# Patient Record
Sex: Male | Born: 1945 | Race: White | Hispanic: No | Marital: Married | Smoking: Former smoker
Health system: Southern US, Community
[De-identification: ages and names within clinical notes are randomized; demographics above are authoritative.]

## PROBLEM LIST (undated history)

## (undated) DIAGNOSIS — I10 Essential (primary) hypertension: Secondary | ICD-10-CM

## (undated) DIAGNOSIS — I4891 Unspecified atrial fibrillation: Secondary | ICD-10-CM

## (undated) DIAGNOSIS — R0600 Dyspnea, unspecified: Secondary | ICD-10-CM

## (undated) DIAGNOSIS — I499 Cardiac arrhythmia, unspecified: Secondary | ICD-10-CM

## (undated) DIAGNOSIS — Z9889 Other specified postprocedural states: Secondary | ICD-10-CM

## (undated) DIAGNOSIS — J45909 Unspecified asthma, uncomplicated: Secondary | ICD-10-CM

## (undated) DIAGNOSIS — F32A Depression, unspecified: Secondary | ICD-10-CM

## (undated) DIAGNOSIS — J449 Chronic obstructive pulmonary disease, unspecified: Secondary | ICD-10-CM

## (undated) DIAGNOSIS — R112 Nausea with vomiting, unspecified: Secondary | ICD-10-CM

## (undated) DIAGNOSIS — R609 Edema, unspecified: Secondary | ICD-10-CM

## (undated) DIAGNOSIS — E878 Other disorders of electrolyte and fluid balance, not elsewhere classified: Secondary | ICD-10-CM

## (undated) DIAGNOSIS — C61 Malignant neoplasm of prostate: Secondary | ICD-10-CM

## (undated) DIAGNOSIS — F419 Anxiety disorder, unspecified: Secondary | ICD-10-CM

## (undated) DIAGNOSIS — E119 Type 2 diabetes mellitus without complications: Secondary | ICD-10-CM

## (undated) DIAGNOSIS — I509 Heart failure, unspecified: Secondary | ICD-10-CM

## (undated) DIAGNOSIS — K219 Gastro-esophageal reflux disease without esophagitis: Secondary | ICD-10-CM

## (undated) DIAGNOSIS — M1990 Unspecified osteoarthritis, unspecified site: Secondary | ICD-10-CM

## (undated) HISTORY — DX: Type 2 diabetes mellitus without complications: E11.9

## (undated) HISTORY — DX: Anxiety disorder, unspecified: F41.9

## (undated) HISTORY — DX: Unspecified asthma, uncomplicated: J45.909

## (undated) HISTORY — DX: Cardiac arrhythmia, unspecified: I49.9

## (undated) HISTORY — PX: BACK SURGERY: SHX140

## (undated) HISTORY — DX: Unspecified atrial fibrillation: I48.91

## (undated) HISTORY — DX: Gastro-esophageal reflux disease without esophagitis: K21.9

## (undated) HISTORY — DX: Depression, unspecified: F32.A

---
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
## 2000-09-23 ENCOUNTER — Ambulatory Visit (HOSPITAL_COMMUNITY): Admission: RE | Admit: 2000-09-23 | Discharge: 2000-09-23 | Payer: Self-pay | Admitting: Family Medicine

## 2000-09-23 ENCOUNTER — Encounter: Payer: Self-pay | Admitting: Family Medicine

## 2002-12-01 ENCOUNTER — Ambulatory Visit (HOSPITAL_COMMUNITY): Admission: RE | Admit: 2002-12-01 | Discharge: 2002-12-01 | Payer: Self-pay | Admitting: Family Medicine

## 2006-02-02 ENCOUNTER — Ambulatory Visit (HOSPITAL_COMMUNITY): Admission: RE | Admit: 2006-02-02 | Discharge: 2006-02-02 | Payer: Self-pay | Admitting: Family Medicine

## 2006-12-30 ENCOUNTER — Ambulatory Visit (HOSPITAL_COMMUNITY): Admission: RE | Admit: 2006-12-30 | Discharge: 2006-12-30 | Payer: Self-pay | Admitting: Family Medicine

## 2010-10-28 LAB — CREATININE, SERUM
Creatinine, Ser: 1.32
GFR calc Af Amer: >60
GFR calc non Af Amer: 55 — ABNORMAL LOW

## 2011-06-02 ENCOUNTER — Other Ambulatory Visit (HOSPITAL_COMMUNITY): Payer: Self-pay | Admitting: Internal Medicine

## 2011-06-02 ENCOUNTER — Ambulatory Visit (HOSPITAL_COMMUNITY)
Admission: RE | Admit: 2011-06-02 | Discharge: 2011-06-02 | Disposition: A | Discharge status: Home / Self Care | Payer: Medicare Other | Source: Ambulatory Visit | Attending: Internal Medicine | Admitting: Internal Medicine

## 2011-06-02 DIAGNOSIS — X58XXXA Exposure to other specified factors, initial encounter: Secondary | ICD-10-CM | POA: Insufficient documentation

## 2011-06-02 DIAGNOSIS — R0781 Pleurodynia: Secondary | ICD-10-CM

## 2011-06-02 DIAGNOSIS — S2239XA Fracture of one rib, unspecified side, initial encounter for closed fracture: Secondary | ICD-10-CM | POA: Insufficient documentation

## 2011-06-02 DIAGNOSIS — J4489 Other specified chronic obstructive pulmonary disease: Secondary | ICD-10-CM | POA: Insufficient documentation

## 2011-06-02 DIAGNOSIS — J449 Chronic obstructive pulmonary disease, unspecified: Secondary | ICD-10-CM | POA: Insufficient documentation

## 2014-05-05 DIAGNOSIS — E782 Mixed hyperlipidemia: Secondary | ICD-10-CM | POA: Diagnosis not present

## 2014-05-05 DIAGNOSIS — I10 Essential (primary) hypertension: Secondary | ICD-10-CM | POA: Diagnosis not present

## 2014-05-05 DIAGNOSIS — E119 Type 2 diabetes mellitus without complications: Secondary | ICD-10-CM | POA: Diagnosis not present

## 2014-05-08 DIAGNOSIS — E119 Type 2 diabetes mellitus without complications: Secondary | ICD-10-CM | POA: Diagnosis not present

## 2014-05-08 DIAGNOSIS — J449 Chronic obstructive pulmonary disease, unspecified: Secondary | ICD-10-CM | POA: Diagnosis not present

## 2014-05-08 DIAGNOSIS — E782 Mixed hyperlipidemia: Secondary | ICD-10-CM | POA: Diagnosis not present

## 2014-05-08 DIAGNOSIS — I10 Essential (primary) hypertension: Secondary | ICD-10-CM | POA: Diagnosis not present

## 2014-08-17 DIAGNOSIS — I10 Essential (primary) hypertension: Secondary | ICD-10-CM | POA: Diagnosis not present

## 2014-08-17 DIAGNOSIS — E119 Type 2 diabetes mellitus without complications: Secondary | ICD-10-CM | POA: Diagnosis not present

## 2014-08-17 DIAGNOSIS — E782 Mixed hyperlipidemia: Secondary | ICD-10-CM | POA: Diagnosis not present

## 2014-08-22 DIAGNOSIS — E785 Hyperlipidemia, unspecified: Secondary | ICD-10-CM | POA: Diagnosis not present

## 2014-08-22 DIAGNOSIS — I10 Essential (primary) hypertension: Secondary | ICD-10-CM | POA: Diagnosis not present

## 2014-08-22 DIAGNOSIS — J449 Chronic obstructive pulmonary disease, unspecified: Secondary | ICD-10-CM | POA: Diagnosis not present

## 2014-08-22 DIAGNOSIS — E119 Type 2 diabetes mellitus without complications: Secondary | ICD-10-CM | POA: Diagnosis not present

## 2014-11-27 DIAGNOSIS — I10 Essential (primary) hypertension: Secondary | ICD-10-CM | POA: Diagnosis not present

## 2014-11-27 DIAGNOSIS — E119 Type 2 diabetes mellitus without complications: Secondary | ICD-10-CM | POA: Diagnosis not present

## 2014-11-27 DIAGNOSIS — E782 Mixed hyperlipidemia: Secondary | ICD-10-CM | POA: Diagnosis not present

## 2014-11-29 DIAGNOSIS — J449 Chronic obstructive pulmonary disease, unspecified: Secondary | ICD-10-CM | POA: Diagnosis not present

## 2014-11-29 DIAGNOSIS — E119 Type 2 diabetes mellitus without complications: Secondary | ICD-10-CM | POA: Diagnosis not present

## 2014-11-29 DIAGNOSIS — I10 Essential (primary) hypertension: Secondary | ICD-10-CM | POA: Diagnosis not present

## 2014-11-29 DIAGNOSIS — E782 Mixed hyperlipidemia: Secondary | ICD-10-CM | POA: Diagnosis not present

## 2014-11-29 DIAGNOSIS — Z23 Encounter for immunization: Secondary | ICD-10-CM | POA: Diagnosis not present

## 2015-03-27 DIAGNOSIS — E782 Mixed hyperlipidemia: Secondary | ICD-10-CM | POA: Diagnosis not present

## 2015-03-27 DIAGNOSIS — I10 Essential (primary) hypertension: Secondary | ICD-10-CM | POA: Diagnosis not present

## 2015-03-27 DIAGNOSIS — E119 Type 2 diabetes mellitus without complications: Secondary | ICD-10-CM | POA: Diagnosis not present

## 2015-03-29 DIAGNOSIS — E119 Type 2 diabetes mellitus without complications: Secondary | ICD-10-CM | POA: Diagnosis not present

## 2015-03-29 DIAGNOSIS — I10 Essential (primary) hypertension: Secondary | ICD-10-CM | POA: Diagnosis not present

## 2015-03-29 DIAGNOSIS — J449 Chronic obstructive pulmonary disease, unspecified: Secondary | ICD-10-CM | POA: Diagnosis not present

## 2015-03-29 DIAGNOSIS — R944 Abnormal results of kidney function studies: Secondary | ICD-10-CM | POA: Diagnosis not present

## 2015-04-26 DIAGNOSIS — I10 Essential (primary) hypertension: Secondary | ICD-10-CM | POA: Diagnosis not present

## 2015-05-29 DIAGNOSIS — J441 Chronic obstructive pulmonary disease with (acute) exacerbation: Secondary | ICD-10-CM | POA: Diagnosis not present

## 2015-05-29 DIAGNOSIS — J060 Acute laryngopharyngitis: Secondary | ICD-10-CM | POA: Diagnosis not present

## 2015-05-31 DIAGNOSIS — M549 Dorsalgia, unspecified: Secondary | ICD-10-CM | POA: Diagnosis not present

## 2015-05-31 DIAGNOSIS — M47816 Spondylosis without myelopathy or radiculopathy, lumbar region: Secondary | ICD-10-CM | POA: Diagnosis not present

## 2015-05-31 DIAGNOSIS — M4125 Other idiopathic scoliosis, thoracolumbar region: Secondary | ICD-10-CM | POA: Diagnosis not present

## 2015-06-04 DIAGNOSIS — M545 Low back pain: Secondary | ICD-10-CM | POA: Diagnosis not present

## 2015-06-04 DIAGNOSIS — M6281 Muscle weakness (generalized): Secondary | ICD-10-CM | POA: Diagnosis not present

## 2015-06-06 DIAGNOSIS — M545 Low back pain: Secondary | ICD-10-CM | POA: Diagnosis not present

## 2015-06-06 DIAGNOSIS — M6281 Muscle weakness (generalized): Secondary | ICD-10-CM | POA: Diagnosis not present

## 2015-06-13 DIAGNOSIS — M545 Low back pain: Secondary | ICD-10-CM | POA: Diagnosis not present

## 2015-06-13 DIAGNOSIS — M6281 Muscle weakness (generalized): Secondary | ICD-10-CM | POA: Diagnosis not present

## 2015-06-14 DIAGNOSIS — M545 Low back pain: Secondary | ICD-10-CM | POA: Diagnosis not present

## 2015-06-14 DIAGNOSIS — M6281 Muscle weakness (generalized): Secondary | ICD-10-CM | POA: Diagnosis not present

## 2015-06-20 DIAGNOSIS — M6281 Muscle weakness (generalized): Secondary | ICD-10-CM | POA: Diagnosis not present

## 2015-06-20 DIAGNOSIS — M545 Low back pain: Secondary | ICD-10-CM | POA: Diagnosis not present

## 2015-06-22 DIAGNOSIS — M545 Low back pain: Secondary | ICD-10-CM | POA: Diagnosis not present

## 2015-06-22 DIAGNOSIS — M6281 Muscle weakness (generalized): Secondary | ICD-10-CM | POA: Diagnosis not present

## 2015-06-25 DIAGNOSIS — M545 Low back pain: Secondary | ICD-10-CM | POA: Diagnosis not present

## 2015-06-25 DIAGNOSIS — M6281 Muscle weakness (generalized): Secondary | ICD-10-CM | POA: Diagnosis not present

## 2015-06-27 DIAGNOSIS — M6281 Muscle weakness (generalized): Secondary | ICD-10-CM | POA: Diagnosis not present

## 2015-06-27 DIAGNOSIS — M545 Low back pain: Secondary | ICD-10-CM | POA: Diagnosis not present

## 2015-07-02 DIAGNOSIS — M545 Low back pain: Secondary | ICD-10-CM | POA: Diagnosis not present

## 2015-07-02 DIAGNOSIS — M6281 Muscle weakness (generalized): Secondary | ICD-10-CM | POA: Diagnosis not present

## 2015-07-04 DIAGNOSIS — M6281 Muscle weakness (generalized): Secondary | ICD-10-CM | POA: Diagnosis not present

## 2015-07-04 DIAGNOSIS — M545 Low back pain: Secondary | ICD-10-CM | POA: Diagnosis not present

## 2015-07-05 DIAGNOSIS — M4125 Other idiopathic scoliosis, thoracolumbar region: Secondary | ICD-10-CM | POA: Diagnosis not present

## 2015-07-05 DIAGNOSIS — M47816 Spondylosis without myelopathy or radiculopathy, lumbar region: Secondary | ICD-10-CM | POA: Diagnosis not present

## 2015-07-17 DIAGNOSIS — M47816 Spondylosis without myelopathy or radiculopathy, lumbar region: Secondary | ICD-10-CM | POA: Diagnosis not present

## 2015-07-19 DIAGNOSIS — E119 Type 2 diabetes mellitus without complications: Secondary | ICD-10-CM | POA: Diagnosis not present

## 2015-07-19 DIAGNOSIS — E782 Mixed hyperlipidemia: Secondary | ICD-10-CM | POA: Diagnosis not present

## 2015-07-30 DIAGNOSIS — J449 Chronic obstructive pulmonary disease, unspecified: Secondary | ICD-10-CM | POA: Diagnosis not present

## 2015-07-30 DIAGNOSIS — E782 Mixed hyperlipidemia: Secondary | ICD-10-CM | POA: Diagnosis not present

## 2015-07-30 DIAGNOSIS — I10 Essential (primary) hypertension: Secondary | ICD-10-CM | POA: Diagnosis not present

## 2015-07-30 DIAGNOSIS — E119 Type 2 diabetes mellitus without complications: Secondary | ICD-10-CM | POA: Diagnosis not present

## 2015-08-06 DIAGNOSIS — M47816 Spondylosis without myelopathy or radiculopathy, lumbar region: Secondary | ICD-10-CM | POA: Diagnosis not present

## 2015-08-08 DIAGNOSIS — M1712 Unilateral primary osteoarthritis, left knee: Secondary | ICD-10-CM | POA: Diagnosis not present

## 2015-10-10 DIAGNOSIS — M47816 Spondylosis without myelopathy or radiculopathy, lumbar region: Secondary | ICD-10-CM | POA: Diagnosis not present

## 2015-10-10 DIAGNOSIS — M47817 Spondylosis without myelopathy or radiculopathy, lumbosacral region: Secondary | ICD-10-CM | POA: Diagnosis not present

## 2015-11-24 DIAGNOSIS — Z23 Encounter for immunization: Secondary | ICD-10-CM | POA: Diagnosis not present

## 2015-11-26 DIAGNOSIS — E782 Mixed hyperlipidemia: Secondary | ICD-10-CM | POA: Diagnosis not present

## 2015-11-26 DIAGNOSIS — I10 Essential (primary) hypertension: Secondary | ICD-10-CM | POA: Diagnosis not present

## 2015-11-26 DIAGNOSIS — E119 Type 2 diabetes mellitus without complications: Secondary | ICD-10-CM | POA: Diagnosis not present

## 2015-11-27 DIAGNOSIS — Z0001 Encounter for general adult medical examination with abnormal findings: Secondary | ICD-10-CM | POA: Diagnosis not present

## 2015-11-27 DIAGNOSIS — E782 Mixed hyperlipidemia: Secondary | ICD-10-CM | POA: Diagnosis not present

## 2015-11-27 DIAGNOSIS — R944 Abnormal results of kidney function studies: Secondary | ICD-10-CM | POA: Diagnosis not present

## 2015-11-27 DIAGNOSIS — J449 Chronic obstructive pulmonary disease, unspecified: Secondary | ICD-10-CM | POA: Diagnosis not present

## 2015-11-27 DIAGNOSIS — I10 Essential (primary) hypertension: Secondary | ICD-10-CM | POA: Diagnosis not present

## 2015-11-27 DIAGNOSIS — E119 Type 2 diabetes mellitus without complications: Secondary | ICD-10-CM | POA: Diagnosis not present

## 2016-03-31 DIAGNOSIS — I10 Essential (primary) hypertension: Secondary | ICD-10-CM | POA: Diagnosis not present

## 2016-03-31 DIAGNOSIS — Z1159 Encounter for screening for other viral diseases: Secondary | ICD-10-CM | POA: Diagnosis not present

## 2016-03-31 DIAGNOSIS — E119 Type 2 diabetes mellitus without complications: Secondary | ICD-10-CM | POA: Diagnosis not present

## 2016-04-02 DIAGNOSIS — R944 Abnormal results of kidney function studies: Secondary | ICD-10-CM | POA: Diagnosis not present

## 2016-04-02 DIAGNOSIS — I10 Essential (primary) hypertension: Secondary | ICD-10-CM | POA: Diagnosis not present

## 2016-04-02 DIAGNOSIS — J449 Chronic obstructive pulmonary disease, unspecified: Secondary | ICD-10-CM | POA: Diagnosis not present

## 2016-04-02 DIAGNOSIS — E782 Mixed hyperlipidemia: Secondary | ICD-10-CM | POA: Diagnosis not present

## 2016-04-02 DIAGNOSIS — E119 Type 2 diabetes mellitus without complications: Secondary | ICD-10-CM | POA: Diagnosis not present

## 2016-04-09 DIAGNOSIS — H25813 Combined forms of age-related cataract, bilateral: Secondary | ICD-10-CM | POA: Diagnosis not present

## 2016-04-09 DIAGNOSIS — H52223 Regular astigmatism, bilateral: Secondary | ICD-10-CM | POA: Diagnosis not present

## 2016-04-09 DIAGNOSIS — H5203 Hypermetropia, bilateral: Secondary | ICD-10-CM | POA: Diagnosis not present

## 2016-04-09 DIAGNOSIS — H524 Presbyopia: Secondary | ICD-10-CM | POA: Diagnosis not present

## 2016-07-02 DIAGNOSIS — E119 Type 2 diabetes mellitus without complications: Secondary | ICD-10-CM | POA: Diagnosis not present

## 2016-07-02 DIAGNOSIS — E782 Mixed hyperlipidemia: Secondary | ICD-10-CM | POA: Diagnosis not present

## 2016-07-04 DIAGNOSIS — J449 Chronic obstructive pulmonary disease, unspecified: Secondary | ICD-10-CM | POA: Diagnosis not present

## 2016-07-04 DIAGNOSIS — R944 Abnormal results of kidney function studies: Secondary | ICD-10-CM | POA: Diagnosis not present

## 2016-07-04 DIAGNOSIS — E782 Mixed hyperlipidemia: Secondary | ICD-10-CM | POA: Diagnosis not present

## 2016-07-04 DIAGNOSIS — E119 Type 2 diabetes mellitus without complications: Secondary | ICD-10-CM | POA: Diagnosis not present

## 2016-07-04 DIAGNOSIS — I10 Essential (primary) hypertension: Secondary | ICD-10-CM | POA: Diagnosis not present

## 2016-10-07 DIAGNOSIS — E119 Type 2 diabetes mellitus without complications: Secondary | ICD-10-CM | POA: Diagnosis not present

## 2016-10-07 DIAGNOSIS — I10 Essential (primary) hypertension: Secondary | ICD-10-CM | POA: Diagnosis not present

## 2016-10-15 DIAGNOSIS — I10 Essential (primary) hypertension: Secondary | ICD-10-CM | POA: Diagnosis not present

## 2016-10-15 DIAGNOSIS — R944 Abnormal results of kidney function studies: Secondary | ICD-10-CM | POA: Diagnosis not present

## 2016-10-15 DIAGNOSIS — E782 Mixed hyperlipidemia: Secondary | ICD-10-CM | POA: Diagnosis not present

## 2016-10-15 DIAGNOSIS — Z23 Encounter for immunization: Secondary | ICD-10-CM | POA: Diagnosis not present

## 2016-10-15 DIAGNOSIS — E119 Type 2 diabetes mellitus without complications: Secondary | ICD-10-CM | POA: Diagnosis not present

## 2016-11-21 DIAGNOSIS — J441 Chronic obstructive pulmonary disease with (acute) exacerbation: Secondary | ICD-10-CM | POA: Diagnosis not present

## 2016-11-26 DIAGNOSIS — I10 Essential (primary) hypertension: Secondary | ICD-10-CM | POA: Diagnosis not present

## 2017-02-12 DIAGNOSIS — E6609 Other obesity due to excess calories: Secondary | ICD-10-CM | POA: Diagnosis not present

## 2017-02-12 DIAGNOSIS — J449 Chronic obstructive pulmonary disease, unspecified: Secondary | ICD-10-CM | POA: Diagnosis not present

## 2017-02-12 DIAGNOSIS — R69 Illness, unspecified: Secondary | ICD-10-CM | POA: Diagnosis not present

## 2017-02-12 DIAGNOSIS — E119 Type 2 diabetes mellitus without complications: Secondary | ICD-10-CM | POA: Diagnosis not present

## 2017-02-12 DIAGNOSIS — R7301 Impaired fasting glucose: Secondary | ICD-10-CM | POA: Diagnosis not present

## 2017-02-12 DIAGNOSIS — I10 Essential (primary) hypertension: Secondary | ICD-10-CM | POA: Diagnosis not present

## 2017-02-12 DIAGNOSIS — Z7189 Other specified counseling: Secondary | ICD-10-CM | POA: Diagnosis not present

## 2017-02-12 DIAGNOSIS — G8929 Other chronic pain: Secondary | ICD-10-CM | POA: Diagnosis not present

## 2017-02-12 DIAGNOSIS — Z6830 Body mass index (BMI) 30.0-30.9, adult: Secondary | ICD-10-CM | POA: Diagnosis not present

## 2017-02-12 DIAGNOSIS — E782 Mixed hyperlipidemia: Secondary | ICD-10-CM | POA: Diagnosis not present

## 2017-02-12 DIAGNOSIS — R944 Abnormal results of kidney function studies: Secondary | ICD-10-CM | POA: Diagnosis not present

## 2017-02-16 DIAGNOSIS — E119 Type 2 diabetes mellitus without complications: Secondary | ICD-10-CM | POA: Diagnosis not present

## 2017-02-16 DIAGNOSIS — J449 Chronic obstructive pulmonary disease, unspecified: Secondary | ICD-10-CM | POA: Diagnosis not present

## 2017-02-16 DIAGNOSIS — E6609 Other obesity due to excess calories: Secondary | ICD-10-CM | POA: Diagnosis not present

## 2017-02-16 DIAGNOSIS — Z6830 Body mass index (BMI) 30.0-30.9, adult: Secondary | ICD-10-CM | POA: Diagnosis not present

## 2017-02-16 DIAGNOSIS — R7301 Impaired fasting glucose: Secondary | ICD-10-CM | POA: Diagnosis not present

## 2017-02-16 DIAGNOSIS — I10 Essential (primary) hypertension: Secondary | ICD-10-CM | POA: Diagnosis not present

## 2017-02-16 DIAGNOSIS — Z7189 Other specified counseling: Secondary | ICD-10-CM | POA: Diagnosis not present

## 2017-02-16 DIAGNOSIS — R944 Abnormal results of kidney function studies: Secondary | ICD-10-CM | POA: Diagnosis not present

## 2017-02-16 DIAGNOSIS — E782 Mixed hyperlipidemia: Secondary | ICD-10-CM | POA: Diagnosis not present

## 2017-02-16 DIAGNOSIS — G8929 Other chronic pain: Secondary | ICD-10-CM | POA: Diagnosis not present

## 2017-03-02 DIAGNOSIS — I10 Essential (primary) hypertension: Secondary | ICD-10-CM | POA: Diagnosis not present

## 2017-03-26 DIAGNOSIS — R05 Cough: Secondary | ICD-10-CM | POA: Diagnosis not present

## 2017-03-26 DIAGNOSIS — J449 Chronic obstructive pulmonary disease, unspecified: Secondary | ICD-10-CM | POA: Diagnosis not present

## 2017-04-14 DIAGNOSIS — R05 Cough: Secondary | ICD-10-CM | POA: Diagnosis not present

## 2017-04-14 DIAGNOSIS — J449 Chronic obstructive pulmonary disease, unspecified: Secondary | ICD-10-CM | POA: Diagnosis not present

## 2017-04-14 DIAGNOSIS — J441 Chronic obstructive pulmonary disease with (acute) exacerbation: Secondary | ICD-10-CM | POA: Diagnosis not present

## 2017-08-13 DIAGNOSIS — J441 Chronic obstructive pulmonary disease with (acute) exacerbation: Secondary | ICD-10-CM | POA: Diagnosis not present

## 2017-08-14 DIAGNOSIS — J449 Chronic obstructive pulmonary disease, unspecified: Secondary | ICD-10-CM | POA: Diagnosis not present

## 2017-08-28 DIAGNOSIS — E119 Type 2 diabetes mellitus without complications: Secondary | ICD-10-CM | POA: Diagnosis not present

## 2017-08-28 DIAGNOSIS — E782 Mixed hyperlipidemia: Secondary | ICD-10-CM | POA: Diagnosis not present

## 2017-08-28 DIAGNOSIS — I10 Essential (primary) hypertension: Secondary | ICD-10-CM | POA: Diagnosis not present

## 2017-08-28 DIAGNOSIS — J441 Chronic obstructive pulmonary disease with (acute) exacerbation: Secondary | ICD-10-CM | POA: Diagnosis not present

## 2017-08-28 DIAGNOSIS — R944 Abnormal results of kidney function studies: Secondary | ICD-10-CM | POA: Diagnosis not present

## 2017-09-02 DIAGNOSIS — R944 Abnormal results of kidney function studies: Secondary | ICD-10-CM | POA: Diagnosis not present

## 2017-09-02 DIAGNOSIS — E782 Mixed hyperlipidemia: Secondary | ICD-10-CM | POA: Diagnosis not present

## 2017-09-02 DIAGNOSIS — R945 Abnormal results of liver function studies: Secondary | ICD-10-CM | POA: Diagnosis not present

## 2017-09-02 DIAGNOSIS — I10 Essential (primary) hypertension: Secondary | ICD-10-CM | POA: Diagnosis not present

## 2017-09-02 DIAGNOSIS — E1169 Type 2 diabetes mellitus with other specified complication: Secondary | ICD-10-CM | POA: Diagnosis not present

## 2017-09-07 DIAGNOSIS — J449 Chronic obstructive pulmonary disease, unspecified: Secondary | ICD-10-CM | POA: Diagnosis not present

## 2017-09-14 DIAGNOSIS — J449 Chronic obstructive pulmonary disease, unspecified: Secondary | ICD-10-CM | POA: Diagnosis not present

## 2017-09-30 DIAGNOSIS — R945 Abnormal results of liver function studies: Secondary | ICD-10-CM | POA: Diagnosis not present

## 2017-09-30 DIAGNOSIS — E119 Type 2 diabetes mellitus without complications: Secondary | ICD-10-CM | POA: Diagnosis not present

## 2017-09-30 DIAGNOSIS — Z0001 Encounter for general adult medical examination with abnormal findings: Secondary | ICD-10-CM | POA: Diagnosis not present

## 2017-09-30 DIAGNOSIS — R944 Abnormal results of kidney function studies: Secondary | ICD-10-CM | POA: Diagnosis not present

## 2017-09-30 DIAGNOSIS — Z23 Encounter for immunization: Secondary | ICD-10-CM | POA: Diagnosis not present

## 2017-09-30 DIAGNOSIS — I10 Essential (primary) hypertension: Secondary | ICD-10-CM | POA: Diagnosis not present

## 2017-09-30 DIAGNOSIS — J449 Chronic obstructive pulmonary disease, unspecified: Secondary | ICD-10-CM | POA: Diagnosis not present

## 2017-09-30 DIAGNOSIS — R05 Cough: Secondary | ICD-10-CM | POA: Diagnosis not present

## 2017-10-05 ENCOUNTER — Other Ambulatory Visit (HOSPITAL_COMMUNITY): Payer: Self-pay | Admitting: Internal Medicine

## 2017-10-05 DIAGNOSIS — R945 Abnormal results of liver function studies: Secondary | ICD-10-CM

## 2017-10-06 DIAGNOSIS — J449 Chronic obstructive pulmonary disease, unspecified: Secondary | ICD-10-CM | POA: Diagnosis not present

## 2017-10-15 DIAGNOSIS — J449 Chronic obstructive pulmonary disease, unspecified: Secondary | ICD-10-CM | POA: Diagnosis not present

## 2017-10-20 ENCOUNTER — Ambulatory Visit (HOSPITAL_COMMUNITY)
Admission: RE | Admit: 2017-10-20 | Discharge: 2017-10-20 | Disposition: A | Discharge status: Home / Self Care | Payer: Medicare Other | Source: Ambulatory Visit | Attending: Internal Medicine | Admitting: Internal Medicine

## 2017-10-20 DIAGNOSIS — K8020 Calculus of gallbladder without cholecystitis without obstruction: Secondary | ICD-10-CM | POA: Diagnosis not present

## 2017-10-20 DIAGNOSIS — I700 Atherosclerosis of aorta: Secondary | ICD-10-CM | POA: Diagnosis not present

## 2017-10-20 DIAGNOSIS — E278 Other specified disorders of adrenal gland: Secondary | ICD-10-CM | POA: Insufficient documentation

## 2017-10-20 DIAGNOSIS — R945 Abnormal results of liver function studies: Secondary | ICD-10-CM | POA: Diagnosis not present

## 2017-10-20 LAB — POCT I-STAT CREATININE: Creatinine, Ser: 1 mg/dL (ref 0.61–1.24)

## 2017-10-20 MED ORDER — IOPAMIDOL (ISOVUE-300) INJECTION 61%
100.0000 mL | Freq: Once | INTRAVENOUS | Status: AC | PRN
  Administered 2017-10-20: 100 mL via INTRAVENOUS

## 2017-10-27 ENCOUNTER — Other Ambulatory Visit (HOSPITAL_COMMUNITY): Payer: Self-pay | Admitting: Internal Medicine

## 2017-10-27 DIAGNOSIS — E279 Disorder of adrenal gland, unspecified: Principal | ICD-10-CM

## 2017-10-27 DIAGNOSIS — E278 Other specified disorders of adrenal gland: Secondary | ICD-10-CM

## 2017-10-29 ENCOUNTER — Ambulatory Visit (HOSPITAL_COMMUNITY)
Admission: RE | Admit: 2017-10-29 | Discharge: 2017-10-29 | Disposition: A | Discharge status: Home / Self Care | Payer: Medicare Other | Source: Ambulatory Visit | Attending: Internal Medicine | Admitting: Internal Medicine

## 2017-10-29 ENCOUNTER — Encounter (HOSPITAL_COMMUNITY): Payer: Self-pay

## 2017-10-29 DIAGNOSIS — E279 Disorder of adrenal gland, unspecified: Principal | ICD-10-CM

## 2017-10-29 DIAGNOSIS — E278 Other specified disorders of adrenal gland: Secondary | ICD-10-CM

## 2017-11-02 ENCOUNTER — Other Ambulatory Visit: Payer: Self-pay | Admitting: Internal Medicine

## 2017-11-02 DIAGNOSIS — E278 Other specified disorders of adrenal gland: Secondary | ICD-10-CM

## 2017-11-02 DIAGNOSIS — E279 Disorder of adrenal gland, unspecified: Principal | ICD-10-CM

## 2017-11-05 DIAGNOSIS — J449 Chronic obstructive pulmonary disease, unspecified: Secondary | ICD-10-CM | POA: Diagnosis not present

## 2017-11-08 ENCOUNTER — Ambulatory Visit
Admission: RE | Admit: 2017-11-08 | Discharge: 2017-11-08 | Disposition: A | Discharge status: Home / Self Care | Payer: Medicare Other | Source: Ambulatory Visit | Attending: Internal Medicine | Admitting: Internal Medicine

## 2017-11-08 DIAGNOSIS — K8020 Calculus of gallbladder without cholecystitis without obstruction: Secondary | ICD-10-CM | POA: Diagnosis not present

## 2017-11-08 DIAGNOSIS — E278 Other specified disorders of adrenal gland: Secondary | ICD-10-CM

## 2017-11-08 DIAGNOSIS — E279 Disorder of adrenal gland, unspecified: Principal | ICD-10-CM

## 2017-11-08 MED ORDER — GADOBENATE DIMEGLUMINE 529 MG/ML IV SOLN
18.0000 mL | Freq: Once | INTRAVENOUS | Status: AC | PRN
  Administered 2017-11-08: 18 mL via INTRAVENOUS

## 2017-11-14 DIAGNOSIS — J449 Chronic obstructive pulmonary disease, unspecified: Secondary | ICD-10-CM | POA: Diagnosis not present

## 2017-12-04 DIAGNOSIS — J449 Chronic obstructive pulmonary disease, unspecified: Secondary | ICD-10-CM | POA: Diagnosis not present

## 2017-12-15 DIAGNOSIS — J449 Chronic obstructive pulmonary disease, unspecified: Secondary | ICD-10-CM | POA: Diagnosis not present

## 2017-12-28 DIAGNOSIS — E1169 Type 2 diabetes mellitus with other specified complication: Secondary | ICD-10-CM | POA: Diagnosis not present

## 2017-12-28 DIAGNOSIS — I10 Essential (primary) hypertension: Secondary | ICD-10-CM | POA: Diagnosis not present

## 2017-12-28 DIAGNOSIS — E782 Mixed hyperlipidemia: Secondary | ICD-10-CM | POA: Diagnosis not present

## 2017-12-28 DIAGNOSIS — R944 Abnormal results of kidney function studies: Secondary | ICD-10-CM | POA: Diagnosis not present

## 2018-01-05 DIAGNOSIS — E1169 Type 2 diabetes mellitus with other specified complication: Secondary | ICD-10-CM | POA: Diagnosis not present

## 2018-01-05 DIAGNOSIS — E782 Mixed hyperlipidemia: Secondary | ICD-10-CM | POA: Diagnosis not present

## 2018-01-05 DIAGNOSIS — I10 Essential (primary) hypertension: Secondary | ICD-10-CM | POA: Diagnosis not present

## 2018-01-05 DIAGNOSIS — R7301 Impaired fasting glucose: Secondary | ICD-10-CM | POA: Diagnosis not present

## 2018-01-07 DIAGNOSIS — J060 Acute laryngopharyngitis: Secondary | ICD-10-CM | POA: Diagnosis not present

## 2018-01-11 DIAGNOSIS — J449 Chronic obstructive pulmonary disease, unspecified: Secondary | ICD-10-CM | POA: Diagnosis not present

## 2018-01-14 DIAGNOSIS — J449 Chronic obstructive pulmonary disease, unspecified: Secondary | ICD-10-CM | POA: Diagnosis not present

## 2018-02-11 DIAGNOSIS — J449 Chronic obstructive pulmonary disease, unspecified: Secondary | ICD-10-CM | POA: Diagnosis not present

## 2018-02-14 DIAGNOSIS — J449 Chronic obstructive pulmonary disease, unspecified: Secondary | ICD-10-CM | POA: Diagnosis not present

## 2018-03-16 DIAGNOSIS — J449 Chronic obstructive pulmonary disease, unspecified: Secondary | ICD-10-CM | POA: Diagnosis not present

## 2018-03-17 DIAGNOSIS — J449 Chronic obstructive pulmonary disease, unspecified: Secondary | ICD-10-CM | POA: Diagnosis not present

## 2018-04-15 DIAGNOSIS — J449 Chronic obstructive pulmonary disease, unspecified: Secondary | ICD-10-CM | POA: Diagnosis not present

## 2018-05-14 DIAGNOSIS — J449 Chronic obstructive pulmonary disease, unspecified: Secondary | ICD-10-CM | POA: Diagnosis not present

## 2018-05-16 DIAGNOSIS — J449 Chronic obstructive pulmonary disease, unspecified: Secondary | ICD-10-CM | POA: Diagnosis not present

## 2018-05-18 DIAGNOSIS — E1169 Type 2 diabetes mellitus with other specified complication: Secondary | ICD-10-CM | POA: Diagnosis not present

## 2018-05-18 DIAGNOSIS — I10 Essential (primary) hypertension: Secondary | ICD-10-CM | POA: Diagnosis not present

## 2018-05-18 DIAGNOSIS — G894 Chronic pain syndrome: Secondary | ICD-10-CM | POA: Diagnosis not present

## 2018-05-18 DIAGNOSIS — E782 Mixed hyperlipidemia: Secondary | ICD-10-CM | POA: Diagnosis not present

## 2018-05-28 DIAGNOSIS — Z0000 Encounter for general adult medical examination without abnormal findings: Secondary | ICD-10-CM | POA: Diagnosis not present

## 2018-06-26 DIAGNOSIS — J449 Chronic obstructive pulmonary disease, unspecified: Secondary | ICD-10-CM | POA: Diagnosis not present

## 2018-12-08 ENCOUNTER — Other Ambulatory Visit: Payer: Self-pay

## 2018-12-08 NOTE — Patient Outreach (Signed)
[facility]) Care Management  12/08/2018  JABULANI FLEXER 1945-03-08 [id number]   Medication Adherence call to Mr. Zylan Marinaro [facility] Identifiers Verify spoke with patient he is past due on Atorvastatin 20 mg,patient explain he takes 1 tablet daily,patient was not home he has medication but will check and if needed he will order it. Mr. Geralyn Corwin is showing past due under [facility].   [place] Management Direct Dial [phone]  Fax [phone] [email or web address]

## 2018-12-14 DIAGNOSIS — E119 Type 2 diabetes mellitus without complications: Secondary | ICD-10-CM | POA: Diagnosis not present

## 2018-12-14 DIAGNOSIS — E1169 Type 2 diabetes mellitus with other specified complication: Secondary | ICD-10-CM | POA: Diagnosis not present

## 2018-12-14 DIAGNOSIS — I10 Essential (primary) hypertension: Secondary | ICD-10-CM | POA: Diagnosis not present

## 2018-12-14 DIAGNOSIS — E782 Mixed hyperlipidemia: Secondary | ICD-10-CM | POA: Diagnosis not present

## 2018-12-15 DIAGNOSIS — J449 Chronic obstructive pulmonary disease, unspecified: Secondary | ICD-10-CM | POA: Diagnosis not present

## 2018-12-15 DIAGNOSIS — E782 Mixed hyperlipidemia: Secondary | ICD-10-CM | POA: Diagnosis not present

## 2018-12-15 DIAGNOSIS — E1169 Type 2 diabetes mellitus with other specified complication: Secondary | ICD-10-CM | POA: Diagnosis not present

## 2018-12-15 DIAGNOSIS — R945 Abnormal results of liver function studies: Secondary | ICD-10-CM | POA: Diagnosis not present

## 2018-12-15 DIAGNOSIS — I10 Essential (primary) hypertension: Secondary | ICD-10-CM | POA: Diagnosis not present

## 2019-04-27 DIAGNOSIS — M25551 Pain in right hip: Secondary | ICD-10-CM | POA: Diagnosis not present

## 2019-05-12 ENCOUNTER — Inpatient Hospital Stay (HOSPITAL_COMMUNITY)
Admission: EM | Admit: 2019-05-12 | Discharge: 2019-05-14 | DRG: 310 | Disposition: A | Discharge status: Home / Self Care | Payer: Medicare Other | Attending: Internal Medicine | Admitting: Internal Medicine

## 2019-05-12 ENCOUNTER — Emergency Department (HOSPITAL_COMMUNITY): Payer: Medicare Other

## 2019-05-12 ENCOUNTER — Observation Stay (HOSPITAL_COMMUNITY): Payer: Medicare Other

## 2019-05-12 ENCOUNTER — Encounter (HOSPITAL_COMMUNITY): Payer: Self-pay

## 2019-05-12 ENCOUNTER — Other Ambulatory Visit: Payer: Self-pay

## 2019-05-12 DIAGNOSIS — M25551 Pain in right hip: Secondary | ICD-10-CM | POA: Diagnosis not present

## 2019-05-12 DIAGNOSIS — G8929 Other chronic pain: Secondary | ICD-10-CM | POA: Diagnosis present

## 2019-05-12 DIAGNOSIS — Z20822 Contact with and (suspected) exposure to covid-19: Secondary | ICD-10-CM | POA: Diagnosis present

## 2019-05-12 DIAGNOSIS — M48061 Spinal stenosis, lumbar region without neurogenic claudication: Secondary | ICD-10-CM | POA: Diagnosis present

## 2019-05-12 DIAGNOSIS — Z87891 Personal history of nicotine dependence: Secondary | ICD-10-CM | POA: Diagnosis not present

## 2019-05-12 DIAGNOSIS — Z8249 Family history of ischemic heart disease and other diseases of the circulatory system: Secondary | ICD-10-CM | POA: Diagnosis not present

## 2019-05-12 DIAGNOSIS — E785 Hyperlipidemia, unspecified: Secondary | ICD-10-CM | POA: Diagnosis present

## 2019-05-12 DIAGNOSIS — M549 Dorsalgia, unspecified: Secondary | ICD-10-CM

## 2019-05-12 DIAGNOSIS — Z79899 Other long term (current) drug therapy: Secondary | ICD-10-CM

## 2019-05-12 DIAGNOSIS — Z7189 Other specified counseling: Secondary | ICD-10-CM | POA: Diagnosis not present

## 2019-05-12 DIAGNOSIS — I4891 Unspecified atrial fibrillation: Principal | ICD-10-CM | POA: Diagnosis present

## 2019-05-12 DIAGNOSIS — M4807 Spinal stenosis, lumbosacral region: Secondary | ICD-10-CM | POA: Diagnosis present

## 2019-05-12 DIAGNOSIS — I071 Rheumatic tricuspid insufficiency: Secondary | ICD-10-CM | POA: Diagnosis not present

## 2019-05-12 DIAGNOSIS — J449 Chronic obstructive pulmonary disease, unspecified: Secondary | ICD-10-CM | POA: Diagnosis present

## 2019-05-12 DIAGNOSIS — I10 Essential (primary) hypertension: Secondary | ICD-10-CM | POA: Diagnosis present

## 2019-05-12 DIAGNOSIS — I272 Pulmonary hypertension, unspecified: Secondary | ICD-10-CM | POA: Diagnosis not present

## 2019-05-12 DIAGNOSIS — I340 Nonrheumatic mitral (valve) insufficiency: Secondary | ICD-10-CM | POA: Diagnosis not present

## 2019-05-12 HISTORY — DX: Other disorders of electrolyte and fluid balance, not elsewhere classified: E87.8

## 2019-05-12 HISTORY — DX: Chronic obstructive pulmonary disease, unspecified: J44.9

## 2019-05-12 HISTORY — DX: Unspecified osteoarthritis, unspecified site: M19.90

## 2019-05-12 HISTORY — DX: Essential (primary) hypertension: I10

## 2019-05-12 LAB — TROPONIN I (HIGH SENSITIVITY)
Troponin I (High Sensitivity): 7 ng/L (ref <18)
Troponin I (High Sensitivity): 8 ng/L (ref <18)

## 2019-05-12 LAB — CBC WITH DIFFERENTIAL/PLATELET
Abs Immature Granulocytes: 0.06 10*3/uL (ref 0.00–0.07)
Basophils Absolute: 0.1 10*3/uL (ref 0.0–0.1)
Basophils Relative: 1 %
Eosinophils Absolute: 0.2 10*3/uL (ref 0.0–0.5)
Eosinophils Relative: 2 %
HCT: 45.5 % (ref 39.0–52.0)
Hemoglobin: 14.6 g/dL (ref 13.0–17.0)
Immature Granulocytes: 1 %
Lymphocytes Relative: 10 %
Lymphs Abs: 1.1 10*3/uL (ref 0.7–4.0)
MCH: 31.1 pg (ref 26.0–34.0)
MCHC: 32.1 g/dL (ref 30.0–36.0)
MCV: 97 fL (ref 80.0–100.0)
Monocytes Absolute: 1.2 10*3/uL — ABNORMAL HIGH (ref 0.1–1.0)
Monocytes Relative: 12 %
Neutro Abs: 8.1 10*3/uL — ABNORMAL HIGH (ref 1.7–7.7)
Neutrophils Relative %: 74 %
Platelets: 195 10*3/uL (ref 150–400)
RBC: 4.69 MIL/uL (ref 4.22–5.81)
RDW: 15 % (ref 11.5–15.5)
WBC: 10.7 10*3/uL — ABNORMAL HIGH (ref 4.0–10.5)
nRBC: 0 % (ref 0.0–0.2)

## 2019-05-12 LAB — COMPREHENSIVE METABOLIC PANEL
ALT: 23 U/L (ref 0–44)
AST: 24 U/L (ref 15–41)
Albumin: 3.9 g/dL (ref 3.5–5.0)
Alkaline Phosphatase: 49 U/L (ref 38–126)
Anion gap: 12 (ref 5–15)
BUN: 23 mg/dL (ref 8–23)
CO2: 23 mmol/L (ref 22–32)
Calcium: 9.1 mg/dL (ref 8.9–10.3)
Chloride: 102 mmol/L (ref 98–111)
Creatinine, Ser: 1.1 mg/dL (ref 0.61–1.24)
GFR calc Af Amer: >60 mL/min (ref >60)
GFR calc non Af Amer: >60 mL/min (ref >60)
Glucose, Bld: 105 mg/dL — ABNORMAL HIGH (ref 70–99)
Potassium: 4.1 mmol/L (ref 3.5–5.1)
Sodium: 137 mmol/L (ref 135–145)
Total Bilirubin: 0.8 mg/dL (ref 0.3–1.2)
Total Protein: 7 g/dL (ref 6.5–8.1)

## 2019-05-12 LAB — MAGNESIUM: Magnesium: 2 mg/dL (ref 1.7–2.4)

## 2019-05-12 LAB — RESPIRATORY PANEL BY RT PCR (FLU A&B, COVID)
Influenza A by PCR: NEGATIVE
Influenza B by PCR: NEGATIVE
SARS Coronavirus 2 by RT PCR: NEGATIVE

## 2019-05-12 LAB — PROTIME-INR
INR: 1.2 (ref 0.8–1.2)
Prothrombin Time: 15.2 seconds (ref 11.4–15.2)

## 2019-05-12 LAB — TSH: TSH: 1.764 u[IU]/mL (ref 0.350–4.500)

## 2019-05-12 MED ORDER — ALBUTEROL SULFATE (2.5 MG/3ML) 0.083% IN NEBU: 3.0000 mL | INHALATION_SOLUTION | Freq: Four times a day (QID) | RESPIRATORY_TRACT | Status: DC | PRN

## 2019-05-12 MED ORDER — FLUTICASONE-UMECLIDIN-VILANT 100-62.5-25 MCG/INH IN AEPB
1.0000 | INHALATION_SPRAY | Freq: Every day | RESPIRATORY_TRACT | Status: DC
  Filled 2019-05-12 (×4): qty 1

## 2019-05-12 MED ORDER — ONDANSETRON HCL 4 MG/2ML IJ SOLN: 4.0000 mg | Freq: Four times a day (QID) | INTRAMUSCULAR | Status: DC | PRN

## 2019-05-12 MED ORDER — HYDROCODONE-ACETAMINOPHEN 5-325 MG PO TABS
1.0000 | ORAL_TABLET | Freq: Four times a day (QID) | ORAL | Status: DC | PRN
  Administered 2019-05-13 (×2): 1 via ORAL
  Filled 2019-05-12 (×3): qty 1

## 2019-05-12 MED ORDER — METOPROLOL TARTRATE 5 MG/5ML IV SOLN: 5.0000 mg | INTRAVENOUS | Status: DC | PRN

## 2019-05-12 MED ORDER — APIXABAN 5 MG PO TABS
5.0000 mg | ORAL_TABLET | Freq: Two times a day (BID) | ORAL | Status: DC
  Administered 2019-05-12 – 2019-05-14 (×3): 5 mg via ORAL
  Filled 2019-05-12 (×5): qty 1

## 2019-05-12 MED ORDER — DILTIAZEM HCL-DEXTROSE 125-5 MG/125ML-% IV SOLN (PREMIX)
5.0000 mg/h | INTRAVENOUS | Status: DC
  Administered 2019-05-12: 5 mg/h via INTRAVENOUS
  Filled 2019-05-12: qty 125

## 2019-05-12 MED ORDER — POLYETHYLENE GLYCOL 3350 17 G PO PACK: 17.0000 g | PACK | Freq: Every day | ORAL | Status: DC | PRN

## 2019-05-12 MED ORDER — DILTIAZEM HCL 30 MG PO TABS
30.0000 mg | ORAL_TABLET | Freq: Once | ORAL | Status: AC
  Administered 2019-05-12: 30 mg via ORAL
  Filled 2019-05-12: qty 1

## 2019-05-12 MED ORDER — HYDROCODONE-ACETAMINOPHEN 5-325 MG PO TABS
1.0000 | ORAL_TABLET | Freq: Once | ORAL | Status: AC
  Administered 2019-05-12: 1 via ORAL
  Filled 2019-05-12: qty 1

## 2019-05-12 MED ORDER — METHOCARBAMOL 500 MG PO TABS
500.0000 mg | ORAL_TABLET | Freq: Three times a day (TID) | ORAL | Status: AC
  Administered 2019-05-12 – 2019-05-13 (×4): 500 mg via ORAL
  Filled 2019-05-12 (×4): qty 1

## 2019-05-12 MED ORDER — DILTIAZEM HCL 25 MG/5ML IV SOLN
10.0000 mg | Freq: Once | INTRAVENOUS | Status: AC
  Administered 2019-05-12: 14:00:00 10 mg via INTRAVENOUS
  Filled 2019-05-12: qty 5

## 2019-05-12 MED ORDER — ATORVASTATIN CALCIUM 20 MG PO TABS
20.0000 mg | ORAL_TABLET | Freq: Every day | ORAL | Status: DC
  Administered 2019-05-13 – 2019-05-14 (×2): 20 mg via ORAL
  Filled 2019-05-12 (×2): qty 1

## 2019-05-12 MED ORDER — DILTIAZEM HCL-DEXTROSE 125-5 MG/125ML-% IV SOLN (PREMIX)
5.0000 mg/h | INTRAVENOUS | Status: DC
  Administered 2019-05-12: 5 mg/h via INTRAVENOUS
  Administered 2019-05-13: 10 mg/h via INTRAVENOUS
  Filled 2019-05-12: qty 125

## 2019-05-12 MED ORDER — ACETAMINOPHEN 650 MG RE SUPP: 650.0000 mg | Freq: Four times a day (QID) | RECTAL | Status: DC | PRN

## 2019-05-12 MED ORDER — ACETAMINOPHEN 325 MG PO TABS: 650.0000 mg | ORAL_TABLET | Freq: Four times a day (QID) | ORAL | Status: DC | PRN

## 2019-05-12 MED ORDER — DILTIAZEM HCL 30 MG PO TABS
60.0000 mg | ORAL_TABLET | Freq: Three times a day (TID) | ORAL | Status: DC
  Administered 2019-05-12: 60 mg via ORAL
  Filled 2019-05-12: qty 2

## 2019-05-12 MED ORDER — LORAZEPAM 2 MG/ML IJ SOLN
0.5000 mg | Freq: Once | INTRAMUSCULAR | Status: AC
  Administered 2019-05-12: 0.5 mg via INTRAVENOUS
  Filled 2019-05-12: qty 1

## 2019-05-12 MED ORDER — ONDANSETRON HCL 4 MG PO TABS: 4.0000 mg | ORAL_TABLET | Freq: Four times a day (QID) | ORAL | Status: DC | PRN

## 2019-05-12 NOTE — ED Provider Notes (Signed)
Emergency Department Provider Note   I have reviewed the triage vital signs and the nursing notes.   HISTORY  Chief Complaint Hip Pain   HPI Bryan Hamilton is a 74 y.o. male with PMH of COPD, HLD, and HTN presents to the ED with right hip pain.  Patient states that 2 weeks ago he was trying to get up into a lifted truck when he felt a pulling pain in the back of his right hip/buttocks area.  He notes pain with walking.  He has been taking Tylenol arthritis formulation along with topical pain medicines with only temporary relief.  He called his primary care doctor today who referred him to the emergency department to be evaluated and potentially have an x-ray.  He denies any falls.  He is not having pain in his knee or ankle.  He denies any other symptoms. Patient denies any back pain. Occasionally noted a "numbness" in the area that does not radiate and lasts only minutes. No sensation like that currently. No shooting pain or numbness down the leg. No groin numbness. No bowel/bladder symptoms.   In triage the patient's heart rate is elevated and he was placed on monitor.  He is found to be in atrial fibrillation with RVR.  He has normal blood pressure.  He denies any symptoms of heart palpitations, chest pain, shortness of breath.  He denies any known history of atrial fibrillation.  He cannot recall the names of his home medications.   Past Medical History:  Diagnosis Date  . Arthritis   . COPD (chronic obstructive pulmonary disease) ([place])   . Hyperchloremia   . Hypertension     Patient Active Problem List   Diagnosis Date Noted  . New onset atrial fibrillation ([place]) 05/12/2019    Past Surgical History:  Procedure Laterality Date  . BACK SURGERY      Allergies Patient has no known allergies.  History reviewed. No pertinent family history.  Social History Social History   Tobacco Use  . Smoking status: Former Research scientist (life sciences)  . Smokeless tobacco: Never Used  Substance Use  Topics  . Alcohol use: Yes    Comment: occ  . Drug use: Never    Review of Systems  Constitutional: No fever/chills Eyes: No visual changes. ENT: No sore throat. Cardiovascular: Denies chest pain. Respiratory: Denies shortness of breath. Gastrointestinal: No abdominal pain.  No nausea, no vomiting.  No diarrhea.  No constipation. Genitourinary: Negative for dysuria. Musculoskeletal: Negative for back pain. Positive right hip pain.  Skin: Negative for rash. Neurological: Negative for headaches, focal weakness or numbness.  10-point ROS otherwise negative.  ____________________________________________   PHYSICAL EXAM:  VITAL SIGNS: ED Triage Vitals  Enc Vitals Group     BP 05/12/19 1337 (!) 133/98     Pulse Rate 05/12/19 1337 (!) 109     Resp 05/12/19 1337 20     Temp 05/12/19 1337 97.9 F (36.6 C)     Temp Source 05/12/19 1337 Tympanic     SpO2 05/12/19 1337 96 %     Weight 05/12/19 1339 212 lb (96.2 kg)     Height 05/12/19 1339 5\' 8"  (1.727 m)   Constitutional: Alert and oriented. Well appearing and in no acute distress. Eyes: Conjunctivae are normal. Head: Atraumatic. Nose: No congestion/rhinnorhea. Mouth/Throat: Mucous membranes are moist.  Neck: No stridor.   Cardiovascular: A-fib with RVR. Good peripheral circulation. Grossly normal heart sounds.   Respiratory: Normal respiratory effort.  No retractions. Lungs CTAB. Gastrointestinal:  Soft and nontender. No distention.  Musculoskeletal: Normal ROM of the right hip. No tenderness over the right knee or ankle.  Neurologic:  Normal speech and language. No gross focal neurologic deficits are appreciated.  Skin:  Skin is warm, dry and intact. No rash noted.  ____________________________________________   LABS (all labs ordered are listed, but only abnormal results are displayed)  Labs Reviewed  COMPREHENSIVE METABOLIC PANEL - Abnormal; Notable for the following components:      Result Value   Glucose, Bld 105  (*)    All other components within normal limits  CBC WITH DIFFERENTIAL/PLATELET - Abnormal; Notable for the following components:   WBC 10.7 (*)    Neutro Abs 8.1 (*)    Monocytes Absolute 1.2 (*)    All other components within normal limits  RESPIRATORY PANEL BY RT PCR (FLU A&B, COVID)  MRSA PCR SCREENING  PROTIME-INR  TSH  MAGNESIUM  TROPONIN I (HIGH SENSITIVITY)  TROPONIN I (HIGH SENSITIVITY)   ____________________________________________  EKG   EKG Interpretation  Date/Time:  Thursday May 12 2019 13:54:50 EDT Ventricular Rate:  139 PR Interval:    QRS Duration: 103 QT Interval:  309 QTC Calculation: 470 R Axis:   -104 Text Interpretation: Atrial fibrillation with RVR Left anterior fascicular block Low voltage, precordial leads Abnormal R-wave progression, late transition No STEMI Confirmed by Nanda Quinton [phone]) on 05/12/2019 2:17:55 PM       ____________________________________________  RADIOLOGY  DG Lumbar Spine 2-3 Views  Result Date: 05/12/2019 CLINICAL DATA:  74 year old male with right hip pain. EXAM: LUMBAR SPINE - 2-3 VIEW COMPARISON:  CT abdomen pelvis dated 10/20/2017. FINDINGS: Five lumbar type vertebra. There is no acute fracture or subluxation of the lumbar spine. Advanced multilevel degenerative changes with disc space narrowing, endplate irregularity, and osteophyte/spurring. Chronic appearing compression fracture of L2. There is resulting lumbar scoliosis. The visualized posterior elements are intact. Multilevel facet arthropathy. There is atherosclerotic calcification of the aorta. The aorta appears ectatic. IMPRESSION: 1. No acute fracture or subluxation. 2. 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 Electronically Signed   By: Anner Crete M.D.   On: 05/12/2019 18:55   DG Chest Portable 1 View  Result Date: 05/12/2019 CLINICAL DATA:  Atrial fibrillation. EXAM: PORTABLE CHEST 1 VIEW COMPARISON:  Jun 02, 2011. FINDINGS: The  heart size and mediastinal contours are within normal limits. No pneumothorax or pleural effusion is noted. Mild left basilar atelectasis or infiltrate is noted. The visualized skeletal structures are unremarkable. IMPRESSION: Mild left basilar atelectasis or infiltrate is noted. Electronically Signed   By: Marijo Conception M.D.   On: 05/12/2019 14:37   DG Hip Unilat W or Wo Pelvis 2-3 Views Right  Result Date: 05/12/2019 CLINICAL DATA:  Right hip pain, began after getting into a lifted truck 2 weeks prior. EXAM: DG HIP (WITH OR WITHOUT PELVIS) 2-3V RIGHT COMPARISON:  None. FINDINGS: No acute bony abnormality. Specifically, no fracture, subluxation, or dislocation. No abnormal diastatic widening of the SI joints or symphysis pubis. Femoral heads are normally located. Moderate degenerative changes noted in the lower lumbar spine with milder changes at the SI joints and symphysis pubis. Monitoring leads overlie the included portions of the abdomen and pelvis. Normal bowel gas pattern. Vascular calcium noted in the medial thighs. Soft tissues otherwise unremarkable. IMPRESSION: No acute bony abnormality. Degenerative changes in the lower lumbar spine, hips and SI joints. Electronically Signed   By: Lovena Le M.D.   On: 05/12/2019 15:20  ____________________________________________   PROCEDURES  Procedure(s) performed:   .Critical Care Performed by: Margette Fast, MD Authorized by: Margette Fast, MD   Critical care provider statement:    Critical care time (minutes):  45   Critical care time was exclusive of:  Separately billable procedures and treating other patients and teaching time   Critical care was necessary to treat or prevent imminent or life-threatening deterioration of the following conditions:  Circulatory failure (A-fib with RVR)   Critical care was time spent personally by me on the following activities:  Discussions with consultants, evaluation of patient's response to  treatment, examination of patient, ordering and performing treatments and interventions, ordering and review of laboratory studies, ordering and review of radiographic studies, pulse oximetry, re-evaluation of patient's condition, obtaining history from patient or surrogate, review of old charts, blood draw for specimens and development of treatment plan with patient or surrogate   I assumed direction of critical care for this patient from another provider in my specialty: no      ____________________________________________   INITIAL IMPRESSION / ASSESSMENT AND PLAN / ED COURSE  Pertinent labs & imaging results that were available during my care of the patient were reviewed by me and considered in my medical decision making (see chart for details).   Patient presents emergency department for evaluation of right hip pain.  Pain is been present for the past 2 weeks.  Normal range of motion of the joint.  No concern for septic joint clinically.  Plan for plain films of the right hip.  He has no red flag signs or symptoms to suspect acute spine emergency.  No neuro deficits on exam.  Of note, during triage vitals the patient was found to be tachycardic.  EKG shows atrial fibrillation with RVR.  He is asymptomatic in terms of his A. fib symptoms as far as I can tell.  He is not a candidate for ED cardioversion in the setting.  He questions whether or not he is on a blood thinner.  It is possible that this has been diagnosed previously.  I will ask the pharmacy tech to call the patient's pharmacy to get a med list which may help direct further intervention for his A. fib.  Plan for IV diltiazem push and p.o. medication to try and avoid diltiazem infusion. Hold on anticoagulation until med list received.   Labs pending. Plain films reviewed. Care transferred to Dr. Rogene Houston pending reassess after attempt at rate control.  ____________________________________________  FINAL CLINICAL IMPRESSION(S) / ED  DIAGNOSES  Final diagnoses:  Atrial fibrillation with RVR (HCC)  Right hip pain     MEDICATIONS GIVEN DURING THIS VISIT:  Medications  HYDROcodone-acetaminophen (NORCO/VICODIN) 5-325 MG per tablet 1 tablet (has no administration in time range)  atorvastatin (LIPITOR) tablet 20 mg (has no administration in time range)  albuterol (PROVENTIL) (2.5 MG/3ML) 0.083% nebulizer solution 3 mL (has no administration in time range)  Fluticasone-Umeclidin-Vilant 100-62.5-25 MCG/INH AEPB 1 puff (has no administration in time range)  methocarbamol (ROBAXIN) tablet 500 mg (500 mg Oral Given 05/12/19 2204)  acetaminophen (TYLENOL) tablet 650 mg (has no administration in time range)    Or  acetaminophen (TYLENOL) suppository 650 mg (has no administration in time range)  polyethylene glycol (MIRALAX / GLYCOLAX) packet 17 g (has no administration in time range)  ondansetron (ZOFRAN) tablet 4 mg (has no administration in time range)    Or  ondansetron (ZOFRAN) injection 4 mg (has no administration in time range)  apixaban (ELIQUIS) tablet 5 mg (5 mg Oral Given 05/12/19 1815)  diltiazem (CARDIZEM) 125 mg in dextrose 5% 125 mL (1 mg/mL) infusion (10 mg/hr Intravenous New Bag/Given 05/13/19 0615)  Chlorhexidine Gluconate Cloth 2 % PADS 6 each (has no administration in time range)  diltiazem (CARDIZEM) injection 10 mg (10 mg Intravenous Given 05/12/19 1424)  diltiazem (CARDIZEM) tablet 30 mg (30 mg Oral Given 05/12/19 1425)  LORazepam (ATIVAN) injection 0.5 mg (0.5 mg Intravenous Given 05/12/19 1439)  HYDROcodone-acetaminophen (NORCO/VICODIN) 5-325 MG per tablet 1 tablet (1 tablet Oral Given 05/12/19 1812)     Note:  This document was prepared using Dragon voice recognition software and may include unintentional dictation errors.  Nanda Quinton, MD, [facility] Emergency Medicine    Harlyn Rathmann, [facility], MD 05/13/19 [id number]

## 2019-05-12 NOTE — ED Notes (Signed)
Leanord Hawking [phone] Daughter To be contacted wife unable

## 2019-05-12 NOTE — H&P (Addendum)
History and Physical    Bryan Hamilton [id number] DOB: Apr 13, 1945 DOA: 05/12/2019  PCP: Celene Squibb, MD   Patient coming from: Home  I have personally briefly reviewed patient's old medical records in [place]  Chief Complaint: Back pain  HPI: Bryan Hamilton is a 74 y.o. male with medical history significant for COPD, hypertension.  Patient presented to the ED with complaints of right hip and back pain that started 2 weeks ago.  He was trying to climb into a lifted truck when he felt a pull in his back that radiated to his right hip and down his right leg.  No abnormal sensation in his lower extremities, no lower extremity weakness.  At baseline he ambulates with a cane, but with onset of pain he has been ambulating with a walker too. Pain has been persistent, he has tried Tylenol without significant relief of pain.  He has chronic back pain and has had back surgeries in the past. He denies palpitations, he is unaware of his heart rate or rhythm, he denies chest pain, he has chronic unchanged difficulty breathing.  No cough.  No fever or chills.  He denies falls.  No history of GI bleeds.  No history of heart problems.  He very occasionally drinks alcohol maybe once a month.  ED Course: Found to be in atrial fibrillation rates up to 130s.  Blood pressure 130s to 160s, chest x-ray showing mild left base atelectasis/infiltrate.  X-ray shows degenerative changes in lower lumbar spine hips and SI joint.  Potassium 4.1.  Troponin 7 > 8.  Given IV Cardizem 10 mg and 10 p.o. Cardizem 30 mg with initial plans to send patient home, but with heart rate persisting in the 110s to 120s, hospitalist was called to admit.  Review of Systems: As per HPI all other systems reviewed and negative.  Past Medical History:  Diagnosis Date  . Arthritis   . COPD (chronic obstructive pulmonary disease) ([place])   . Hyperchloremia   . Hypertension     Past Surgical History:  Procedure Laterality  Date  . BACK SURGERY       reports that he has quit smoking. He has never used smokeless tobacco. He reports current alcohol use. He reports that he does not use drugs.  No Known Allergies  Family history of hypertension.  Prior to Admission medications   Medication Sig Start Date End Date Taking? Authorizing Provider  acetaminophen (TYLENOL) 650 MG CR tablet Take 1,300 mg by mouth 2 (two) times daily as needed for pain.   Yes [provider]  atorvastatin (LIPITOR) 20 MG tablet Take 1 tablet by mouth daily. 03/07/19  Yes [provider]  lisinopril (ZESTRIL) 10 MG tablet Take 20 mg by mouth daily.  03/18/19  Yes [provider]  Omega-3 Fatty Acids (OMEGA 3 PO) Take 6 capsules by mouth daily.   Yes [provider]  Polyethyl Glycol-Propyl Glycol (SYSTANE) 0.4-0.3 % SOLN Place 1-2 drops into both eyes daily as needed.   Yes [provider]  PROAIR HFA 108 (90 Base) MCG/ACT inhaler Inhale 2 puffs into the lungs every 6 (six) hours as needed. 03/31/19  Yes [provider]  TRELEGY ELLIPTA 100-62.5-25 MCG/INH AEPB Inhale 1 puff into the lungs daily. 03/31/19  Yes [provider]    Physical Exam: Vitals:   05/12/19 1521 05/12/19 1600 05/12/19 1630 05/12/19 1700  BP: (!) 142/82 (!) 131/94 (!) 150/107 (!) 143/109  Pulse: (!) 116 Marland Kitchen)  117 93 (!) 109  Resp: (!) 22 17 20 12   Temp:      TempSrc:      SpO2: 96% 96% 96% 95%  Weight:      Height:        Constitutional: NAD, calm, comfortable Vitals:   05/12/19 1521 05/12/19 1600 05/12/19 1630 05/12/19 1700  BP: (!) 142/82 (!) 131/94 (!) 150/107 (!) 143/109  Pulse: (!) 116 (!) 117 93 (!) 109  Resp: (!) 22 17 20 12   Temp:      TempSrc:      SpO2: 96% 96% 96% 95%  Weight:      Height:       Eyes: PERRL, lids and conjunctivae normal ENMT: Mucous membranes are moist.  Neck: normal, supple, no masses, no thyromegaly Respiratory:  Normal respiratory effort. No accessory muscle  use.   Cardiovascular: Tachycardic, irregular rate and rhythm, No extremity edema. 2+ pedal pulses. No carotid bruits.  Abdomen: no tenderness, no masses palpated. No hepatosplenomegaly. Bowel sounds positive.  Musculoskeletal: no clubbing / cyanosis. No joint deformity upper and lower extremities. Good ROM, no contractures. Normal muscle tone.  Skin: no rashes, lesions, ulcers. No induration Neurologic: No apparent cranial nerve abnormality, no facial asymmetry, speech is clear and fluent, 5/5 strength in bilateral upper and lower extremities. Psychiatric: Normal judgment and insight. Alert and oriented x 3. Normal mood.   Labs on Admission: I have personally reviewed following labs and imaging studies  CBC: Recent Labs  Lab 05/12/19 1423  WBC 10.7*  NEUTROABS 8.1*  HGB 14.6  HCT 45.5  MCV 97.0  PLT 0000000   Basic Metabolic Panel: Recent Labs  Lab 05/12/19 1423  NA 137  K 4.1  CL 102  CO2 23  GLUCOSE 105*  BUN 23  CREATININE 1.10  CALCIUM 9.1  MG 2.0   Liver Function Tests: Recent Labs  Lab 05/12/19 1423  AST 24  ALT 23  ALKPHOS 49  BILITOT 0.8  PROT 7.0  ALBUMIN 3.9   Coagulation Profile: Recent Labs  Lab 05/12/19 1423  INR 1.2   Radiological Exams on Admission: DG Chest Portable 1 View  Result Date: 05/12/2019 CLINICAL DATA:  Atrial fibrillation. EXAM: PORTABLE CHEST 1 VIEW COMPARISON:  Jun 02, 2011. FINDINGS: The heart size and mediastinal contours are within normal limits. No pneumothorax or pleural effusion is noted. Mild left basilar atelectasis or infiltrate is noted. The visualized skeletal structures are unremarkable. IMPRESSION: Mild left basilar atelectasis or infiltrate is noted. Electronically Signed   By: Marijo Conception M.D.   On: 05/12/2019 14:37   DG Hip Unilat W or Wo Pelvis 2-3 Views Right  Result Date: 05/12/2019 CLINICAL DATA:  Right hip pain, began after getting into a lifted truck 2 weeks prior. EXAM: DG HIP (WITH OR WITHOUT PELVIS) 2-3V  RIGHT COMPARISON:  None. FINDINGS: No acute bony abnormality. Specifically, no fracture, subluxation, or dislocation. No abnormal diastatic widening of the SI joints or symphysis pubis. Femoral heads are normally located. Moderate degenerative changes noted in the lower lumbar spine with milder changes at the SI joints and symphysis pubis. Monitoring leads overlie the included portions of the abdomen and pelvis. Normal bowel gas pattern. Vascular calcium noted in the medial thighs. Soft tissues otherwise unremarkable. IMPRESSION: No acute bony abnormality. Degenerative changes in the lower lumbar spine, hips and SI joints. Electronically Signed   By: Lovena Le M.D.   On: 05/12/2019 15:20    EKG: Independently reviewed.  Atrial fibrillation rate  118, QTc 454.  LAFB.  No prior EKG to compare.  Assessment/Plan Active Problems:   New onset atrial fibrillation ([place])  New onset atrial fibrillation- asymptomatic, hence unknown duration.  Rate is on my evaluation 90s to 120s.  Normal potassium 4.1.  No prior echo on file.  No history of heart disease.  Troponin unremarkable.  Denies history of frequent falls or GI bleed.  Risk and benefits of anticoagulation explained to patient and his grandson at bedside.  Agreed to start anticoagulation. -Obtain echocardiogram -Anticoagulation with Eliquis - continue oral Cardizem at 60 mg every 8 hourly -IV metoprolol 5 mg daily 5 minutes for heart rate greater than 110 -Check TSH, magnesium. -Cardiology consult - addendum- Heart rates now up to 140s, will start cardizem drip. D/c Po cardizem. Admit to step down.   Acute on chronic back pain-pelvis x-ray negative for acute abnormality, shows degenerative changes in the lower lumbar spine.  Pain radiating down right lower extremity, no focal neurologic deficits. -Obtain lumbar x-ray -Methocarbamol 500 TID x 5 doses. -Norco 5/325 every 6 hourly PRN - PT eval  COPD-stable unchanged dyspnea, no cough.  Chest x-ray  mild left base atelectasis versus infiltrate.  Afebrile, WBC 10.7. -Resume home bronchodilators/inhalers  Hypertension-blood pressure stable. -Hold lisinopril 20mg  daily to allow for adjustment of rate limiting medications.   DVT prophylaxis: Eliquis Code Status: Full code Family Communication: Grandson at bedside, Donna Christen. Disposition Plan: Pending improvement in heart rate. Consults called: cardiology Admission status: Inpt, Stepdown.  At the time of admission, it appears that the appropriate admission status for this patient is INPATIENT. This is judged to be reasonable and necessary in order to provide the required intensity of service to ensure the patient's safety given the presenting symptoms, physical exam findings, and initial radiographic and laboratory data in the context of their  Comorbid conditions.   Patient requires inpatient status due to high intensity of service, high risk for further deterioration and high frequency of surveillance required.   I certify that at the point of admission it is my clinical judgment that the patient will require inpatient hospital care spanning beyond 2 midnights.     Bethena Roys MD [facility]  05/12/2019, 5:54 PM

## 2019-05-12 NOTE — ED Triage Notes (Signed)
Pt c/o right hip pain. States pain began 2 weeks ago when pt was attempting to get into a lifted truck. Pt states he felt a pulling sensation in back and then pain began in hip.   PCP recommended x-ray.

## 2019-05-12 NOTE — ED Provider Notes (Signed)
Patient rapid ventricular rate atrial fibrillation did not hold with the oral diltiazem.  So patient be started on diltiazem drip.  Patient still stable.  Patient in no acute distress.  Discussed with the hospitalist they will admit.  Cardiology is available for consult tomorrow.  Patient's Covid testing ordered.  CRITICAL CARE Performed by: Fredia Sorrow Total critical care time: 35 minutes Critical care time was exclusive of separately billable procedures and treating other patients. Critical care was necessary to treat or prevent imminent or life-threatening deterioration. Critical care was time spent personally by me on the following activities: development of treatment plan with patient and/or surrogate as well as nursing, discussions with consultants, evaluation of patient's response to treatment, examination of patient, obtaining history from patient or surrogate, ordering and performing treatments and interventions, ordering and review of laboratory studies, ordering and review of radiographic studies, pulse oximetry and re-evaluation of patient's condition.   Fredia Sorrow, MD 05/12/19 [id number]

## 2019-05-12 NOTE — ED Notes (Signed)
Have notified Dr. Rogene Houston of increased HR upon standing to use urinal.

## 2019-05-12 NOTE — Progress Notes (Signed)
ANTICOAGULATION CONSULT NOTE - Initial Consult  Pharmacy Consult for Apixaban Indication: atrial fibrillation  No Known Allergies  Patient Measurements: Height: 5\' 8"  (172.7 cm) Weight: 96.2 kg (212 lb) IBW/kg (Calculated) : 68.4  Vital Signs: Temp: 97.9 F (36.6 C) (04/22 1337) Temp Source: Tympanic (04/22 1337) BP: 155/101 (04/22 1730) Pulse Rate: 101 (04/22 1730)   Assessment: Patient is new onset AFib and not on any anticoagulation prior to admit.  He has been receiving Diltiazem for rate control.  Pharmacy has been asked to initiate Apixaban for him. Lab Review:  Stable H/H (no noted bleeding), INR 1.2, Screat. 1.2 and an estimated crcl of 59ml/min. Criteria for dosing reviewed: 1)  Age >/= 80 years 2)  Weight </= 60 kg 3)  Serum Creatinine >/= 1.5 Drug/Drug Interaction reviewed: Apixaban and Diltiazem Pharmacologic effects and plasma concentrations of apixaban may be increased by moderate CYP3A4 inhibitors (e.g. Moderate CYP3A4 Inhibitors).   Plan:   - Will begin Apixaban 5mg  twice daily  - Monitor for s/s of bleeding   - Provide education prior to discharge for this new medication.  Rober Minion, PharmD., MS Clinical Pharmacist Thank you for allowing pharmacy to be part of this patients care team.   Labs: Recent Labs    05/12/19 1423 05/12/19 1537  HGB 14.6  --   HCT 45.5  --   PLT 195  --   LABPROT 15.2  --   INR 1.2  --   CREATININE 1.10  --   TROPONINIHS 7 8   Estimated Creatinine Clearance: 64.2 mL/min (by C-G formula based on SCr of 1.1 mg/dL).  Medical History: Past Medical History:  Diagnosis Date  . Arthritis   . COPD (chronic obstructive pulmonary disease) ([place])   . Hyperchloremia   . Hypertension     05/12/2019,6:02 PM

## 2019-05-13 ENCOUNTER — Encounter (HOSPITAL_COMMUNITY): Payer: Self-pay | Admitting: Internal Medicine

## 2019-05-13 ENCOUNTER — Inpatient Hospital Stay (HOSPITAL_COMMUNITY): Payer: Medicare Other

## 2019-05-13 DIAGNOSIS — I4891 Unspecified atrial fibrillation: Principal | ICD-10-CM

## 2019-05-13 DIAGNOSIS — M25551 Pain in right hip: Secondary | ICD-10-CM

## 2019-05-13 DIAGNOSIS — Z7189 Other specified counseling: Secondary | ICD-10-CM

## 2019-05-13 DIAGNOSIS — E785 Hyperlipidemia, unspecified: Secondary | ICD-10-CM

## 2019-05-13 DIAGNOSIS — I340 Nonrheumatic mitral (valve) insufficiency: Secondary | ICD-10-CM

## 2019-05-13 DIAGNOSIS — I10 Essential (primary) hypertension: Secondary | ICD-10-CM

## 2019-05-13 LAB — MRSA PCR SCREENING: MRSA by PCR: NEGATIVE

## 2019-05-13 LAB — ECHOCARDIOGRAM COMPLETE
Height: 68 in
Weight: 3294.55 oz

## 2019-05-13 MED ORDER — DILTIAZEM HCL 60 MG PO TABS
60.0000 mg | ORAL_TABLET | Freq: Four times a day (QID) | ORAL | Status: DC
  Administered 2019-05-13 – 2019-05-14 (×5): 60 mg via ORAL
  Filled 2019-05-13 (×5): qty 1

## 2019-05-13 MED ORDER — UMECLIDINIUM BROMIDE 62.5 MCG/INH IN AEPB
1.0000 | INHALATION_SPRAY | Freq: Every day | RESPIRATORY_TRACT | Status: DC
  Administered 2019-05-13 – 2019-05-14 (×2): 1 via RESPIRATORY_TRACT
  Filled 2019-05-13: qty 7

## 2019-05-13 MED ORDER — FLUTICASONE FUROATE-VILANTEROL 100-25 MCG/INH IN AEPB
1.0000 | INHALATION_SPRAY | Freq: Every day | RESPIRATORY_TRACT | Status: DC
  Administered 2019-05-13 – 2019-05-14 (×2): 1 via RESPIRATORY_TRACT
  Filled 2019-05-13: qty 28

## 2019-05-13 MED ORDER — CHLORHEXIDINE GLUCONATE CLOTH 2 % EX PADS
6.0000 | MEDICATED_PAD | Freq: Every day | CUTANEOUS | Status: DC
  Administered 2019-05-13 – 2019-05-14 (×2): 6 via TOPICAL

## 2019-05-13 NOTE — Plan of Care (Signed)
  Problem: Acute Rehab PT Goals(only PT should resolve) Goal: Pt Will Go Supine/Side To Sit Outcome: Progressing Flowsheets (Taken 05/13/2019 0942) Pt will go Supine/Side to Sit:  with min guard assist  with supervision Goal: Patient Will Transfer Sit To/From Stand Outcome: Progressing Flowsheets (Taken 05/13/2019 [phone]) Patient will transfer sit to/from stand: with modified independence Goal: Pt Will Transfer Bed To Chair/Chair To Bed Outcome: Progressing Flowsheets (Taken 05/13/2019 0942) Pt will Transfer Bed to Chair/Chair to Bed: with modified independence Goal: Pt Will Ambulate Outcome: Progressing Flowsheets (Taken 05/13/2019 0942) Pt will Ambulate:  100 feet  with modified independence  with rolling walker   9:43 AM, 05/13/19 Lonell Grandchild, MPT Physical Therapist with [facility] [phone] office [phone] mobile phone

## 2019-05-13 NOTE — Care Management (Signed)
Patient recommended for outpatient PT. Discussed with patient he is not interested in a referral at this time.

## 2019-05-13 NOTE — Consult Note (Addendum)
Cardiology Consult    Patient ID: Bryan Hamilton; [id number]; 1945/03/27   Admit date: 05/12/2019 Date of Consult: 05/13/2019  Primary Care Provider: Celene Squibb, MD Primary Cardiologist: New to [facility] - Dr. Bronson Ing  Patient Profile    Bryan Hamilton is a 74 y.o. male with past medical history of HTN, HLD and COPD who is being seen today for the evaluation of new-onset atrial fibrillation at the request of Dr. Denton Brick.   History of Present Illness    Bryan Hamilton presented to on 05/12/2019 for evaluation of pain along his right hip which started 2 weeks prior when climbing into a lifted truck. He was taking Tylenol and using topical pain medication without improvement. HR was elevated on arrival to the ED and upon being connected to the monitor this showed atrial fibrillation with RVR. In talking with the patient today, he denies any recent chest pain or palpitations. Has baseline dyspnea on exertion in the setting of COPD but denies any recent orthopnea, PND or lower extremity edema. No known personal history of CAD, CHF or arrhythmias. No known family history of cardiac issues. 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   Initial labs showed WBC 10.7, Hgb 14.6, platelets 195, Na+ 137, K+ 4.1 and creatinine 1.10. TSH 1.764. Mg 2.0. Initial HS Troponin 7 with repeat of 8. COVID negative. CXR showed mild left basilar atelectasis or infiltrate. Imaging of the hip showed no acute bony abnormalities but was noted to have degenerative changes. EKG showed atrial fibrillation with RVR, HR 139 with no prior tracings available for comparison.   He was initially started on IV Lopressor and PO Cardizem but HR remained elevated in the 140's, therefore he was started on Cardizem drip on admission. Risks and benefits of anticoagulation were reviewed and he was started on Eliquis 5 mg twice daily.  He is currently on IV Cardizem at 10 mg/hr and oral tablets have  already been ordered to begin at 60mg  Q6H. PTA Lisinopril is currently held to allow for titration of AV nodal blocking agents.    Past Medical History:  Diagnosis Date  . Arthritis   . COPD (chronic obstructive pulmonary disease) ([place])   . Hyperchloremia   . Hypertension     Past Surgical History:  Procedure Laterality Date  . BACK SURGERY       Home Medications:  Prior to Admission medications   Medication Sig Start Date End Date Taking? Authorizing Provider  acetaminophen (TYLENOL) 650 MG CR tablet Take 1,300 mg by mouth 2 (two) times daily as needed for pain.   Yes [provider]  atorvastatin (LIPITOR) 20 MG tablet Take 1 tablet by mouth daily. 03/07/19  Yes [provider]  lisinopril (ZESTRIL) 10 MG tablet Take 20 mg by mouth daily.  03/18/19  Yes [provider]  Omega-3 Fatty Acids (OMEGA 3 PO) Take 6 capsules by mouth daily.   Yes [provider]  Polyethyl Glycol-Propyl Glycol (SYSTANE) 0.4-0.3 % SOLN Place 1-2 drops into both eyes daily as needed.   Yes [provider]  PROAIR HFA 108 (90 Base) MCG/ACT inhaler Inhale 2 puffs into the lungs every 6 (six) hours as needed. 03/31/19  Yes [provider]  TRELEGY ELLIPTA 100-62.5-25 MCG/INH AEPB Inhale 1 puff into the lungs daily. 03/31/19  Yes [provider]    Inpatient Medications: Scheduled Meds: . apixaban  5 mg Oral BID  . atorvastatin  20 mg Oral Daily  . Chlorhexidine Gluconate Cloth  6 each Topical Daily  . diltiazem  60 mg Oral Q6H  . fluticasone furoate-vilanterol  1 puff Inhalation Daily  . methocarbamol  500 mg Oral TID  . umeclidinium bromide  1 puff Inhalation Daily   Continuous Infusions: . diltiazem (CARDIZEM) infusion 10 mg/hr (05/13/19 0615)   PRN Meds: acetaminophen **OR** acetaminophen, albuterol, HYDROcodone-acetaminophen, ondansetron **OR** ondansetron (ZOFRAN) IV, polyethylene glycol  Allergies:   No Known Allergies  Social  History:   Social History   Socioeconomic History  . Marital status: Married    Spouse name: Not on file  . Number of children: Not on file  . Years of education: Not on file  . Highest education level: Not on file  Occupational History  . Not on file  Tobacco Use  . Smoking status: Former Research scientist (life sciences)  . Smokeless tobacco: Never Used  Substance and Sexual Activity  . Alcohol use: Yes    Comment: occ  . Drug use: Never  . Sexual activity: Not on file  Other Topics Concern  . Not on file  Social History Narrative  . Not on file   Social Determinants of Health   Financial Resource Strain:   . Difficulty of Paying Living Expenses:   Food Insecurity:   . Worried About Charity fundraiser in the Last Year:   . Arboriculturist in the Last Year:   Transportation Needs:   . Film/video editor (Medical):   Marland Kitchen Lack of Transportation (Non-Medical):   Physical Activity:   . Days of Exercise per Week:   . Minutes of Exercise per Session:   Stress:   . Feeling of Stress :   Social Connections:   . Frequency of Communication with Friends and Family:   . Frequency of Social Gatherings with Friends and Family:   . Attends Religious Services:   . Active Member of Clubs or Organizations:   . Attends Archivist Meetings:   Marland Kitchen Marital Status:   Intimate Partner Violence:   . Fear of Current or Ex-Partner:   . Emotionally Abused:   Marland Kitchen Physically Abused:   . Sexually Abused:      Family History:    Family History  Problem Relation Age of Onset  . Cancer Father       Review of Systems    General:  No chills, fever, night sweats or weight changes. Positive for right hip pain.  Cardiovascular:  No chest pain, dyspnea on exertion, edema, orthopnea, palpitations, paroxysmal nocturnal dyspnea. Dermatological: No rash, lesions/masses Respiratory: No cough, dyspnea Urologic: No hematuria, dysuria Abdominal:   No nausea, vomiting, diarrhea, bright red blood per rectum,  melena, or hematemesis Neurologic:  No visual changes, wkns, changes in mental status. All other systems reviewed and are otherwise negative except as noted above.  Physical Exam/Data    Vitals:   05/13/19 0500 05/13/19 0700 05/13/19 0715 05/13/19 0730  BP:  136/79 (!) 104/58 (!) 127/94  Pulse:  (!) 103 (!) 108 86  Resp:  15 17 (!) 27  Temp:      TempSrc:      SpO2:  95% 96% 96%  Weight: 93.4 kg     Height:        Intake/Output Summary (Last 24 hours) at 05/13/2019 0855 Last data filed at 05/13/2019 0500 Gross per 24 hour  Intake 17.12 ml  Output 500 ml  Net -482.88 ml   [employer]  05/12/19 1339 05/12/19 2128 05/13/19 0500  Weight: 96.2 kg 93.4 kg 93.4 kg   Body mass index is 31.31 kg/m.   General: Pleasant, male appearing in NAD Psych: Normal affect. Neuro: Alert and oriented X 3. Moves all extremities spontaneously. HEENT: Normal  Neck: Supple without bruits or JVD. Lungs:  Resp regular and unlabored, CTA without wheezing or rales. Heart: Irregularly irregular. no s3, s4, or murmurs. Abdomen: Soft, non-tender, non-distended, BS + x 4.  Extremities: No clubbing or cyanosis. Trace lower extremity edema. DP/PT/Radials 2+ and equal bilaterally.   EKG:  The EKG was personally reviewed and demonstrates: Atrial fibrillation with RVR, HR 139 with no prior tracings available for comparison.   Telemetry:  Telemetry was personally reviewed and demonstrates: Atrial fibrillation, HR mostly in 80's to 90's, briefly peaking into the 120's. Occasional PVC's.    Labs/Studies     Relevant CV Studies:  Echocardiogram: Pending  Laboratory Data:  Chemistry Recent Labs  Lab 05/12/19 1423  NA 137  K 4.1  CL 102  CO2 23  GLUCOSE 105*  BUN 23  CREATININE 1.10  CALCIUM 9.1  GFRNONAA >60  GFRAA >60  ANIONGAP 12    Recent Labs  Lab 05/12/19 1423  PROT 7.0  ALBUMIN 3.9  AST 24  ALT 23  ALKPHOS 49  BILITOT 0.8   Hematology Recent Labs  Lab 05/12/19 1423    WBC 10.7*  RBC 4.69  HGB 14.6  HCT 45.5  MCV 97.0  MCH 31.1  MCHC 32.1  RDW 15.0  PLT 195   Cardiac EnzymesNo results for input(s): TROPONINI in the last 168 hours. No results for input(s): TROPIPOC in the last 168 hours.  BNPNo results for input(s): BNP, PROBNP in the last 168 hours.  DDimer No results for input(s): DDIMER in the last 168 hours.  Radiology/Studies:  DG Lumbar Spine 2-3 Views  Result Date: 05/12/2019 CLINICAL DATA:  74 year old male with right hip pain. EXAM: LUMBAR SPINE - 2-3 VIEW COMPARISON:  CT abdomen pelvis dated 10/20/2017. FINDINGS: Five lumbar type vertebra. There is no acute fracture or subluxation of the lumbar spine. Advanced multilevel degenerative changes with disc space narrowing, endplate irregularity, and osteophyte/spurring. Chronic appearing compression fracture of L2. There is resulting lumbar scoliosis. The visualized posterior elements are intact. Multilevel facet arthropathy. There is atherosclerotic calcification of the aorta. The aorta appears ectatic. IMPRESSION: 1. No acute fracture or subluxation. 2. 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 Electronically Signed   By: Anner Crete M.D.   On: 05/12/2019 18:55   DG Chest Portable 1 View  Result Date: 05/12/2019 CLINICAL DATA:  Atrial fibrillation. EXAM: PORTABLE CHEST 1 VIEW COMPARISON:  Jun 02, 2011. FINDINGS: The heart size and mediastinal contours are within normal limits. No pneumothorax or pleural effusion is noted. Mild left basilar atelectasis or infiltrate is noted. The visualized skeletal structures are unremarkable. IMPRESSION: Mild left basilar atelectasis or infiltrate is noted. Electronically Signed   By: Marijo Conception M.D.   On: 05/12/2019 14:37   DG Hip Unilat W or Wo Pelvis 2-3 Views Right  Result Date: 05/12/2019 CLINICAL DATA:  Right hip pain, began after getting into a lifted truck 2 weeks prior. EXAM: DG HIP (WITH OR WITHOUT PELVIS) 2-3V RIGHT  COMPARISON:  None. FINDINGS: No acute bony abnormality. Specifically, no fracture, subluxation, or dislocation. No abnormal diastatic widening of the SI joints or symphysis pubis. Femoral heads are normally located. Moderate degenerative changes noted in the lower lumbar spine with  milder changes at the SI joints and symphysis pubis. Monitoring leads overlie the included portions of the abdomen and pelvis. Normal bowel gas pattern. Vascular calcium noted in the medial thighs. Soft tissues otherwise unremarkable. IMPRESSION: No acute bony abnormality. Degenerative changes in the lower lumbar spine, hips and SI joints. Electronically Signed   By: Lovena Le M.D.   On: 05/12/2019 15:20     Assessment & Plan    1. New-Onset Atrial Fibrillation with RVR - presented for evaluation of hip pain and was found to be in atrial fibrillation with RVR upon arrival. Asymptomatic with the arrhythmia and of unknown duration but was evaluated by his PCP two weeks ago and was not informed of an elevated HR at that time.  - Electrolytes and TSH WNL. HS Troponin values negative. Echocardiogram is pending to assess LV function and wall motion.  - currently on IV Cardizem at 10 mg/hr and oral tablets have already been ordered to begin at 60mg  Q6H. If EF reduced by echo, will need to use Toprol-XL in place of Cardizem CD. He did ambulate with PT and HR remained well-controlled.  - This patients CHA2DS2-VASc Score and unadjusted Ischemic Stroke Rate (% per year) is equal to 4.8 % stroke rate/year from a score of 4 (HTN, Aortic Plaque, Age (2)). He has been started on Eliquis 5mg  BID. Will consult Case Management for 30-day card and benefits check.   2. HTN - BP has been variable at 96/58 - 161/115 since admission, at 127/94 this AM. PTA Lisinopril 20mg  daily currently held to allow for titration of AV nodal blocking agents.   3. HLD - followed by PCP as an outpatient. He has been continued on Atorvastatin 20mg  daily.    4. Right Hip Pain - plain film on admission showed no acute bony abnormalities but was noted to have degenerative changes. Will need to follow-up with Orthopedics as an outpatient. PT consult has been ordered by the admitting team.    For questions or updates, please contact [facility] Please consult [email or web address] for contact info under Cardiology/STEMI.  Signed, Erma Heritage, PA-C 05/13/2019, 8:55 AM Pager: [phone]  The patient was seen and examined, and I agree with the history, physical exam, assessment and plan as documented above, with modifications made above and as noted below. I have also personally reviewed all relevant documentation, old records, labs, and both radiographic and cardiovascular studies. I have also independently interpreted old and new ECG's.  Briefly, this is a 74 year old male with a history of hypertension, hyperlipidemia, and COPD with no prior cardiac abnormalities who presented to the ED with right hip pain which has been going on for the past several weeks.  It began when he was climbing into an elevated truck.  When he came to the ED he was found to be in rapid atrial fibrillation.  He denies chest pain and palpitations altogether.  His daughter, Verdis Frederickson, was present in the room at the time my evaluation.  She plans to move here from [facility] to help take care of her father.  Patient has another daughter who lives in [place] with her family.  He was initially started on IV metoprolol and oral diltiazem but heart rate remained elevated in the 140 bpm range and he was started on IV diltiazem.  He was also started on Eliquis 5 mg twice daily.  He will soon transition to oral diltiazem 60 mg every 6 hours.  Blood pressures have been soft and lisinopril has  been held in order to advance AV nodal blocking agents.  An echocardiogram has been ordered and is pending to assess cardiac structure and function.  I answered several questions  which his daughter had and they were both very appreciative.   Kate Sable, MD, [facility]  05/13/2019 10:05 AM

## 2019-05-13 NOTE — Evaluation (Signed)
Physical Therapy Evaluation Patient Details Name: Bryan Hamilton MRN: [id number] DOB: 1945/04/07 Today's Date: 05/13/2019   History of Present Illness  Bryan Hamilton is a 74 y.o. male with medical history significant for COPD, hypertension.  Patient presented to the ED with complaints of right hip and back pain that started 2 weeks ago.  He was trying to climb into a lifted truck when he felt a pull in his back that radiated to his right hip and down his right leg.  No abnormal sensation in his lower extremities, no lower extremity weakness.  At baseline he ambulates with a cane, but with onset of pain he has been ambulating with a walker too.Pain has been persistent, he has tried Tylenol without significant relief of pain.  He has chronic back pain and has had back surgeries in the past.He denies palpitations, he is unaware of his heart rate or rhythm, he denies chest pain, he has chronic unchanged difficulty breathing.  No cough.  No fever or chills.  He denies falls.  No history of GI bleeds.  No history of heart problems.  He very occasionally drinks alcohol maybe once a month.    Clinical Impression  Patient functioning near baseline for functional mobility and gait, had difficulty sitting up at bedside due to generalized, once feet demonstrates good return for use of SPC and RW during ambulation without loss of balance, required sitting rest breaks before ambulating back to room, on room air with SpO2 at 94%, HR 104 BPM.  Patient tolerated sitting up in chair after therapy - RN notified.  Patient will benefit from continued physical therapy in hospital and recommended venue below to increase strength, balance, endurance for safe ADLs and gait.      Follow Up Recommendations Outpatient PT    Equipment Recommendations  None recommended by PT    Recommendations for Other Services       Precautions / Restrictions Precautions Precautions: Fall Restrictions Weight Bearing  Restrictions: No      Mobility  Bed Mobility Overal bed mobility: Needs Assistance Bed Mobility: Supine to Sit     Supine to sit: Min assist     General bed mobility comments: increased time, labored movement for sitting up at bedside  Transfers Overall transfer level: Needs assistance Equipment used: Rolling walker (2 wheeled);Straight cane Transfers: Sit to/from [facility] Sit to Stand: Supervision Stand pivot transfers: Supervision       General transfer comment: slightly labored, increased time  Ambulation/Gait Ambulation/Gait assistance: Supervision Gait Distance (Feet): 75 Feet Assistive device: Straight cane;Rolling walker (2 wheeled) Gait Pattern/deviations: Decreased step length - right;Step-to pattern;Decreased step length - left;Decreased stride length Gait velocity: decreased   General Gait Details: slightly labored slow cadence with mostly 3 point gait pattern using SPC, once fatigued had to sit and rest, requested use of RW to ambulate back to room with good return demonstrated and no loss of balance  Stairs            Wheelchair Mobility    Modified Rankin (Stroke Patients Only)       Balance Overall balance assessment: Needs assistance Sitting-balance support: Feet supported;No upper extremity supported Sitting balance-Leahy Scale: Good Sitting balance - Comments: seated at EOB   Standing balance support: During functional activity;Single extremity supported Standing balance-Leahy Scale: Fair Standing balance comment: fair/good using SPC, good using RW  Pertinent Vitals/Pain Pain Assessment: No/denies pain    Home Living Family/patient expects to be discharged to:: Private residence Living Arrangements: Spouse/significant other Available Help at Discharge: Family;Available PRN/intermittently(spouse recovering from a stroke, his son can help) Type of Home: House Home Access:  Ramped entrance     Home Layout: One level Home Equipment: Wheelchair - power;Shower seat - built in;Bedside commode;Cane - single point;Walker - 2 wheels      Prior Function Level of Independence: Independent with assistive device(s)         Comments: Household and short distanced community ambulator with [facility] or RW     Hand Dominance        Extremity/Trunk Assessment   Upper Extremity Assessment Upper Extremity Assessment: Overall WFL for tasks assessed    Lower Extremity Assessment Lower Extremity Assessment: Generalized weakness    Cervical / Trunk Assessment Cervical / Trunk Assessment: Kyphotic  Communication   Communication: No difficulties  Cognition Arousal/Alertness: Awake/alert Behavior During Therapy: WFL for tasks assessed/performed Overall Cognitive Status: Within Functional Limits for tasks assessed                                        General Comments      Exercises     Assessment/Plan    PT Assessment Patient needs continued PT services  PT Problem List Decreased strength;Decreased activity tolerance;Decreased balance;Decreased mobility       PT Treatment Interventions Balance training;Gait training;Stair training;Functional mobility training;Therapeutic activities;Therapeutic exercise;Patient/family education    PT Goals (Current goals can be found in the Care Plan section)  Acute Rehab PT Goals Patient Stated Goal: return home with family to assist PT Goal Formulation: With patient Time For Goal Achievement: 05/16/19 Potential to Achieve Goals: Good    Frequency Min 3X/week   Barriers to discharge        Co-evaluation               AM-PAC PT "6 Clicks" Mobility  Outcome Measure Help needed turning from your back to your side while in a flat bed without using bedrails?: None Help needed moving from lying on your back to sitting on the side of a flat bed without using bedrails?: A Little Help needed  moving to and from a bed to a chair (including a wheelchair)?: None Help needed standing up from a chair using your arms (e.g., wheelchair or bedside chair)?: None Help needed to walk in hospital room?: A Little Help needed climbing 3-5 steps with a railing? : A Little 6 Click Score: 21    End of Session   Activity Tolerance: Patient tolerated treatment well;Patient limited by fatigue Patient left: in chair;with call bell/phone within reach Nurse Communication: Mobility status PT Visit Diagnosis: Unsteadiness on feet (R26.81);Other abnormalities of gait and mobility (R26.89);Muscle weakness (generalized) (M62.81)    Time: [id number] PT Time Calculation (min) (ACUTE ONLY): 23 min   Charges:   PT Evaluation $PT Eval Moderate Complexity: 1 Mod PT Treatments $Therapeutic Activity: 23-37 mins        9:40 AM, 05/13/19 Bryan Hamilton, MPT Physical Therapist with [facility] 336 [phone] office [phone] mobile phone

## 2019-05-13 NOTE — Progress Notes (Signed)
*  PRELIMINARY RESULTS* Echocardiogram 2D Echocardiogram has been performed.  Leavy Cella 05/13/2019, 12:00 PM

## 2019-05-13 NOTE — Progress Notes (Addendum)
PROGRESS NOTE  Bryan Hamilton [id number] DOB: 11/22/1945 DOA: 05/12/2019 PCP: Celene Squibb, MD  Brief History:  74 year old male with a history of COPD, hypertension, chronic back pain, hyperlipidemia presenting with 2-week history of right hip pain.  The patient states that it started after he felt like he pulled something in his back after getting into his truck bed.  He stated that after he got out of his truck he had right hip pain.  He is also been complaining of his right leg "giving way".  At baseline, the patient uses a cane.  He has had to intermittently use a walker since his injury.  He denies any falling or syncope.  He has occasional radicular type symptoms shooting down to his right knee lasting only a few seconds.  He denies any frank back pain, urinary or bowel incontinence.  He went to see his primary care provider.  He stated that he was placed on a steroid taper with some mild improvement, but his pain worsened after the taper was finished.  He denies any fevers, chills, chest pain, worsening shortness of breath, nausea, vomiting, diarrhea.  Because of his worsening pain and right leg dragging, he presented for further evaluation.  In the emergency department, the patient was noted to have atrial fibrillation with RVR with heart rate in the 130s.  He was started on diltiazem drip and admitted to the stepdown unit.  Cardiology was consulted to assist with management.  The patient has been afebrile and hemodynamically stable with oxygen saturation 93% on room air.  X-ray of the lumbar spine showed multilevel DJD.  X-ray of the right pelvis was negative for any acute osseous abnormality.  Assessment/Plan: New onset atrial fibrillation with RVR, type unspecified -Initially placed on IV diltiazem drip -Transition to oral diltiazem -CHADS-VASc--4 (HTN, Age, ASVD) -Start apixaban -TSH 1.764 -Echocardiogram -Personally reviewed EKG--atrial fibrillation, no ST-T wave  change  Right hip pain/back pain -Patient cannot tolerate MRI  -CT L-spine -PT eval -Continue muscle relaxant  Essential hypertension -Holding lisinopril to allow blood pressure margin for Cardizem titration  COPD -Stable on room air -Continue Trelegy  Hyperlipidemia -Continue statin       Disposition Plan: Patient From: Home D/C Place: Home- 4/24 if stable Barriers: Not Clinically Stable--still on diltiazem drip  Family Communication:   Family at bedside  Consultants:  cardiology  Code Status:  FULL  DVT Prophylaxis:  Apixaban   Procedures: As Listed in Progress Note Above  Antibiotics: None       Subjective: Patient states that his right hip pain is little better.  He denies any fevers, chills, chest pain, shortness breath, cough, hemoptysis, nausea, vomiting, direct abdominal pain.  There is no dysuria or hematuria.  Objective: Vitals:   05/13/19 0500 05/13/19 0700 05/13/19 0715 05/13/19 0730  BP:  136/79 (!) 104/58 (!) 127/94  Pulse:  (!) 103 (!) 108 86  Resp:  15 17 (!) 27  Temp:      TempSrc:      SpO2:  95% 96% 96%  Weight: 93.4 kg     Height:        Intake/Output Summary (Last 24 hours) at 05/13/2019 0743 Last data filed at 05/13/2019 0500 Gross per 24 hour  Intake 17.12 ml  Output 500 ml  Net -482.88 ml   Weight change:  Exam:   General:  Pt is alert, follows commands appropriately, not in acute distress  HEENT:  No icterus, No thrush, No neck mass, Foley/AT  Cardiovascular: RRR, S1/S2, no rubs, no gallops  Respiratory: Diminished breath sounds, but CTA bilaterally, no wheezing, no crackles, no rhonchi  Abdomen: Soft/+BS, non tender, non distended, no guarding  Extremities: No edema, No lymphangitis, No petechiae, No rashes, no synovitis  -Neuro:  CN II-XII intact, strength 4/5 in RUE, RLE, strength 4/5 LUE, LLE; sensation intact bilateral; no dysmetria; babinski equivocal;  Negative straight leg raise   Data Reviewed: I  have personally reviewed following labs and imaging studies Basic Metabolic Panel: Recent Labs  Lab 05/12/19 1423  NA 137  K 4.1  CL 102  CO2 23  GLUCOSE 105*  BUN 23  CREATININE 1.10  CALCIUM 9.1  MG 2.0   Liver Function Tests: Recent Labs  Lab 05/12/19 1423  AST 24  ALT 23  ALKPHOS 49  BILITOT 0.8  PROT 7.0  ALBUMIN 3.9   No results for input(s): LIPASE, AMYLASE in the last 168 hours. No results for input(s): AMMONIA in the last 168 hours. Coagulation Profile: Recent Labs  Lab 05/12/19 1423  INR 1.2   CBC: Recent Labs  Lab 05/12/19 1423  WBC 10.7*  NEUTROABS 8.1*  HGB 14.6  HCT 45.5  MCV 97.0  PLT 195   Cardiac Enzymes: No results for input(s): CKTOTAL, CKMB, CKMBINDEX, TROPONINI in the last 168 hours. BNP: Invalid input(s): POCBNP CBG: No results for input(s): GLUCAP in the last 168 hours. HbA1C: No results for input(s): HGBA1C in the last 72 hours. Urine analysis: No results found for: COLORURINE, APPEARANCEUR, LABSPEC, PHURINE, GLUCOSEU, HGBUR, BILIRUBINUR, KETONESUR, PROTEINUR, UROBILINOGEN, NITRITE, LEUKOCYTESUR Sepsis Labs: @LABRCNTIP (procalcitonin:4,lacticidven:4) ) Recent Results (from the past 240 hour(s))  Respiratory Panel by RT PCR (Flu A&B, Covid) - Nasopharyngeal Swab     Status: None   Collection Time: 05/12/19  5:20 PM   Specimen: Nasopharyngeal Swab  Result Value Ref Range Status   SARS Coronavirus 2 by RT PCR NEGATIVE NEGATIVE Final    Comment: (NOTE) SARS-CoV-2 target nucleic acids are NOT DETECTED. The SARS-CoV-2 RNA is generally detectable in upper respiratoy specimens during the acute phase of infection. The lowest concentration of SARS-CoV-2 viral copies this assay can detect is 131 copies/mL. A negative result does not preclude SARS-Cov-2 infection and should not be used as the sole basis for treatment or other patient management decisions. A negative result may occur with  improper specimen collection/handling,  submission of specimen other than nasopharyngeal swab, presence of viral mutation(s) within the areas targeted by this assay, and inadequate number of viral copies (<131 copies/mL). A negative result must be combined with clinical observations, patient history, and epidemiological information. The expected result is Negative. Fact Sheet for Patients:  PinkCheek.be Fact Sheet for Healthcare Providers:  GravelBags.it This test is not yet ap proved or cleared by the [place] FDA and  has been authorized for detection and/or diagnosis of SARS-CoV-2 by FDA under an Emergency Use Authorization (EUA). This EUA will remain  in effect (meaning this test can be used) for the duration of the COVID-19 declaration under Section 564(b)(1) of the Act, 21 U.S.C. section 360bbb-3(b)(1), unless the authorization is terminated or revoked sooner.    Influenza A by PCR NEGATIVE NEGATIVE Final   Influenza B by PCR NEGATIVE NEGATIVE Final    Comment: (NOTE) The Xpert Xpress SARS-CoV-2/FLU/RSV assay is intended as an aid in  the diagnosis of influenza from Nasopharyngeal swab specimens and  should not be used as a sole basis for treatment.  Nasal washings and  aspirates are unacceptable for Xpert Xpress SARS-CoV-2/FLU/RSV  testing. Fact Sheet for Patients: PinkCheek.be Fact Sheet for Healthcare Providers: GravelBags.it This test is not yet approved or cleared by the [place] FDA and  has been authorized for detection and/or diagnosis of SARS-CoV-2 by  FDA under an Emergency Use Authorization (EUA). This EUA will remain  in effect (meaning this test can be used) for the duration of the  Covid-19 declaration under Section 564(b)(1) of the Act, 21  U.S.C. section 360bbb-3(b)(1), unless the authorization is  terminated or revoked. Performed at [facility], [address],  [place], [place] [address]      Scheduled Meds: . apixaban  5 mg Oral BID  . atorvastatin  20 mg Oral Daily  . Chlorhexidine Gluconate Cloth  6 each Topical Daily  . Fluticasone-Umeclidin-Vilant  1 puff Inhalation Daily  . methocarbamol  500 mg Oral TID   Continuous Infusions: . diltiazem (CARDIZEM) infusion 10 mg/hr (05/13/19 0615)    Procedures/Studies: DG Lumbar Spine 2-3 Views  Result Date: 05/12/2019 CLINICAL DATA:  74 year old male with right hip pain. EXAM: LUMBAR SPINE - 2-3 VIEW COMPARISON:  CT abdomen pelvis dated 10/20/2017. FINDINGS: Five lumbar type vertebra. There is no acute fracture or subluxation of the lumbar spine. Advanced multilevel degenerative changes with disc space narrowing, endplate irregularity, and osteophyte/spurring. Chronic appearing compression fracture of L2. There is resulting lumbar scoliosis. The visualized posterior elements are intact. Multilevel facet arthropathy. There is atherosclerotic calcification of the aorta. The aorta appears ectatic. IMPRESSION: 1. No acute fracture or subluxation. 2. 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 Electronically Signed   By: Anner Crete M.D.   On: 05/12/2019 18:55   DG Chest Portable 1 View  Result Date: 05/12/2019 CLINICAL DATA:  Atrial fibrillation. EXAM: PORTABLE CHEST 1 VIEW COMPARISON:  Jun 02, 2011. FINDINGS: The heart size and mediastinal contours are within normal limits. No pneumothorax or pleural effusion is noted. Mild left basilar atelectasis or infiltrate is noted. The visualized skeletal structures are unremarkable. IMPRESSION: Mild left basilar atelectasis or infiltrate is noted. Electronically Signed   By: Marijo Conception M.D.   On: 05/12/2019 14:37   DG Hip Unilat W or Wo Pelvis 2-3 Views Right  Result Date: 05/12/2019 CLINICAL DATA:  Right hip pain, began after getting into a lifted truck 2 weeks prior. EXAM: DG HIP (WITH OR WITHOUT PELVIS) 2-3V RIGHT COMPARISON:  None.  FINDINGS: No acute bony abnormality. Specifically, no fracture, subluxation, or dislocation. No abnormal diastatic widening of the SI joints or symphysis pubis. Femoral heads are normally located. Moderate degenerative changes noted in the lower lumbar spine with milder changes at the SI joints and symphysis pubis. Monitoring leads overlie the included portions of the abdomen and pelvis. Normal bowel gas pattern. Vascular calcium noted in the medial thighs. Soft tissues otherwise unremarkable. IMPRESSION: No acute bony abnormality. Degenerative changes in the lower lumbar spine, hips and SI joints. Electronically Signed   By: Lovena Le M.D.   On: 05/12/2019 15:20    Orson Eva, DO  [facility]  If 7PM-7AM, please contact night-coverage [email or web address] Password [email or web address] 05/13/2019, 7:43 AM   LOS: 1 day

## 2019-05-14 MED ORDER — TRAMADOL HCL 50 MG PO TABS: 50.0000 mg | ORAL_TABLET | Freq: Four times a day (QID) | ORAL | Status: DC | PRN

## 2019-05-14 MED ORDER — METHOCARBAMOL 500 MG PO TABS: 500.0000 mg | ORAL_TABLET | Freq: Three times a day (TID) | ORAL | Status: DC | PRN

## 2019-05-14 MED ORDER — PREDNISONE 10 MG PO TABS: 60.0000 mg | ORAL_TABLET | Freq: Every day | ORAL | 0 refills | Status: DC

## 2019-05-14 MED ORDER — TRAMADOL HCL 50 MG PO TABS: 50.0000 mg | ORAL_TABLET | Freq: Four times a day (QID) | ORAL | 0 refills | Status: DC | PRN

## 2019-05-14 MED ORDER — DILTIAZEM HCL ER COATED BEADS 240 MG PO CP24
240.0000 mg | ORAL_CAPSULE | Freq: Every day | ORAL | Status: DC
  Administered 2019-05-14: 240 mg via ORAL
  Filled 2019-05-14: qty 1

## 2019-05-14 MED ORDER — METHOCARBAMOL 500 MG PO TABS: 500.0000 mg | ORAL_TABLET | Freq: Three times a day (TID) | ORAL | 0 refills | Status: DC | PRN

## 2019-05-14 MED ORDER — DILTIAZEM HCL ER COATED BEADS 240 MG PO CP24: 240.0000 mg | ORAL_CAPSULE | Freq: Every day | ORAL | 1 refills | Status: DC

## 2019-05-14 MED ORDER — APIXABAN 5 MG PO TABS: 5.0000 mg | ORAL_TABLET | Freq: Two times a day (BID) | ORAL | 1 refills | Status: DC

## 2019-05-14 MED ORDER — PREDNISONE 20 MG PO TABS: 60.0000 mg | ORAL_TABLET | Freq: Every day | ORAL | Status: DC

## 2019-05-14 MED ORDER — METHOCARBAMOL 500 MG PO TABS
500.0000 mg | ORAL_TABLET | Freq: Once | ORAL | Status: AC
  Administered 2019-05-14: 500 mg via ORAL
  Filled 2019-05-14: qty 1

## 2019-05-14 NOTE — Progress Notes (Signed)
Patient alert and oriented x4. No complaints of pain, shortness of breath, chest pain, dizziness, nausea or vomiting. Patient up out of bed, ambulatory with cane in room to commode and chair, gait steady. Heart rate remained between 70's to low 100's. Patient tolerated PO meds and diet well. Appetite good. IV's removed without complication, pressure applied. Patient educated on eliquis and to monitor for bleeding, increased bruising and/or falls. Patient and daughter expressed understanding with teach back. Discharge instructions, follow up appointment(s) information and medication education gone over with patient and patient's daughter. All questions answered and both patient and daughter expressed full understanding of instructions, appointment information and education with teach back. Patient discharged home with all belongings via car (daughter driving patient home).

## 2019-05-14 NOTE — Discharge Summary (Signed)
Physician Discharge Summary  Bryan Hamilton [id number] DOB: [birth year] DOA: 05/12/2019  PCP: Celene Squibb, MD  Admit date: 05/12/2019 Discharge date: 05/14/2019  Admitted From: Home Disposition:  Home   Recommendations for Outpatient Follow-up:  1. Follow up with PCP in 1-2 weeks 2. Please obtain BMP/CBC in one week     Discharge Condition: Stable CODE STATUS: FULL Diet recommendation: Heart Healthy    Brief/Interim Summary: 74 year old male with a history of COPD, hypertension, chronic back pain, hyperlipidemia presenting with 2-week history of right hip pain.  The patient states that it started after he felt like he pulled something in his back after getting into his truck bed.  He stated that after he got out of his truck he had right hip pain.  He is also been complaining of his right leg "giving way".  At baseline, the patient uses a cane.  He has had to intermittently use a walker since his injury.  He denies any falling or syncope.  He has occasional radicular type symptoms shooting down to his right knee lasting only a few seconds.  He denies any frank back pain, urinary or bowel incontinence.  He went to see his primary care provider.  He stated that he was placed on a steroid taper with some mild improvement, but his pain worsened after the taper was finished.  He denies any fevers, chills, chest pain, worsening shortness of breath, nausea, vomiting, diarrhea.  Because of his worsening pain and right leg dragging, he presented for further evaluation.  In the emergency department, the patient was noted to have atrial fibrillation with RVR with heart rate in the 130s.  He was started on diltiazem drip and admitted to the stepdown unit.  Cardiology was consulted to assist with management.  The patient has been afebrile and hemodynamically stable with oxygen saturation 93% on room air.  X-ray of the lumbar spine showed multilevel DJD.  X-ray of the right pelvis was negative for any  acute osseous abnormality.   Discharge Diagnoses:  New onset atrial fibrillation with RVR, type unspecified -Initially placed on IV diltiazem drip -Transition to oral diltiazem-->diltiazem CD 240 mg daily -rate controlled on day of d/c -CHADS-VASc--4 (HTN, Age, ASVD) -Continue apixaban -TSH 1.764 -Echocardiogram--EF 60-65%, not optimal to evaluate wall motion -Personally reviewed EKG--atrial fibrillation, no ST-T wave change  Right hip pain/back pain/Lumbar Spinal Stenosis -Patient cannot tolerate MRI  -CT L-spine--Severe right foraminal stenosis at L5-S1 which could affect the right L5 nerve.  Severe spinal stenosis at L3-4 and L4-5, worst at L4-5 -PT eval-->outpatient PT -Continue muscle relaxant -case discussed with neurosurgery, Dr. Delle Reining nonoperative management and follow up in office -d/c home with prednisone taper, robaxin, prn tramadol  Essential hypertension -Holding lisinopril to allow blood pressure margin for Cardizem titration  COPD -Stable on room air -Continue Trelegy  Hyperlipidemia -Continue statin    Discharge Instructions   Allergies as of 05/14/2019   No Known Allergies     Medication List    STOP taking these medications   lisinopril 10 MG tablet Commonly known as: ZESTRIL     TAKE these medications   acetaminophen 650 MG CR tablet Commonly known as: TYLENOL Take 1,300 mg by mouth 2 (two) times daily as needed for pain.   apixaban 5 MG Tabs tablet Commonly known as: ELIQUIS Take 1 tablet (5 mg total) by mouth 2 (two) times daily.   atorvastatin 20 MG tablet Commonly known as: LIPITOR Take 1 tablet by mouth daily.  diltiazem 240 MG 24 hr capsule Commonly known as: CARDIZEM CD Take 1 capsule (240 mg total) by mouth daily.   methocarbamol 500 MG tablet Commonly known as: ROBAXIN Take 1 tablet (500 mg total) by mouth every 8 (eight) hours as needed for muscle spasms.   OMEGA 3 PO Take 6 capsules by mouth daily.    predniSONE 10 MG tablet Commonly known as: DELTASONE Take 6 tablets (60 mg total) by mouth daily with breakfast. And decrease by one tablet daily Start taking on: May 15, 2019   ProAir HFA 108 (90 Base) MCG/ACT inhaler Generic drug: albuterol Inhale 2 puffs into the lungs every 6 (six) hours as needed.   Systane 0.4-0.3 % Soln Generic drug: Polyethyl Glycol-Propyl Glycol Place 1-2 drops into both eyes daily as needed.   traMADol 50 MG tablet Commonly known as: ULTRAM Take 1 tablet (50 mg total) by mouth every 6 (six) hours as needed for moderate pain.   Trelegy Ellipta 100-62.5-25 MCG/INH Aepb Generic drug: Fluticasone-Umeclidin-Vilant Inhale 1 puff into the lungs daily.      Follow-up Information    Erma Heritage, PA-C Follow up on 05/27/2019.   Specialties: Physician Assistant, Cardiology Why: [facility] Follow-up on 05/27/2019 at 2:30 PM. Contact information: [place] [address] [phone]        Earnie Larsson, MD Follow up in 1 week(s).   Specialty: Neurosurgery Contact information: [address] [place] [address] [phone]          No Known Allergies  Consultations:  Cardiology  neurosurgery   Procedures/Studies: DG Lumbar Spine 2-3 Views  Result Date: 05/12/2019 CLINICAL DATA:  74 year old male with right hip pain. EXAM: LUMBAR SPINE - 2-3 VIEW COMPARISON:  CT abdomen pelvis dated 10/20/2017. FINDINGS: Five lumbar type vertebra. There is no acute fracture or subluxation of the lumbar spine. Advanced multilevel degenerative changes with disc space narrowing, endplate irregularity, and osteophyte/spurring. Chronic appearing compression fracture of L2. There is resulting lumbar scoliosis. The visualized posterior elements are intact. Multilevel facet arthropathy. There is atherosclerotic calcification of the aorta. The aorta appears ectatic. IMPRESSION: 1. No acute fracture or subluxation. 2. Advanced  multilevel degenerative changes of the lumbar spine. Electronically Signed   By: Anner Crete M.D.   On: 05/12/2019 18:55   CT LUMBAR SPINE WO CONTRAST  Result Date: 05/13/2019 CLINICAL DATA:  Low back pain and right hip pain and right leg weakness. EXAM: CT LUMBAR SPINE WITHOUT CONTRAST TECHNIQUE: Multidetector CT imaging of the lumbar spine was performed without intravenous contrast administration. Multiplanar CT image reconstructions were also generated. COMPARISON:  Radiographs dated 05/12/2019 and CT scan of the abdomen dated 10/20/2017 FINDINGS: Segmentation: 5 lumbar type vertebra. Alignment: Moderately severe lumbar scoliosis with convexity to the right centered at L2 and straightening of the upper lumbar lordosis, unchanged since the prior CT scan dated 10/20/2017. Vertebrae: There is no fracture mass lesion. Paraspinal and other soft tissues: No acute abnormalities. Aortic atherosclerosis. Small adenomas on the right adrenal gland, unchanged. Disc levels: T11-12: No disc bulging or protrusion. Slight right facet arthritis. T12-L1: Disc space narrowing with disc degeneration. No significant disc bulging or disc protrusion. Moderate left and mild right facet arthritis. L1-2: Severe disc degeneration with disc space narrowing, vacuum phenomenon, and sclerosis of the vertebral endplates. Broad-based endplate osteophytes symmetrically compress the thecal sac creating moderate spinal stenosis. Severe bilateral facet arthritis. Slight left foraminal stenosis. L2-3: Severe disc space narrowing with degenerative changes of the vertebral  endplates. Broad-based endplate osteophytes compress the thecal sac asymmetric to the left severe narrowing of the left lateral recess which might affect the left L3 nerve. Slight left foraminal stenosis. Mild bilateral facet arthritis. Moderate spinal stenosis. L3-4: Severe disc degeneration with a vacuum phenomenon. Broad-based disc bulge with accompanying endplate  osteophytes asymmetric to the left severely compressing the left lateral recess. Fairly severe spinal stenosis. Slight bilateral facet arthritis. Moderate right foraminal stenosis. L4-5: Disc degeneration with a vacuum phenomenon. Prominent partially calcified broad-based disc protrusion severely compressing the thecal sac, combined with hypertrophy of the ligamentum flavum and facet joints. Moderate bilateral foraminal stenosis. Severe right and mild left facet arthritis. L5-S1: Severe disc degeneration with disc space narrowing and a vacuum phenomenon. Calcified broad-based disc protrusion asymmetric to the right compressing the right S1 nerve. Severe bilateral facet arthritis. Severe right foraminal stenosis which could affect the right L5 nerve. IMPRESSION: 1. Severe spinal stenosis at L3-4 and L4-5, worst at L4-5. 2. Severe narrowing of the left lateral recess at L2-3 which might affect the left L3 nerve. 3. Severe right foraminal stenosis at L5-S1 which could affect the right L5 nerve. 4. Prominent disc protrusions at L4-5 and L5-S1 as described above. 5. Aortic atherosclerosis. Aortic Atherosclerosis (ICD10-I70.0). Electronically Signed   By: Lorriane Shire M.D.   On: 05/13/2019 13:56   DG Chest Portable 1 View  Result Date: 05/12/2019 CLINICAL DATA:  Atrial fibrillation. EXAM: PORTABLE CHEST 1 VIEW COMPARISON:  Jun 02, 2011. FINDINGS: The heart size and mediastinal contours are within normal limits. No pneumothorax or pleural effusion is noted. Mild left basilar atelectasis or infiltrate is noted. The visualized skeletal structures are unremarkable. IMPRESSION: Mild left basilar atelectasis or infiltrate is noted. Electronically Signed   By: Marijo Conception M.D.   On: 05/12/2019 14:37   ECHOCARDIOGRAM COMPLETE  Result Date: 05/13/2019    ECHOCARDIOGRAM REPORT   Patient Name:   Bryan Hamilton Date of Exam: 05/13/2019 Medical Rec #:  [id number]         Height:       68.0 in Accession #:    [id number]         Weight:       205.9 lb Date of Birth:  February 26, 1945         BSA:          2.069 m Patient Age:    63 years          BP:           111/68 mmHg Patient Gender: M                 HR:           104 bpm. Exam Location:  [facility] Procedure: 2D Echo Indications:    Atrial Fibrillation 427.31 / I48.91  History:        Patient has no prior history of Echocardiogram examinations.                 COPD, Arrythmias:Atrial Fibrillation; Risk Factors:Former                 Smoker, Hypertension and Dyslipidemia. Right hip pain.  Sonographer:    Leavy Cella RDCS (AE) Referring Phys: [place]  1. Left ventricular ejection fraction, by estimation, is 60 to 65%. The left ventricle has normal function. Left ventricular endocardial border not optimally defined to evaluate regional wall motion. There is mild left ventricular hypertrophy. Left ventricular diastolic  parameters are indeterminate.  2. Right ventricular systolic function is normal. The right ventricular size is mildly enlarged. There is moderately elevated pulmonary artery systolic pressure.  3. Left atrial size was mildly dilated.  4. The mitral valve is normal in structure. No evidence of mitral valve regurgitation. No evidence of mitral stenosis.  5. The aortic valve is tricuspid. Aortic valve regurgitation is not visualized. No aortic stenosis is present.  6. The inferior vena cava is normal in size with greater than 50% respiratory variability, suggesting right atrial pressure of 3 mmHg. FINDINGS  Left Ventricle: Left ventricular ejection fraction, by estimation, is 60 to 65%. The left ventricle has normal function. Left ventricular endocardial border not optimally defined to evaluate regional wall motion. The left ventricular internal cavity size was normal in size. There is mild left ventricular hypertrophy. Left ventricular diastolic parameters are indeterminate. Right Ventricle: The right ventricular size is mildly enlarged. No  increase in right ventricular wall thickness. Right ventricular systolic function is normal. There is moderately elevated pulmonary artery systolic pressure. The tricuspid regurgitant velocity is 2.89 m/s, and with an assumed right atrial pressure of 10 mmHg, the estimated right ventricular systolic pressure is [id number] mmHg. Left Atrium: Left atrial size was mildly dilated. Right Atrium: Right atrial size was normal in size. Pericardium: There is no evidence of pericardial effusion. Mitral Valve: The mitral valve is normal in structure. No evidence of mitral valve regurgitation. No evidence of mitral valve stenosis. Tricuspid Valve: The tricuspid valve is normal in structure. Tricuspid valve regurgitation is mild . No evidence of tricuspid stenosis. Aortic Valve: Technically diffiicult Doppler evaluation of the AV. Grossly no significant stenosis or regurgitation. The aortic valve is tricuspid. Aortic valve regurgitation is not visualized. No aortic stenosis is present. Pulmonic Valve: The pulmonic valve was not well visualized. Pulmonic valve regurgitation is not visualized. No evidence of pulmonic stenosis. Aorta: The aortic root is normal in size and structure. Pulmonary Artery: Mild pulmonary HTN, PASP is 36 mmHg. Venous: The inferior vena cava is normal in size with greater than 50% respiratory variability, suggesting right atrial pressure of 3 mmHg. IAS/Shunts: No atrial level shunt detected by color flow Doppler.  LEFT VENTRICLE PLAX 2D LVIDd:         4.66 cm  Diastology LVIDs:         2.44 cm  LV e' lateral:   10.90 cm/s LV PW:         1.26 cm  LV E/e' lateral: 10.4 LV IVS:        1.12 cm  LV e' medial:    7.72 cm/s LVOT diam:     2.00 cm  LV E/e' medial:  14.6 LVOT Area:     3.14 cm  RIGHT VENTRICLE RV S prime:     12.40 cm/s TAPSE (M-mode): 1.4 cm LEFT ATRIUM             Index LA diam:        4.20 cm 2.03 cm/m LA Vol (A2C):   73.0 ml 35.28 ml/m LA Vol (A4C):   47.8 ml 23.10 ml/m LA Biplane Vol: 64.5 ml  31.17 ml/m   AORTA Ao Root diam: 2.50 cm MITRAL VALVE                TRICUSPID VALVE MV Area (PHT): 4.15 cm     TR Peak grad:   33.4 mmHg MV Decel Time: 183 msec     TR Vmax:  289.00 cm/s MV E velocity: 113.00 cm/s                             SHUNTS                             Systemic Diam: 2.00 cm Carlyle Dolly MD Electronically signed by Carlyle Dolly MD Signature Date/Time: 05/13/2019/1:39:44 PM    Final    DG Hip Unilat W or Wo Pelvis 2-3 Views Right  Result Date: 05/12/2019 CLINICAL DATA:  Right hip pain, began after getting into a lifted truck 2 weeks prior. EXAM: DG HIP (WITH OR WITHOUT PELVIS) 2-3V RIGHT COMPARISON:  None. FINDINGS: No acute bony abnormality. Specifically, no fracture, subluxation, or dislocation. No abnormal diastatic widening of the SI joints or symphysis pubis. Femoral heads are normally located. Moderate degenerative changes noted in the lower lumbar spine with milder changes at the SI joints and symphysis pubis. Monitoring leads overlie the included portions of the abdomen and pelvis. Normal bowel gas pattern. Vascular calcium noted in the medial thighs. Soft tissues otherwise unremarkable. IMPRESSION: No acute bony abnormality. Degenerative changes in the lower lumbar spine, hips and SI joints. Electronically Signed   By: Lovena Le M.D.   On: 05/12/2019 15:20         Discharge Exam: Vitals:   05/14/19 1000 05/14/19 1102  BP: 101/72 132/73  Pulse: 92 77  Resp:  16  Temp:  (!) 97.5 F (36.4 C)  SpO2: 93% 96%   Vitals:   05/14/19 0813 05/14/19 0920 05/14/19 1000 05/14/19 1102  BP:  (!) 108/55 101/72 132/73  Pulse:  91 92 77  Resp:  17  16  Temp:    (!) 97.5 F (36.4 C)  TempSrc:    Oral  SpO2: 93% 92% 93% 96%  Weight:      Height:        General: Pt is alert, awake, not in acute distress Cardiovascular: IRRR, S1/S2 +, no rubs, no gallops Respiratory: diminished BS.  No wheeze Abdominal: Soft, NT, ND, bowel sounds + Extremities: no  edema, no cyanosis   The results of significant diagnostics from this hospitalization (including imaging, microbiology, ancillary and laboratory) are listed below for reference.    Significant Diagnostic Studies: DG Lumbar Spine 2-3 Views  Result Date: 05/12/2019 CLINICAL DATA:  74 year old male with right hip pain. EXAM: LUMBAR SPINE - 2-3 VIEW COMPARISON:  CT abdomen pelvis dated 10/20/2017. FINDINGS: Five lumbar type vertebra. There is no acute fracture or subluxation of the lumbar spine. Advanced multilevel degenerative changes with disc space narrowing, endplate irregularity, and osteophyte/spurring. Chronic appearing compression fracture of L2. There is resulting lumbar scoliosis. The visualized posterior elements are intact. Multilevel facet arthropathy. There is atherosclerotic calcification of the aorta. The aorta appears ectatic. IMPRESSION: 1. No acute fracture or subluxation. 2. 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 Electronically Signed   By: Anner Crete M.D.   On: 05/12/2019 18:55   CT LUMBAR SPINE WO CONTRAST  Result Date: 05/13/2019 CLINICAL DATA:  Low back pain and right hip pain and right leg weakness. EXAM: CT LUMBAR SPINE WITHOUT CONTRAST TECHNIQUE: Multidetector CT imaging of the lumbar spine was performed without intravenous contrast administration. Multiplanar CT image reconstructions were also generated. COMPARISON:  Radiographs dated 05/12/2019 and CT scan of the abdomen dated 10/20/2017 FINDINGS: Segmentation: 5 lumbar type vertebra. Alignment: Moderately severe lumbar  scoliosis with convexity to the right centered at L2 and straightening of the upper lumbar lordosis, unchanged since the prior CT scan dated 10/20/2017. Vertebrae: There is no fracture mass lesion. Paraspinal and other soft tissues: No acute abnormalities. Aortic atherosclerosis. Small adenomas on the right adrenal gland, unchanged. Disc levels: T11-12: No disc bulging or protrusion.  Slight right facet arthritis. T12-L1: Disc space narrowing with disc degeneration. No significant disc bulging or disc protrusion. Moderate left and mild right facet arthritis. L1-2: Severe disc degeneration with disc space narrowing, vacuum phenomenon, and sclerosis of the vertebral endplates. Broad-based endplate osteophytes symmetrically compress the thecal sac creating moderate spinal stenosis. Severe bilateral facet arthritis. Slight left foraminal stenosis. L2-3: Severe disc space narrowing with degenerative changes of the vertebral endplates. Broad-based endplate osteophytes compress the thecal sac asymmetric to the left severe narrowing of the left lateral recess which might affect the left L3 nerve. Slight left foraminal stenosis. Mild bilateral facet arthritis. Moderate spinal stenosis. L3-4: Severe disc degeneration with a vacuum phenomenon. Broad-based disc bulge with accompanying endplate osteophytes asymmetric to the left severely compressing the left lateral recess. Fairly severe spinal stenosis. Slight bilateral facet arthritis. Moderate right foraminal stenosis. L4-5: Disc degeneration with a vacuum phenomenon. Prominent partially calcified broad-based disc protrusion severely compressing the thecal sac, combined with hypertrophy of the ligamentum flavum and facet joints. Moderate bilateral foraminal stenosis. Severe right and mild left facet arthritis. L5-S1: Severe disc degeneration with disc space narrowing and a vacuum phenomenon. Calcified broad-based disc protrusion asymmetric to the right compressing the right S1 nerve. Severe bilateral facet arthritis. Severe right foraminal stenosis which could affect the right L5 nerve. IMPRESSION: 1. Severe spinal stenosis at L3-4 and L4-5, worst at L4-5. 2. Severe narrowing of the left lateral recess at L2-3 which might affect the left L3 nerve. 3. Severe right foraminal stenosis at L5-S1 which could affect the right L5 nerve. 4. Prominent disc  protrusions at L4-5 and L5-S1 as described above. 5. Aortic atherosclerosis. Aortic Atherosclerosis (ICD10-I70.0). Electronically Signed   By: Lorriane Shire M.D.   On: 05/13/2019 13:56   DG Chest Portable 1 View  Result Date: 05/12/2019 CLINICAL DATA:  Atrial fibrillation. EXAM: PORTABLE CHEST 1 VIEW COMPARISON:  Jun 02, 2011. FINDINGS: The heart size and mediastinal contours are within normal limits. No pneumothorax or pleural effusion is noted. Mild left basilar atelectasis or infiltrate is noted. The visualized skeletal structures are unremarkable. IMPRESSION: Mild left basilar atelectasis or infiltrate is noted. Electronically Signed   By: Marijo Conception M.D.   On: 05/12/2019 14:37   ECHOCARDIOGRAM COMPLETE  Result Date: 05/13/2019    ECHOCARDIOGRAM REPORT   Patient Name:   Bryan Hamilton Date of Exam: 05/13/2019 Medical Rec #:  [id number]         Height:       68.0 in Accession #:    [id number]        Weight:       205.9 lb Date of Birth:  1945-02-07         BSA:          2.069 m Patient Age:    73 years          BP:           111/68 mmHg Patient Gender: M                 HR:           104 bpm. [facility] Procedure: 2D Echo Indications:    Atrial Fibrillation 427.31 / I48.91  History:        Patient has no prior history of Echocardiogram examinations.                 COPD, Arrythmias:Atrial Fibrillation; Risk Factors:Former                 Smoker, Hypertension and Dyslipidemia. Right hip pain.  Sonographer:    Leavy Cella RDCS (AE) Referring Phys: [place]  1. Left ventricular ejection fraction, by estimation, is 60 to 65%. The left ventricle has normal function. Left ventricular endocardial border not optimally defined to evaluate regional wall motion. There is mild left ventricular hypertrophy. Left ventricular diastolic parameters are indeterminate.  2. Right ventricular systolic function is normal. The right ventricular size is mildly enlarged.  There is moderately elevated pulmonary artery systolic pressure.  3. Left atrial size was mildly dilated.  4. The mitral valve is normal in structure. No evidence of mitral valve regurgitation. No evidence of mitral stenosis.  5. The aortic valve is tricuspid. Aortic valve regurgitation is not visualized. No aortic stenosis is present.  6. The inferior vena cava is normal in size with greater than 50% respiratory variability, suggesting right atrial pressure of 3 mmHg. FINDINGS  Left Ventricle: Left ventricular ejection fraction, by estimation, is 60 to 65%. The left ventricle has normal function. Left ventricular endocardial border not optimally defined to evaluate regional wall motion. The left ventricular internal cavity size was normal in size. There is mild left ventricular hypertrophy. Left ventricular diastolic parameters are indeterminate. Right Ventricle: The right ventricular size is mildly enlarged. No increase in right ventricular wall thickness. Right ventricular systolic function is normal. There is moderately elevated pulmonary artery systolic pressure. The tricuspid regurgitant velocity is 2.89 m/s, and with an assumed right atrial pressure of 10 mmHg, the estimated right ventricular systolic pressure is [id number] mmHg. Left Atrium: Left atrial size was mildly dilated. Right Atrium: Right atrial size was normal in size. Pericardium: There is no evidence of pericardial effusion. Mitral Valve: The mitral valve is normal in structure. No evidence of mitral valve regurgitation. No evidence of mitral valve stenosis. Tricuspid Valve: The tricuspid valve is normal in structure. Tricuspid valve regurgitation is mild . No evidence of tricuspid stenosis. Aortic Valve: Technically diffiicult Doppler evaluation of the AV. Grossly no significant stenosis or regurgitation. The aortic valve is tricuspid. Aortic valve regurgitation is not visualized. No aortic stenosis is present. Pulmonic Valve: The pulmonic valve was  not well visualized. Pulmonic valve regurgitation is not visualized. No evidence of pulmonic stenosis. Aorta: The aortic root is normal in size and structure. Pulmonary Artery: Mild pulmonary HTN, PASP is 36 mmHg. Venous: The inferior vena cava is normal in size with greater than 50% respiratory variability, suggesting right atrial pressure of 3 mmHg. IAS/Shunts: No atrial level shunt detected by color flow Doppler.  LEFT VENTRICLE PLAX 2D LVIDd:         4.66 cm  Diastology LVIDs:         2.44 cm  LV e' lateral:   10.90 cm/s LV PW:         1.26 cm  LV E/e' lateral: 10.4 LV IVS:        1.12 cm  LV e' medial:    7.72 cm/s LVOT diam:     2.00 cm  LV E/e' medial:  14.6 LVOT Area:     3.14 cm  RIGHT VENTRICLE RV S prime:     12.40 cm/s TAPSE (M-mode): 1.4 cm LEFT ATRIUM             Index LA diam:        4.20 cm 2.03 cm/m LA Vol (A2C):   73.0 ml 35.28 ml/m LA Vol (A4C):   47.8 ml 23.10 ml/m LA Biplane Vol: 64.5 ml 31.17 ml/m   AORTA Ao Root diam: 2.50 cm MITRAL VALVE                TRICUSPID VALVE MV Area (PHT): 4.15 cm     TR Peak grad:   33.4 mmHg MV Decel Time: 183 msec     TR Vmax:        289.00 cm/s MV E velocity: 113.00 cm/s                             SHUNTS                             Systemic Diam: 2.00 cm Carlyle Dolly MD Electronically signed by Carlyle Dolly MD Signature Date/Time: 05/13/2019/1:39:44 PM    Final    DG Hip Unilat W or Wo Pelvis 2-3 Views Right  Result Date: 05/12/2019 CLINICAL DATA:  Right hip pain, began after getting into a lifted truck 2 weeks prior. EXAM: DG HIP (WITH OR WITHOUT PELVIS) 2-3V RIGHT COMPARISON:  None. FINDINGS: No acute bony abnormality. Specifically, no fracture, subluxation, or dislocation. No abnormal diastatic widening of the SI joints or symphysis pubis. Femoral heads are normally located. Moderate degenerative changes noted in the lower lumbar spine with milder changes at the SI joints and symphysis pubis. Monitoring leads overlie the included portions of  the abdomen and pelvis. Normal bowel gas pattern. Vascular calcium noted in the medial thighs. Soft tissues otherwise unremarkable. IMPRESSION: No acute bony abnormality. Degenerative changes in the lower lumbar spine, hips and SI joints. Electronically Signed   By: Lovena Le M.D.   On: 05/12/2019 15:20     Microbiology: Recent Results (from the past 240 hour(s))  Respiratory Panel by RT PCR (Flu A&B, Covid) - Nasopharyngeal Swab     Status: None   Collection Time: 05/12/19  5:20 PM   Specimen: Nasopharyngeal Swab  Result Value Ref Range Status   SARS Coronavirus 2 by RT PCR NEGATIVE NEGATIVE Final    Comment: (NOTE) SARS-CoV-2 target nucleic acids are NOT DETECTED. The SARS-CoV-2 RNA is generally detectable in upper respiratoy specimens during the acute phase of infection. The lowest concentration of SARS-CoV-2 viral copies this assay can detect is 131 copies/mL. A negative result does not preclude SARS-Cov-2 infection and should not be used as the sole basis for treatment or other patient management decisions. A negative result may occur with  improper specimen collection/handling, submission of specimen other than nasopharyngeal swab, presence of viral mutation(s) within the areas targeted by this assay, and inadequate number of viral copies (<131 copies/mL). A negative result must be combined with clinical observations, patient history, and epidemiological information. The expected result is Negative. Fact Sheet for Patients:  PinkCheek.be Fact Sheet for Healthcare Providers:  GravelBags.it This test is not yet ap proved or cleared by the [place] FDA and  has been authorized for detection and/or diagnosis of SARS-CoV-2 by FDA under an Emergency Use Authorization (EUA). This EUA will remain  in effect (meaning this test can  be used) for the duration of the COVID-19 declaration under Section 564(b)(1) of the Act, 21  U.S.C. section 360bbb-3(b)(1), unless the authorization is terminated or revoked sooner.    Influenza A by PCR NEGATIVE NEGATIVE Final   Influenza B by PCR NEGATIVE NEGATIVE Final    Comment: (NOTE) The Xpert Xpress SARS-CoV-2/FLU/RSV assay is intended as an aid in  the diagnosis of influenza from Nasopharyngeal swab specimens and  should not be used as a sole basis for treatment. Nasal washings and  aspirates are unacceptable for Xpert Xpress SARS-CoV-2/FLU/RSV  testing. Fact Sheet for Patients: PinkCheek.be Fact Sheet for Healthcare Providers: GravelBags.it This test is not yet approved or cleared by the [place] FDA and  has been authorized for detection and/or diagnosis of SARS-CoV-2 by  FDA under an Emergency Use Authorization (EUA). This EUA will remain  in effect (meaning this test can be used) for the duration of the  Covid-19 declaration under Section 564(b)(1) of the Act, 21  U.S.C. section 360bbb-3(b)(1), unless the authorization is  terminated or revoked. Performed at [facility], [address] [place], [place] [address]   MRSA PCR Screening     Status: None   Collection Time: 05/12/19  9:30 PM   Specimen: Nasal Mucosa; Nasopharyngeal  Result Value Ref Range Status   MRSA by PCR NEGATIVE NEGATIVE Final    Comment:        The GeneXpert MRSA Assay (FDA approved for NASAL specimens only), is one component of a comprehensive MRSA colonization surveillance program. It is not intended to diagnose MRSA infection nor to guide or monitor treatment for MRSA infections. Performed at [facility], [address] [place], [place] [address]      Labs: Basic Metabolic Panel: Recent Labs  Lab 05/12/19 1423  NA 137  K 4.1  CL 102  CO2 23  GLUCOSE 105*  BUN 23  CREATININE 1.10  CALCIUM 9.1  MG 2.0   Liver Function Tests: Recent Labs  Lab 05/12/19 1423  AST 24  ALT 23  ALKPHOS 49  BILITOT 0.8    PROT 7.0  ALBUMIN 3.9   No results for input(s): LIPASE, AMYLASE in the last 168 hours. No results for input(s): AMMONIA in the last 168 hours. CBC: Recent Labs  Lab 05/12/19 1423  WBC 10.7*  NEUTROABS 8.1*  HGB 14.6  HCT 45.5  MCV 97.0  PLT 195   Cardiac Enzymes: No results for input(s): CKTOTAL, CKMB, CKMBINDEX, TROPONINI in the last 168 hours. BNP: Invalid input(s): POCBNP CBG: No results for input(s): GLUCAP in the last 168 hours.  Time coordinating discharge:  36 minutes  Signed:  Orson Eva, DO [facility] Pager: [phone] 05/14/2019, 11:06 AM

## 2019-05-14 NOTE — Progress Notes (Signed)
Pharmacy Brief Note: Apixaban education  Patient was educated about the use of apixaban on 05/14/19. Coupons and handouts  were given to patient. Patient exhibited excellent understanding. No barriers to learning found.  Thank You, Despina Pole, Pharm. D. Clinical Pharmacist 05/14/2019 11:05 AM

## 2019-05-16 DIAGNOSIS — I482 Chronic atrial fibrillation, unspecified: Secondary | ICD-10-CM | POA: Diagnosis not present

## 2019-05-16 DIAGNOSIS — E782 Mixed hyperlipidemia: Secondary | ICD-10-CM | POA: Diagnosis not present

## 2019-05-16 DIAGNOSIS — M25551 Pain in right hip: Secondary | ICD-10-CM | POA: Diagnosis not present

## 2019-05-16 DIAGNOSIS — I10 Essential (primary) hypertension: Secondary | ICD-10-CM | POA: Diagnosis not present

## 2019-05-16 DIAGNOSIS — Z136 Encounter for screening for cardiovascular disorders: Secondary | ICD-10-CM | POA: Diagnosis not present

## 2019-05-16 DIAGNOSIS — E1169 Type 2 diabetes mellitus with other specified complication: Secondary | ICD-10-CM | POA: Diagnosis not present

## 2019-05-16 DIAGNOSIS — E119 Type 2 diabetes mellitus without complications: Secondary | ICD-10-CM | POA: Diagnosis not present

## 2019-05-18 DIAGNOSIS — I4891 Unspecified atrial fibrillation: Secondary | ICD-10-CM | POA: Diagnosis not present

## 2019-05-18 DIAGNOSIS — M5416 Radiculopathy, lumbar region: Secondary | ICD-10-CM | POA: Diagnosis not present

## 2019-05-18 DIAGNOSIS — M4180 Other forms of scoliosis, site unspecified: Secondary | ICD-10-CM | POA: Diagnosis not present

## 2019-05-20 ENCOUNTER — Other Ambulatory Visit: Payer: Self-pay | Admitting: Neurological Surgery

## 2019-05-20 DIAGNOSIS — M5416 Radiculopathy, lumbar region: Secondary | ICD-10-CM

## 2019-05-23 ENCOUNTER — Other Ambulatory Visit: Payer: Self-pay

## 2019-05-23 ENCOUNTER — Ambulatory Visit (INDEPENDENT_AMBULATORY_CARE_PROVIDER_SITE_OTHER): Payer: Medicare Other | Admitting: Cardiovascular Disease

## 2019-05-23 ENCOUNTER — Encounter: Payer: Self-pay | Admitting: Cardiovascular Disease

## 2019-05-23 VITALS — BP 124/64 | HR 84 | Temp 98.4°F | Ht 68.0 in | Wt 205.0 lb

## 2019-05-23 DIAGNOSIS — E785 Hyperlipidemia, unspecified: Secondary | ICD-10-CM

## 2019-05-23 DIAGNOSIS — J449 Chronic obstructive pulmonary disease, unspecified: Secondary | ICD-10-CM

## 2019-05-23 DIAGNOSIS — I4891 Unspecified atrial fibrillation: Secondary | ICD-10-CM

## 2019-05-23 DIAGNOSIS — I10 Essential (primary) hypertension: Secondary | ICD-10-CM | POA: Diagnosis not present

## 2019-05-23 MED ORDER — DILTIAZEM HCL ER COATED BEADS 300 MG PO CP24
300.0000 mg | ORAL_CAPSULE | Freq: Every day | ORAL | 3 refills | Status: DC
Start: 2019-05-23 — End: 2020-05-09

## 2019-05-23 NOTE — Progress Notes (Signed)
SUBJECTIVE: The patient presents for posthospitalization follow-up for rapid atrial fibrillation.  I consulted on him on 05/13/2019.  He is here with his daughter, Verdis Frederickson, who would also like to be contacted with any information regarding her father's health [phone]). She plans to move here from [facility] to help take care of her father. He has another daughter who lives in [place] with her family.  That daughter has atrial fibrillation and recently underwent cardioversion.  He is feeling very well and denies chest pain, leg swelling, and palpitations.  Chronic exertional dyspnea from COPD is stable.  He denies bleeding problems with Eliquis.  He saw his PCP and his heart was elevated so Dr. Nevada Crane added metoprolol tartrate to his regimen.  He was already taking Cardizem CD 240 mg daily.  I personally reviewed the ECG performed today which demonstrates rapid atrial fibrillation, 108 bpm.  Stanton Kidney also had questions about a heart healthy diet.   Review of Systems: As per "subjective", otherwise negative.  No Known Allergies  Current Outpatient Medications  Medication Sig Dispense Refill  . acetaminophen (TYLENOL) 650 MG CR tablet Take 1,300 mg by mouth 2 (two) times daily as needed for pain.    Marland Kitchen apixaban (ELIQUIS) 5 MG TABS tablet Take 1 tablet (5 mg total) by mouth 2 (two) times daily. 60 tablet 1  . atorvastatin (LIPITOR) 20 MG tablet Take 1 tablet by mouth daily.    Marland Kitchen diltiazem (CARDIZEM CD) 240 MG 24 hr capsule Take 1 capsule (240 mg total) by mouth daily. 30 capsule 1  . methocarbamol (ROBAXIN) 500 MG tablet Take 1 tablet (500 mg total) by mouth every 8 (eight) hours as needed for muscle spasms. 15 tablet 0  . metoprolol tartrate (LOPRESSOR) 50 MG tablet Take 50 mg by mouth 2 (two) times daily.    . Omega-3 Fatty Acids (OMEGA 3 PO) Take 6 capsules by mouth daily.    Vladimir Faster Glycol-Propyl Glycol (SYSTANE) 0.4-0.3 % SOLN Place 1-2 drops into both eyes daily as  needed.    . predniSONE (DELTASONE) 10 MG tablet Take 6 tablets (60 mg total) by mouth daily with breakfast. And decrease by one tablet daily 21 tablet 0  . PROAIR HFA 108 (90 Base) MCG/ACT inhaler Inhale 2 puffs into the lungs every 6 (six) hours as needed.    . traMADol (ULTRAM) 50 MG tablet Take 1 tablet (50 mg total) by mouth every 6 (six) hours as needed for moderate pain. 12 tablet 0  . TRELEGY ELLIPTA 100-62.5-25 MCG/INH AEPB Inhale 1 puff into the lungs daily.     No current facility-administered medications for this visit.    Past Medical History:  Diagnosis Date  . Arthritis   . COPD (chronic obstructive pulmonary disease) ([place])   . Hyperchloremia   . Hypertension     Past Surgical History:  Procedure Laterality Date  . BACK SURGERY      Social History   Socioeconomic History  . Marital status: Married    Spouse name: Not on file  . Number of children: Not on file  . Years of education: Not on file  . Highest education level: Not on file  Occupational History  . Not on file  Tobacco Use  . Smoking status: Former Research scientist (life sciences)  . Smokeless tobacco: Never Used  Substance and Sexual Activity  . Alcohol use: Yes    Comment: occ  . Drug use: Never  . Sexual activity: Not on file  Other  Topics Concern  . Not on file  Social History Narrative  . Not on file   Social Determinants of Health   Financial Resource Strain:   . Difficulty of Paying Living Expenses:   Food Insecurity:   . Worried About Charity fundraiser in the Last Year:   . Arboriculturist in the Last Year:   Transportation Needs:   . Film/video editor (Medical):   Marland Kitchen Lack of Transportation (Non-Medical):   Physical Activity:   . Days of Exercise per Week:   . Minutes of Exercise per Session:   Stress:   . Feeling of Stress :   Social Connections:   . Frequency of Communication with Friends and Family:   . Frequency of Social Gatherings with Friends and Family:   . Attends Religious Services:    . Active Member of Clubs or Organizations:   . Attends Archivist Meetings:   Marland Kitchen Marital Status:   Intimate Partner Violence:   . Fear of Current or Ex-Partner:   . Emotionally Abused:   Marland Kitchen Physically Abused:   . Sexually Abused:      Vitals:   05/23/19 1540  BP: 124/64  Pulse: 84  Temp: 98.4 F (36.9 C)  SpO2: 94%  Weight: 205 lb (93 kg)  Height: 5\' 8"  (1.727 m)    Wt Readings from Last 3 Encounters:  05/23/19 205 lb (93 kg)  05/14/19 200 lb 6.4 oz (90.9 kg)     PHYSICAL EXAM General: NAD HEENT: Normal. Neck: No JVD, no thyromegaly. Lungs: Diffusely diminished breath sounds, no crackles or wheezes. CV: Distant heart tones.  Mildly tachycardic, irregular rhythm, normal S1/S2, no S3, no murmur. No pretibial or periankle edema.    Abdomen: Soft, nontender, no distention.  Neurologic: Alert and oriented.  Psych: Normal affect. Skin: Normal. Musculoskeletal: No gross deformities.      Labs: Lab Results  Component Value Date/Time   K 4.1 05/12/2019 02:23 PM   BUN 23 05/12/2019 02:23 PM   CREATININE 1.10 05/12/2019 02:23 PM   ALT 23 05/12/2019 02:23 PM   TSH 1.764 05/12/2019 02:23 PM   HGB 14.6 05/12/2019 02:23 PM     Lipids: No results found for: LDLCALC, LDLDIRECT, CHOL, TRIG, HDL     ASSESSMENT AND PLAN:  1.  Rapid atrial fibrillation: He saw his PCP and his heart was elevated so Dr. Nevada Crane added metoprolol tartrate to his regimen.  He was already taking Cardizem CD 240 mg daily.  He denies palpitations.  Heart rate is elevated at 108 bpm by ECG today.  Anticoagulated with Eliquis 5 mg twice daily.  He denies bleeding problems.  Mild left atrial dilatation seen by echocardiogram on 05/13/2019.  LV systolic function was normal, EF 60 to 65%.  I will increase Cardizem CD to 300 mg daily.  Continue metoprolol tartrate 50 mg twice daily.  2.  Hypertension: Blood pressure is normal.  No changes to therapy.  Dietary counseling was provided and a  low-sodium diet was strongly encouraged.  3.  Hyperlipidemia: Currently on atorvastatin 20 mg daily.  4.  COPD: Symptoms appear to be stable.  Managed by PCP.   Disposition: Follow up 3 months office visit   Kate Sable, M.D., F.A.C.C.

## 2019-05-23 NOTE — Patient Instructions (Addendum)
Medication Instructions: INCREASE Diltiazem to 300 mg daily   Labwork: None today  Procedures/Testing: None today  Follow-Up: 3 months office visit with Dr.Koneswaran  Any Additional Special Instructions Will Be Listed Below (If Applicable).     If you need a refill on your cardiac medications before your next appointment, please call your pharmacy.      Thank you for choosing [facility] !        DASH Eating Plan DASH stands for "Dietary Approaches to Stop Hypertension." The DASH eating plan is a healthy eating plan that has been shown to reduce high blood pressure (hypertension). It may also reduce your risk for type 2 diabetes, heart disease, and stroke. The DASH eating plan may also help with weight loss. What are tips for following this plan?  General guidelines  Avoid eating more than 2,300 mg (milligrams) of salt (sodium) a day. If you have hypertension, you may need to reduce your sodium intake to 1,500 mg a day.  Limit alcohol intake to no more than 1 drink a day for nonpregnant women and 2 drinks a day for men. One drink equals 12 oz of beer, 5 oz of wine, or 1 oz of hard liquor.  Work with your health care provider to maintain a healthy body weight or to lose weight. Ask what an ideal weight is for you.  Get at least 30 minutes of exercise that causes your heart to beat faster (aerobic exercise) most days of the week. Activities may include walking, swimming, or biking.  Work with your health care provider or diet and nutrition specialist (dietitian) to adjust your eating plan to your individual calorie needs. Reading food labels   Check food labels for the amount of sodium per serving. Choose foods with less than 5 percent of the Daily Value of sodium. Generally, foods with less than 300 mg of sodium per serving fit into this eating plan.  To find whole grains, look for the word "whole" as the first word in the ingredient  list. Shopping  Buy products labeled as "low-sodium" or "no salt added."  Buy fresh foods. Avoid canned foods and premade or frozen meals. Cooking  Avoid adding salt when cooking. Use salt-free seasonings or herbs instead of table salt or sea salt. Check with your health care provider or pharmacist before using salt substitutes.  Do not fry foods. Cook foods using healthy methods such as baking, boiling, grilling, and broiling instead.  Cook with heart-healthy oils, such as olive, canola, soybean, or sunflower oil. Meal planning  Eat a balanced diet that includes: ? 5 or more servings of fruits and vegetables each day. At each meal, try to fill half of your plate with fruits and vegetables. ? Up to 6-8 servings of whole grains each day. ? Less than 6 oz of lean meat, poultry, or fish each day. A 3-oz serving of meat is about the same size as a deck of cards. One egg equals 1 oz. ? 2 servings of low-fat dairy each day. ? A serving of nuts, seeds, or beans 5 times each week. ? Heart-healthy fats. Healthy fats called Omega-3 fatty acids are found in foods such as flaxseeds and coldwater fish, like sardines, salmon, and mackerel.  Limit how much you eat of the following: ? Canned or prepackaged foods. ? Food that is high in trans fat, such as fried foods. ? Food that is high in saturated fat, such as fatty meat. ? Sweets, desserts, sugary  drinks, and other foods with added sugar. ? Full-fat dairy products.  Do not salt foods before eating.  Try to eat at least 2 vegetarian meals each week.  Eat more home-cooked food and less restaurant, buffet, and fast food.  When eating at a restaurant, ask that your food be prepared with less salt or no salt, if possible. What foods are recommended? The items listed may not be a complete list. Talk with your dietitian about what dietary choices are best for you. Grains Whole-grain or whole-wheat bread. Whole-grain or whole-wheat pasta. Brown  rice. [place]ena Morrow. Bulgur. Whole-grain and low-sodium cereals. Pita bread. Low-fat, low-sodium crackers. Whole-wheat flour tortillas. Vegetables Fresh or frozen vegetables (raw, steamed, roasted, or grilled). Low-sodium or reduced-sodium tomato and vegetable juice. Low-sodium or reduced-sodium tomato sauce and tomato paste. Low-sodium or reduced-sodium canned vegetables. Fruits All fresh, dried, or frozen fruit. Canned fruit in natural juice (without added sugar). Meat and other protein foods Skinless chicken or [place]. Ground chicken or [place]. Pork with fat trimmed off. Fish and seafood. Egg whites. Dried beans, peas, or lentils. Unsalted nuts, nut butters, and seeds. Unsalted canned beans. Lean cuts of beef with fat trimmed off. Low-sodium, lean deli meat. Dairy Low-fat (1%) or fat-free (skim) milk. Fat-free, low-fat, or reduced-fat cheeses. Nonfat, low-sodium ricotta or cottage cheese. Low-fat or nonfat yogurt. Low-fat, low-sodium cheese. Fats and oils Soft margarine without trans fats. Vegetable oil. Low-fat, reduced-fat, or light mayonnaise and salad dressings (reduced-sodium). Canola, safflower, olive, soybean, and sunflower oils. Avocado. Seasoning and other foods Herbs. Spices. Seasoning mixes without salt. Unsalted popcorn and pretzels. Fat-free sweets. What foods are not recommended? The items listed may not be a complete list. Talk with your dietitian about what dietary choices are best for you. Grains Baked goods made with fat, such as croissants, muffins, or some breads. Dry pasta or rice meal packs. Vegetables Creamed or fried vegetables. Vegetables in a cheese sauce. Regular canned vegetables (not low-sodium or reduced-sodium). Regular canned tomato sauce and paste (not low-sodium or reduced-sodium). Regular tomato and vegetable juice (not low-sodium or reduced-sodium). Angie Fava. Olives. Fruits Canned fruit in a light or heavy syrup. Fried fruit. Fruit in cream or butter  sauce. Meat and other protein foods Fatty cuts of meat. Ribs. Fried meat. Berniece Salines. Sausage. Bologna and other processed lunch meats. Salami. Fatback. Hotdogs. Bratwurst. Salted nuts and seeds. Canned beans with added salt. Canned or smoked fish. Whole eggs or egg yolks. Chicken or [place] with skin. Dairy Whole or 2% milk, cream, and half-and-half. Whole or full-fat cream cheese. Whole-fat or sweetened yogurt. Full-fat cheese. Nondairy creamers. Whipped toppings. Processed cheese and cheese spreads. Fats and oils Butter. Stick margarine. Lard. Shortening. Ghee. Bacon fat. Tropical oils, such as coconut, palm kernel, or palm oil. Seasoning and other foods Salted popcorn and pretzels. Onion salt, garlic salt, seasoned salt, table salt, and sea salt. Worcestershire sauce. Tartar sauce. Barbecue sauce. Teriyaki sauce. Soy sauce, including reduced-sodium. Steak sauce. Canned and packaged gravies. Fish sauce. Oyster sauce. Cocktail sauce. Horseradish that you find on the shelf. Ketchup. Mustard. Meat flavorings and tenderizers. Bouillon cubes. Hot sauce and Tabasco sauce. Premade or packaged marinades. Premade or packaged taco seasonings. Relishes. Regular salad dressings. Where to find more information:  National Heart, Lung, and [place]: https://wilson-eaton.com/  American Heart Association: www.heart.org Summary  The DASH eating plan is a healthy eating plan that has been shown to reduce high blood pressure (hypertension). It may also reduce your risk for type 2 diabetes, heart disease, and  stroke.  With the DASH eating plan, you should limit salt (sodium) intake to 2,300 mg a day. If you have hypertension, you may need to reduce your sodium intake to 1,500 mg a day.  When on the DASH eating plan, aim to eat more fresh fruits and vegetables, whole grains, lean proteins, low-fat dairy, and heart-healthy fats.  Work with your health care provider or diet and nutrition specialist (dietitian) to adjust  your eating plan to your individual calorie needs. This information is not intended to replace advice given to you by your health care provider. Make sure you discuss any questions you have with your health care provider. Document Revised: 12/19/2016 Document Reviewed: 12/31/2015 Elsevier Patient Education  2020 [employer].

## 2019-05-27 ENCOUNTER — Ambulatory Visit: Payer: Medicare Other | Admitting: Student

## 2019-06-01 IMAGING — MR MR ABDOMEN WO/W CM
11 of 19 series · 27 of 48 positions shown · IV contrast (18ml Multihance)
Comparison: 10/20/2017 CT abdomen.

CLINICAL DATA: Indeterminate right adrenal nodule on recent CT.

EXAM:
MRI ABDOMEN WITHOUT AND WITH CONTRAST
TECHNIQUE: Multiplanar multisequence MR imaging of the abdomen was performed
both before and after the administration of intravenous contrast.
CONTRAST:  18mL MULTIHANCE GADOBENATE DIMEGLUMINE 529 MG/ML IV SOLN

[Series 3: T2 · coronal · 5.0mm · 1.56mm/px · 2 of 30 slices shown (1 of 3)]
[im 1/30]
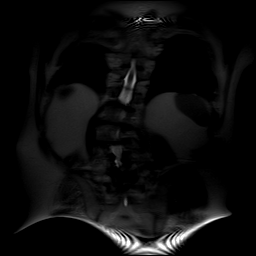
[im 30/30]
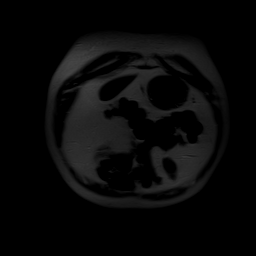

[Series 4: T2 · axial · 5.0mm · 1.37mm/px · z∈[-85,+136]mm · 2 of 35 slices shown (2 of 3)]
[im 1/35]
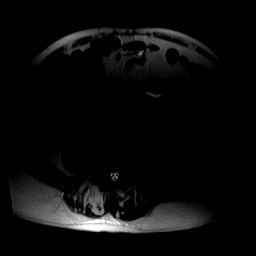
[im 35/35]
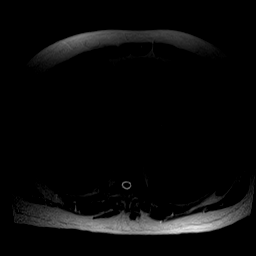

[Series 5: [id number] · axial · 6.0mm · 1.98mm/px · z∈[-71,+155]mm · 5 of 90 slices shown]
[im 1/90]
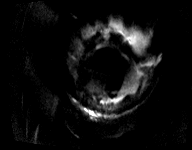
[im 23/90]
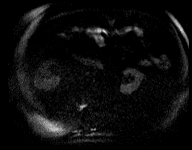
[im 45/90]
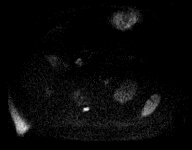
[im 67/90]
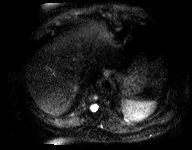
[im 90/90]
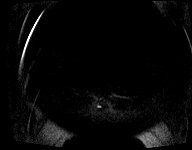

[Series 6: [id number]_adc · axial · 6.0mm · 1.98mm/px · 1 of 30 slices shown]
[im 1/30]
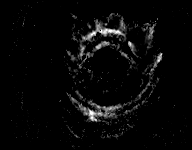

[Series 7: T2 · axial · 6.5mm · 0.74mm/px · 1 of 30 slices shown (3 of 3)]
[im 1/30]
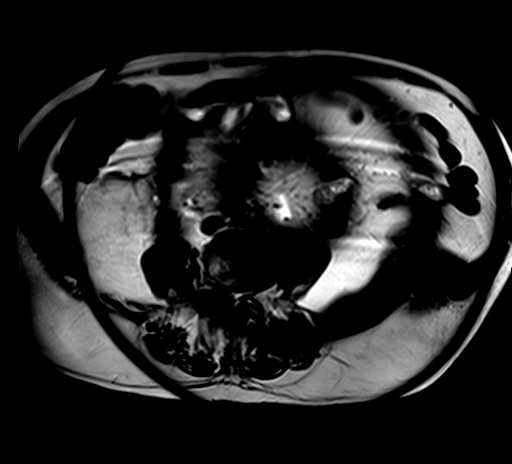

[Series 8: cor in out · coronal · 5.5mm · 0.74mm/px · 2 of 54 slices shown]
[im 1/54]
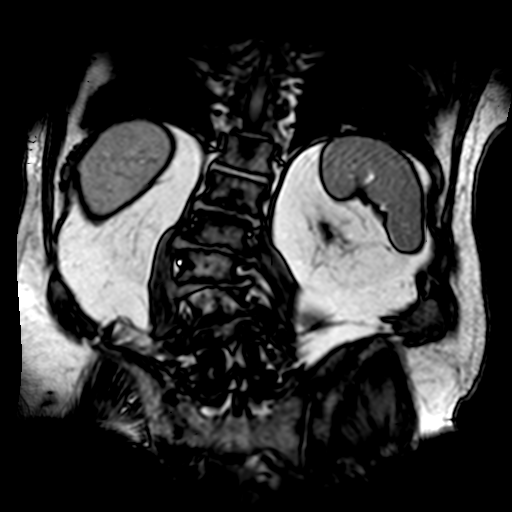
[im 54/54]
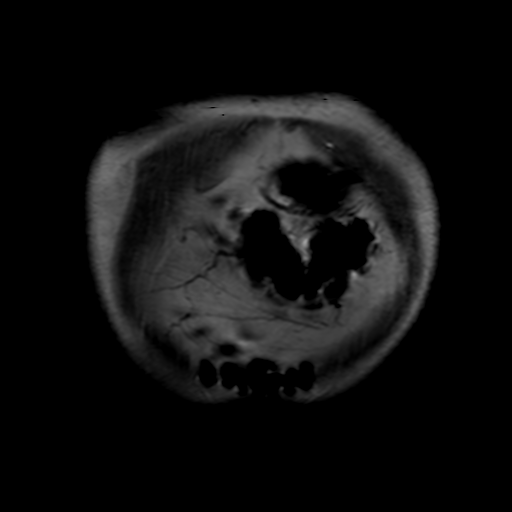

[Series 9: axial in out · axial · 6.0mm · 0.74mm/px · z∈[-81,+119]mm · 2 of 60 slices shown]
[im 1/60]
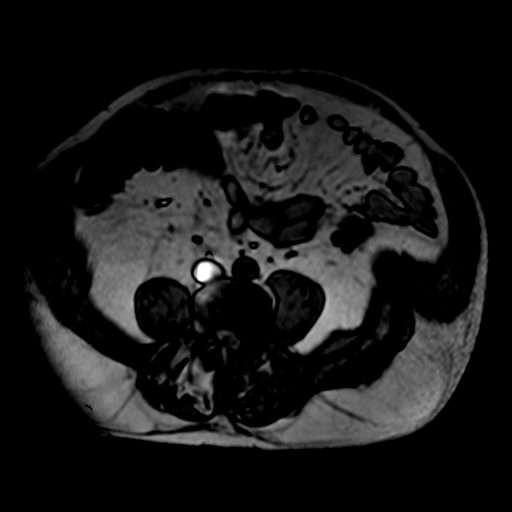
[im 60/60]
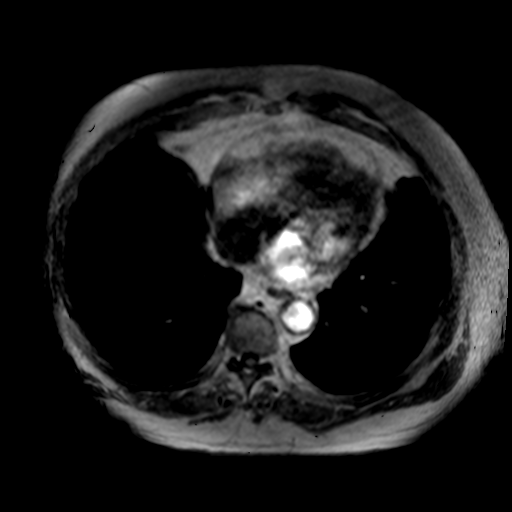

[Series 12: T1 dynamic · axial · non-contrast · 2.2mm · 0.78mm/px · z∈[-57,+117]mm · 3 of 80 slices shown]
[im 1/80]
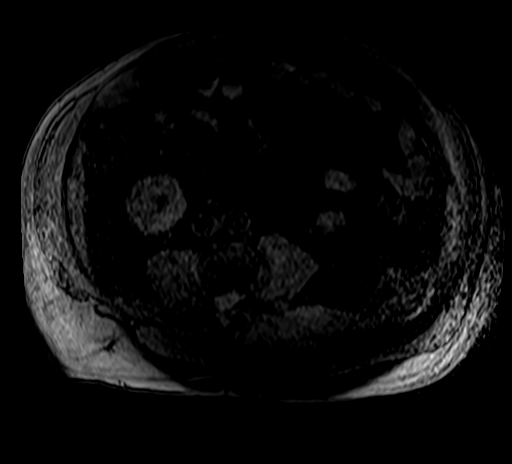
[im 40/80]
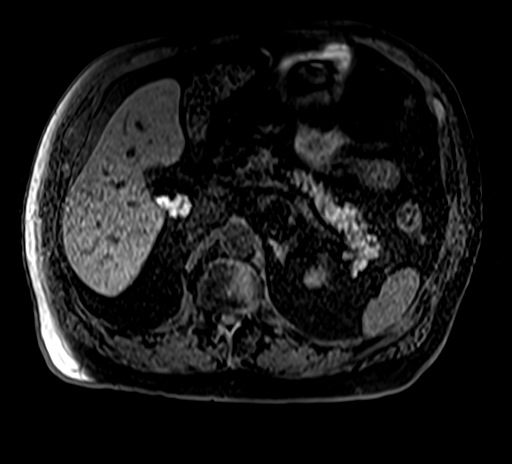
[im 80/80]
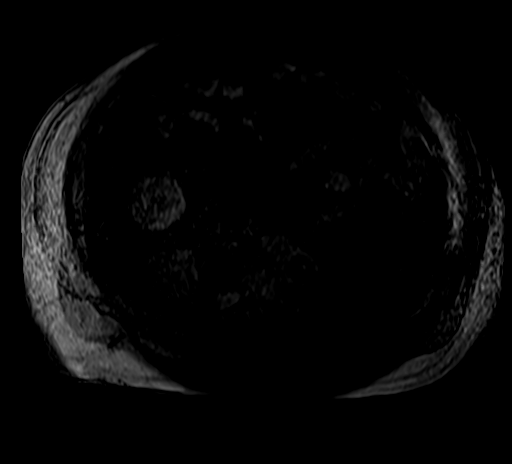

[Series 13: post 25 sec · axial · 2.2mm · 0.78mm/px · z∈[-57,+117]mm · 3 of 80 slices shown]
[im 1/80]
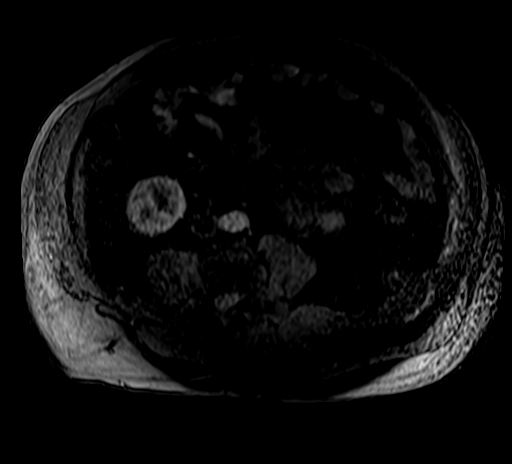
[im 40/80]
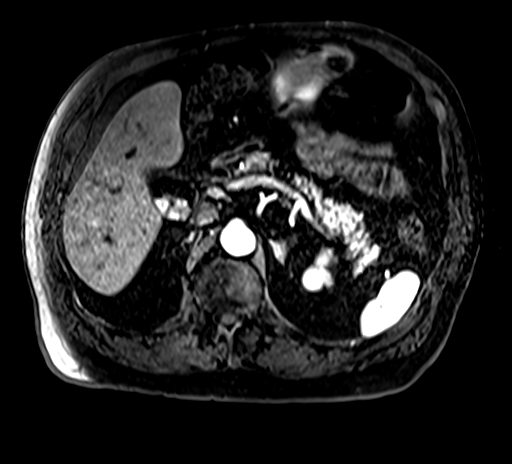
[im 80/80]
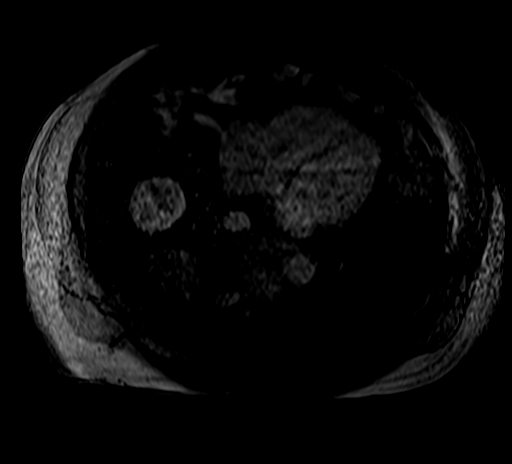

[Series 14: post 25 sec_sub · axial · 2.2mm · 0.78mm/px · z∈[-57,+117]mm · 3 of 80 slices shown]
[im 1/80]
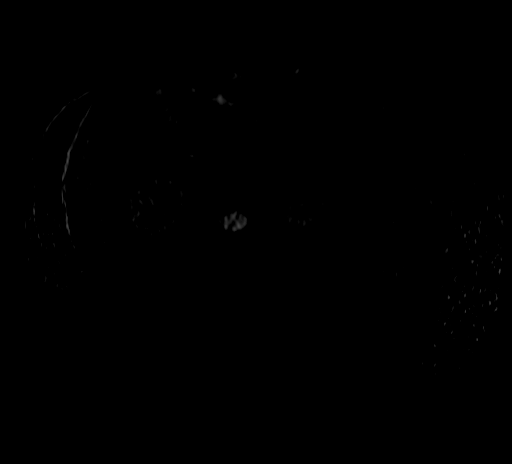
[im 40/80]
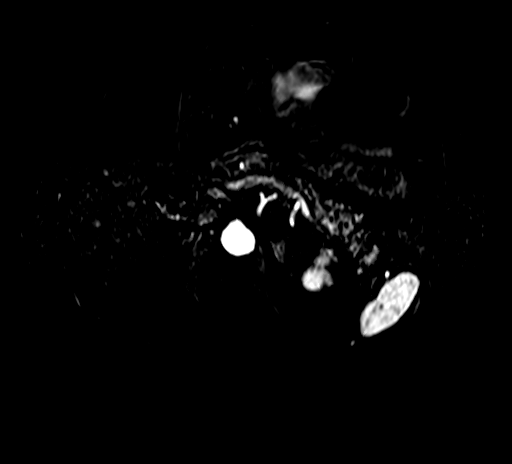
[im 80/80]
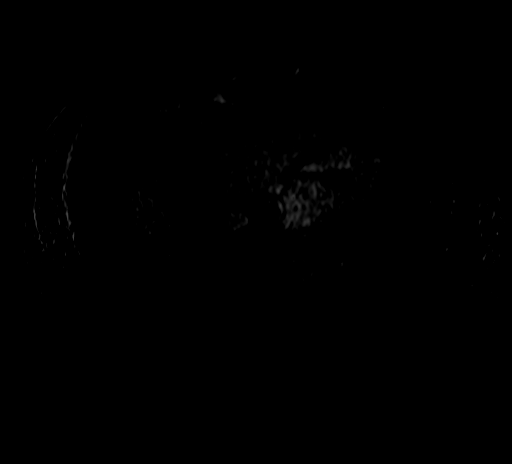

[Series 15: post 45 sec · axial · 2.2mm · 0.78mm/px · z∈[-57,+117]mm · 3 of 80 slices shown]
[im 1/80]
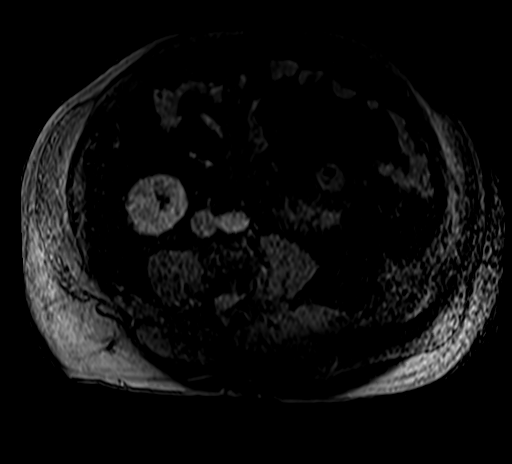
[im 40/80]
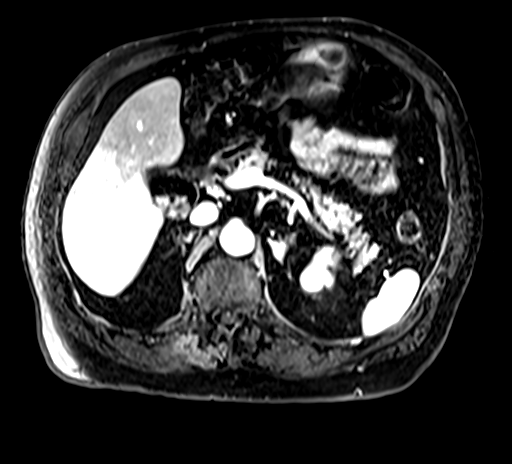
[im 80/80]
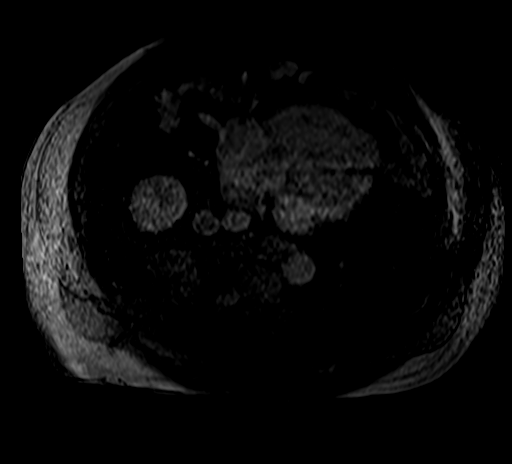

[27 of 48 positions shown; findings below may reference images not displayed]

FINDINGS: Lower chest: Mild hypoventilatory changes at the dependent lung
bases.

Hepatobiliary: Normal liver size and configuration. No hepatic
steatosis. No liver mass. Gallbladder contains multiple layering
gallstones up to 1.7 cm in size. No gallbladder wall thickening. No
significant gallbladder distention. No pericholecystic fluid. No
significant intrahepatic biliary ductal dilatation. Mildly dilated
common bile duct (9 mm diameter proximally) with smooth distal
tapering. No convincing filling defects within bile ducts. No
biliary strictures or masses.

Pancreas: No pancreatic mass or duct dilation.  No pancreas divisum.

Spleen: Normal size. No mass.

Adrenals/Urinary Tract: Normal left adrenal gland. Right adrenal
cm nodule (series 13/image 27) demonstrates mild loss of signal
intensity on chemical shift imaging (14%). Inferior right adrenal
1.1 cm nodule (series 13/image 30) demonstrates prominent loss of
signal intensity on chemical shift imaging, compatible with adrenal
adenoma. No hydronephrosis. Normal kidneys with no renal mass.

Stomach/Bowel: Normal non-distended stomach. Visualized small and
large bowel is normal caliber, with no bowel wall thickening.

Vascular/Lymphatic: Atherosclerotic nonaneurysmal abdominal aorta.
Patent portal, splenic, hepatic and renal veins. No pathologically
enlarged lymph nodes in the abdomen.

Other: No abdominal ascites or focal fluid collection.

Musculoskeletal: No aggressive appearing focal osseous lesions. Long
segment moderate dextrocurvature of the thoracolumbar spine.
IMPRESSION: 1. Inferior right adrenal 1.1 cm nodule is characterized as a benign
adenoma by MRI.
2. Superior right adrenal 1.5 cm nodule is probably a lipid poor
adenoma. Follow-up adrenal protocol CT abdomen without and with IV
contrast recommended in 12 months. This recommendation follows ACR
consensus guidelines: Management of Incidental Adrenal Masses: A
White Paper of the ACR Incidental Findings Committee. [HOSPITAL] 7890;[id number].
3. Cholelithiasis.  No MRI findings of acute cholecystitis.
4. Mildly dilated common bile duct (9 mm diameter proximally) with
smooth distal tapering. No significant intrahepatic biliary ductal
dilatation. No evidence of choledocholithiasis.

## 2019-06-18 ENCOUNTER — Other Ambulatory Visit: Payer: Medicare Other

## 2019-07-04 DIAGNOSIS — E1169 Type 2 diabetes mellitus with other specified complication: Secondary | ICD-10-CM | POA: Diagnosis not present

## 2019-07-04 DIAGNOSIS — E119 Type 2 diabetes mellitus without complications: Secondary | ICD-10-CM | POA: Diagnosis not present

## 2019-07-04 DIAGNOSIS — E782 Mixed hyperlipidemia: Secondary | ICD-10-CM | POA: Diagnosis not present

## 2019-07-11 DIAGNOSIS — E1169 Type 2 diabetes mellitus with other specified complication: Secondary | ICD-10-CM | POA: Diagnosis not present

## 2019-07-11 DIAGNOSIS — J449 Chronic obstructive pulmonary disease, unspecified: Secondary | ICD-10-CM | POA: Diagnosis not present

## 2019-07-11 DIAGNOSIS — E782 Mixed hyperlipidemia: Secondary | ICD-10-CM | POA: Diagnosis not present

## 2019-07-11 DIAGNOSIS — R945 Abnormal results of liver function studies: Secondary | ICD-10-CM | POA: Diagnosis not present

## 2019-07-11 DIAGNOSIS — I10 Essential (primary) hypertension: Secondary | ICD-10-CM | POA: Diagnosis not present

## 2019-07-13 ENCOUNTER — Other Ambulatory Visit: Payer: Self-pay

## 2019-07-13 ENCOUNTER — Encounter: Payer: Self-pay | Admitting: Urology

## 2019-07-13 ENCOUNTER — Ambulatory Visit (INDEPENDENT_AMBULATORY_CARE_PROVIDER_SITE_OTHER): Payer: Medicare Other | Admitting: Urology

## 2019-07-13 VITALS — BP 109/52 | HR 56 | Temp 96.8°F | Ht 68.0 in | Wt 195.0 lb

## 2019-07-13 DIAGNOSIS — R972 Elevated prostate specific antigen [PSA]: Secondary | ICD-10-CM

## 2019-07-13 MED ORDER — LEVOFLOXACIN 750 MG PO TABS
750.0000 mg | ORAL_TABLET | Freq: Once | ORAL | 0 refills | Status: AC
Start: 2019-07-13 — End: 2019-07-14

## 2019-07-13 NOTE — Progress Notes (Signed)
Urological Symptom Review  Patient is experiencing the following symptoms: Hard to postpone urination Get up at night to urinate Weak stream   Review of Systems  Gastrointestinal (upper)  : Indigestion/heartburn  Gastrointestinal (lower) : Diarrhea Constipation  Constitutional : Night Sweats  Skin: None  Eyes: Blurred vision Double vision Negative for eye symptoms  Ear/Nose/Throat : Negative for Ear/Nose/Throat symptoms  Hematologic/Lymphatic: Easy bruising  Cardiovascular : Leg swelling  Respiratory : Shortness of breath  Endocrine: Excessive thirst  Musculoskeletal: Back pain Joint pain  Neurological: Negative for neurological symptoms  Psychologic: Depression Anxiety

## 2019-07-13 NOTE — H&P (View-Only) (Signed)
07/13/2019 9:06 AM   Bryan Hamilton May 01, 1945 [id number]  Referring provider: Celene Squibb, MD [place],  [place] [address]  Elevated PSA  HPI: Mr Hair is a 74yo here for evaluation of elevated PSA. PSA was 16.9 last week. 3 months ago PSA was 10. He has worsening pain in his right shoulder and right leg. Patient denies any LUTS. No previous prostate biopsy. He has afib and is on eliquis. He has anxiety and is on xanax.     PMH: Past Medical History:  Diagnosis Date  . Anxiety   . Arrhythmia   . Arthritis   . Asthma   . Atrial fibrillation ([place])   . COPD (chronic obstructive pulmonary disease) ([place])   . Depression   . Diabetes mellitus without complication ([place])   . GERD (gastroesophageal reflux disease)   . Hyperchloremia   . Hypertension     Surgical History: Past Surgical History:  Procedure Laterality Date  . BACK SURGERY      Home Medications:  Allergies as of 07/13/2019   No Known Allergies     Medication List       Accurate as of July 13, 2019  9:06 AM. If you have any questions, ask your nurse or doctor.        acetaminophen 650 MG CR tablet Commonly known as: TYLENOL Take 1,300 mg by mouth 2 (two) times daily as needed for pain.   ALPRAZolam 1 MG tablet Commonly known as: XANAX SMARTSIG:1 Tablet(s) By Mouth   ALPRAZolam 0.25 MG tablet Commonly known as: XANAX Take 0.25 mg by mouth daily as needed.   apixaban 5 MG Tabs tablet Commonly known as: ELIQUIS Take 1 tablet (5 mg total) by mouth 2 (two) times daily.   atorvastatin 20 MG tablet Commonly known as: LIPITOR Take 1 tablet by mouth daily.   diltiazem 300 MG 24 hr capsule Commonly known as: Cardizem CD Take 1 capsule (300 mg total) by mouth daily.   methocarbamol 500 MG tablet Commonly known as: ROBAXIN Take 1 tablet (500 mg total) by mouth every 8 (eight) hours as needed for muscle spasms.   metoprolol tartrate 50 MG tablet Commonly known as:  LOPRESSOR Take 50 mg by mouth 2 (two) times daily.   OMEGA 3 PO Take 6 capsules by mouth daily.   predniSONE 10 MG tablet Commonly known as: DELTASONE Take 6 tablets (60 mg total) by mouth daily with breakfast. And decrease by one tablet daily   ProAir HFA 108 (90 Base) MCG/ACT inhaler Generic drug: albuterol Inhale 2 puffs into the lungs every 6 (six) hours as needed.   sertraline 25 MG tablet Commonly known as: ZOLOFT Take 25 mg by mouth daily.   Systane 0.4-0.3 % Soln Generic drug: Polyethyl Glycol-Propyl Glycol Place 1-2 drops into both eyes daily as needed.   traMADol 50 MG tablet Commonly known as: ULTRAM Take 1 tablet (50 mg total) by mouth every 6 (six) hours as needed for moderate pain.   Trelegy Ellipta 100-62.5-25 MCG/INH Aepb Generic drug: Fluticasone-Umeclidin-Vilant Inhale 1 puff into the lungs daily.       Allergies: No Known Allergies  Family History: Family History  Problem Relation Age of Onset  . Cancer Father     Social History:  reports that he has quit smoking. He has never used smokeless tobacco. He reports current alcohol use. He reports that he does not use drugs.  ROS: All other review of systems were reviewed and are  negative except what is noted above in HPI  Physical Exam: BP (!) 109/52   Pulse (!) 56   Temp (!) 96.8 F (36 C)   Ht 5\' 8"  (1.727 m)   Wt 195 lb (88.5 kg)   BMI 29.65 kg/m   Constitutional:  Alert and oriented, No acute distress. HEENT: [place] AT, moist mucus membranes.  Trachea midline, no masses. Cardiovascular: No clubbing, cyanosis, or edema. Respiratory: Normal respiratory effort, no increased work of breathing. GI: Abdomen is soft, nontender, nondistended, no abdominal masses GU: No CVA tenderness.   Lymph: No cervical or inguinal lymphadenopathy. Skin: No rashes, bruises or suspicious lesions. Neurologic: Grossly intact, no focal deficits, moving all 4 extremities. Psychiatric: Normal mood and  affect.  Laboratory Data: Lab Results  Component Value Date   WBC 10.7 (H) 05/12/2019   HGB 14.6 05/12/2019   HCT 45.5 05/12/2019   MCV 97.0 05/12/2019   PLT 195 05/12/2019    Lab Results  Component Value Date   CREATININE 1.10 05/12/2019    No results found for: PSA  No results found for: TESTOSTERONE  No results found for: HGBA1C  Urinalysis No results found for: COLORURINE, APPEARANCEUR, LABSPEC, PHURINE, GLUCOSEU, HGBUR, BILIRUBINUR, KETONESUR, PROTEINUR, UROBILINOGEN, NITRITE, LEUKOCYTESUR  No results found for: LABMICR, [place], RBCUA, LABEPIT, MUCUS, BACTERIA  Pertinent Imaging:  No results found for this or any previous visit.  No results found for this or any previous visit.  No results found for this or any previous visit.  No results found for this or any previous visit.  No results found for this or any previous visit.  No results found for this or any previous visit.  No results found for this or any previous visit.  No results found for this or any previous visit.   Assessment & Plan:    1. Elevated PSA -We discussed prostate biopsy and the patient wishes to proceed.    No follow-ups on file.  Nicolette Bang, MD  [facility] [place]

## 2019-07-13 NOTE — Progress Notes (Signed)
Surgical clearance faxed to Dr. Nevada Crane office for pt to hold Eliquis per Dr. Alyson Ingles for prostate biopsy. Approval pending.

## 2019-07-13 NOTE — Progress Notes (Signed)
07/13/2019 9:06 AM   Bryan Hamilton Apr 20, 1945 [id number]  Referring provider: Celene Squibb, MD [place],  [place] [address]  Elevated PSA  HPI: Bryan Hamilton is a 74yo here for evaluation of elevated PSA. PSA was 16.9 last week. 3 months ago PSA was 10. He has worsening pain in his right shoulder and right leg. Patient denies any LUTS. No previous prostate biopsy. He has afib and is on eliquis. He has anxiety and is on xanax.     PMH: Past Medical History:  Diagnosis Date  . Anxiety   . Arrhythmia   . Arthritis   . Asthma   . Atrial fibrillation (Bryan Hamilton)   . COPD (chronic obstructive pulmonary disease) ([place])   . Depression   . Diabetes mellitus without complication ([place])   . GERD (gastroesophageal reflux disease)   . Hyperchloremia   . Hypertension     Surgical History: Past Surgical History:  Procedure Laterality Date  . BACK SURGERY      Home Medications:  Allergies as of 07/13/2019   No Known Allergies     Medication List       Accurate as of July 13, 2019  9:06 AM. If you have any questions, ask your nurse or doctor.        acetaminophen 650 MG CR tablet Commonly known as: TYLENOL Take 1,300 mg by mouth 2 (two) times daily as needed for pain.   ALPRAZolam 1 MG tablet Commonly known as: XANAX SMARTSIG:1 Tablet(s) By Mouth   ALPRAZolam 0.25 MG tablet Commonly known as: XANAX Take 0.25 mg by mouth daily as needed.   apixaban 5 MG Tabs tablet Commonly known as: ELIQUIS Take 1 tablet (5 mg total) by mouth 2 (two) times daily.   atorvastatin 20 MG tablet Commonly known as: LIPITOR Take 1 tablet by mouth daily.   diltiazem 300 MG 24 hr capsule Commonly known as: Cardizem CD Take 1 capsule (300 mg total) by mouth daily.   methocarbamol 500 MG tablet Commonly known as: ROBAXIN Take 1 tablet (500 mg total) by mouth every 8 (eight) hours as needed for muscle spasms.   metoprolol tartrate 50 MG tablet Commonly known as:  LOPRESSOR Take 50 mg by mouth 2 (two) times daily.   OMEGA 3 PO Take 6 capsules by mouth daily.   predniSONE 10 MG tablet Commonly known as: DELTASONE Take 6 tablets (60 mg total) by mouth daily with breakfast. And decrease by one tablet daily   ProAir HFA 108 (90 Base) MCG/ACT inhaler Generic drug: albuterol Inhale 2 puffs into the lungs every 6 (six) hours as needed.   sertraline 25 MG tablet Commonly known as: ZOLOFT Take 25 mg by mouth daily.   Systane 0.4-0.3 % Soln Generic drug: Polyethyl Glycol-Propyl Glycol Place 1-2 drops into both eyes daily as needed.   traMADol 50 MG tablet Commonly known as: ULTRAM Take 1 tablet (50 mg total) by mouth every 6 (six) hours as needed for moderate pain.   Trelegy Ellipta 100-62.5-25 MCG/INH Aepb Generic drug: Fluticasone-Umeclidin-Vilant Inhale 1 puff into the lungs daily.       Allergies: No Known Allergies  Family History: Family History  Problem Relation Age of Onset  . Cancer Father     Social History:  reports that he has quit smoking. He has never used smokeless tobacco. He reports current alcohol use. He reports that he does not use drugs.  ROS: All other review of systems were reviewed and are  negative except what is noted above in HPI  Physical Exam: BP (!) 109/52   Pulse (!) 56   Temp (!) 96.8 F (36 C)   Ht 5\' 8"  (1.727 m)   Wt 195 lb (88.5 kg)   BMI 29.65 kg/m   Constitutional:  Alert and oriented, No acute distress. HEENT: [place] AT, moist mucus membranes.  Trachea midline, no masses. Cardiovascular: No clubbing, cyanosis, or edema. Respiratory: Normal respiratory effort, no increased work of breathing. GI: Abdomen is soft, nontender, nondistended, no abdominal masses GU: No CVA tenderness.   Lymph: No cervical or inguinal lymphadenopathy. Skin: No rashes, bruises or suspicious lesions. Neurologic: Grossly intact, no focal deficits, moving all 4 extremities. Psychiatric: Normal mood and  affect.  Laboratory Data: Lab Results  Component Value Date   WBC 10.7 (H) 05/12/2019   HGB 14.6 05/12/2019   HCT 45.5 05/12/2019   MCV 97.0 05/12/2019   PLT 195 05/12/2019    Lab Results  Component Value Date   CREATININE 1.10 05/12/2019    No results found for: PSA  No results found for: TESTOSTERONE  No results found for: HGBA1C  Urinalysis No results found for: COLORURINE, APPEARANCEUR, LABSPEC, PHURINE, GLUCOSEU, HGBUR, BILIRUBINUR, KETONESUR, PROTEINUR, UROBILINOGEN, NITRITE, LEUKOCYTESUR  No results found for: LABMICR, [place], RBCUA, LABEPIT, MUCUS, BACTERIA  Pertinent Imaging:  No results found for this or any previous visit.  No results found for this or any previous visit.  No results found for this or any previous visit.  No results found for this or any previous visit.  No results found for this or any previous visit.  No results found for this or any previous visit.  No results found for this or any previous visit.  No results found for this or any previous visit.   Assessment & Plan:    1. Elevated PSA -We discussed prostate biopsy and the patient wishes to proceed.    No follow-ups on file.  Nicolette Bang, MD  [facility] [place]

## 2019-07-13 NOTE — Patient Instructions (Addendum)
Patient Name: Bryan Hamilton Appointment Time TBD Appointment Date:TBD    Prostate Biopsy Instructions  Stop all aspirin or blood thinners (aspirin, plavix, coumadin, warfarin, motrin, ibuprofen, advil, aleve, naproxen, naprosyn) for 7 days prior to the procedure.  If you have any questions about stopping these medications, please contact your primary care physician or cardiologist.  Having a light meal prior to the procedure is recommended.  If you are diabetic or have low blood sugar please bring a small snack or glucose tablet.  A Fleets enema is needed to be purchased over the counter at a local pharmacy and used 2 hours before you scheduled appointment.  This can be purchased over the counter at any pharmacy.  Antibiotics will be administered in the clinic at the time of the procedure and 1 tablet has been sent to your pharmacy. Please take the antibiotic as prescribed.    Please bring someone with you to the procedure to drive you home.   If you have any questions or concerns, please feel free to call the office at [phone] or send a Mychart message.    Thank you, [facility]    Transrectal Ultrasound-Guided Prostate Biopsy This is a procedure to take samples of tissue from your prostate. Ultrasound images are used to guide the procedure. It is usually done to check for prostate cancer. What happens before the procedure? Staying hydrated Follow instructions about liquids. These may include:  Up to 2 hours before the procedure - you may drink clear liquids. This includes water, clear fruit juice, black coffee, and plain tea.  Eating and drinking restrictions Follow instructions about eating and drinking. These may include:  8 hours before the procedure - stop eating heavy meals or foods. This includes meat, fried foods, or fatty foods.  6 hours before the procedure - stop eating light meals or foods. This includes toast or cereal.  6 hours before the  procedure - stop drinking milk or drinks that have milk.  2 hours before the procedure - stop drinking clear liquids. Medicines Ask your doctor about:  Changing or stopping your normal medicines. This is important if you take diabetes medicines or blood thinners.  Taking over-the-counter medicines, vitamins, herbs, and supplements.  Taking medicines such as aspirin and ibuprofen. These medicines can thin your blood. Do not take these medicines unless your doctor tells you to take them. General instructions  You may be given antibiotic medicine. If so, take it as told by your doctor.  Liquid will be used to clear waste from your butt (enema).  You may have a blood sample taken.  You may have a pee (urine) sample taken.  Plan to have someone take you home after your procedure. What happens during the procedure?   To lower your risk of infection: ? Your health care team will wash or sanitize their hands. ? Hair may be removed from the area. ? Your skin will be cleaned with soap. ? You will be given antibiotics.  An IV will be placed into one of your veins.  You will be given one or both of these: ? A medicine to help you relax. ? A medicine to numb the area.  You will lie on your left side. Your knees will be bent.  A probe with gel on it will be placed in your butt. Pictures will be taken of your prostate and the area around it.  Medicine will be used to numb your prostate.  A needle  will be placed in your butt and moved to your prostate.  Prostate tissue will be removed.  The samples will be sent to a lab. The procedure may vary. What happens after the procedure?  You will be watched until the medicines you were given have worn off.  You may have some pain in your butt. You will be given medicine for it. Summary  This procedure is usually done to check for prostate cancer.  Before the procedure, ask your doctor about changing or stopping your medicines.  You  may have some pain in your butt. You will be given medicine for it.  Plan to have someone take you home after the procedure. This information is not intended to replace advice given to you by your health care provider. Make sure you discuss any questions you have with your health care provider. Document Revised: 04/28/2018 Document Reviewed: 04/04/2016 Elsevier Patient Education  [place].

## 2019-07-15 ENCOUNTER — Telehealth: Payer: Self-pay

## 2019-07-15 ENCOUNTER — Other Ambulatory Visit: Payer: Self-pay

## 2019-07-15 NOTE — Patient Instructions (Addendum)
Your procedure is scheduled on: 07/20/2019  Report to [facility] at  11:00   AM.  Call this number if you have problems the morning of surgery: [phone]   Remember:   Do not Eat or Drink after midnight         No Smoking the morning of surgery  :  Take these medicines the morning of surgery with A SIP OF WATER: Diltiazem, metoprolol, and zoloft                         Tramadolol, xanax and use inhalers if needed              Hold Eliquis as instructed by Dr Alyson Ingles               Do not wear jewelry, make-up or nail polish.  Do not wear lotions, powders, or perfumes. You may wear deodorant.  Do not shave 48 hours prior to surgery. Men may shave face and neck.  Do not bring valuables to the hospital.  Contacts, dentures or bridgework may not be worn into surgery.  Leave suitcase in the car. After surgery it may be brought to your room.  For patients admitted to the hospital, checkout time is 11:00 AM the day of discharge.   Patients discharged the day of surgery will not be allowed to drive home.    Special Instructions: Shower using CHG night before surgery and shower the day of surgery use CHG.  Use special wash - you have one bottle of CHG for all showers.  You should use approximately 1/2 of the bottle for each shower.  Transrectal Ultrasound-Guided Prostate Biopsy, Care After This sheet gives you information about how to care for yourself after your procedure. Your doctor may also give you more specific instructions. If you have problems or questions, contact your doctor. What can I expect after the procedure? After the procedure, it is common to have:  Pain and discomfort in your butt, especially while sitting.  Pink-colored pee (urine), due to small amounts of blood in the pee.  Burning while peeing (urinating).  Blood in your poop (stool).  Bleeding from your butt.  Blood in your semen. Follow these instructions at home: Medicines  Take over-the-counter and  prescription medicines only as told by your doctor.  If you were prescribed antibiotic medicine, take it as told by your doctor. Do not stop taking the antibiotic even if you start to feel better. Activity   Do not drive for 24 hours if you were given a medicine to help you relax (sedative) during your procedure.  Return to your normal activities as told by your doctor. Ask your doctor what activities are safe for you.  Ask your doctor when it is okay for you to have sex.  Do not lift anything that is heavier than 10 lb (4.5 kg), or the limit that you are told, until your doctor says that it is safe. General instructions   Drink enough water to keep your pee pale yellow.  Watch your pee, poop, and semen for new bleeding or bleeding that gets worse.  Keep all follow-up visits as told by your doctor. This is important. Contact a doctor if you:  Have blood clots in your pee or poop.  Notice that your pee smells bad or unusual.  Have very bad belly pain.  Have trouble peeing.  Notice that your lower belly feels firm.  Have blood in  your pee for more than 2 weeks after the procedure.  Have blood in your semen for more than 2 months after the procedure.  Have problems getting an erection.  Feel sick to your stomach (nauseous).  Throw up (vomit).  Have new or worse bleeding in your pee, poop, or semen. Get help right away if you:  Have a fever or chills.  Have bright red pee.  Have very bad pain that does not get better with medicine.  Cannot pee. Summary  After this procedure, it is common to have pain and discomfort around your butt, especially while sitting.  You may have blood in your pee and poop.  It is common to have blood in your semen for 1-2 months.  If you were prescribed antibiotic medicine, take it as told by your doctor. Do not stop taking the antibiotic even if you start to feel better.  Get help right away if you have a fever or chills. This  information is not intended to replace advice given to you by your health care provider. Make sure you discuss any questions you have with your health care provider. Document Revised: 04/28/2018 Document Reviewed: 11/04/2016 Elsevier Patient Education  [place] Anesthesia, Adult, Care After This sheet gives you information about how to care for yourself after your procedure. Your health care provider may also give you more specific instructions. If you have problems or questions, contact your health care provider. What can I expect after the procedure? After the procedure, the following side effects are common:  Pain or discomfort at the IV site.  Nausea.  Vomiting.  Sore throat.  Trouble concentrating.  Feeling cold or chills.  Weak or tired.  Sleepiness and fatigue.  Soreness and body aches. These side effects can affect parts of the body that were not involved in surgery. Follow these instructions at home:  For at least 24 hours after the procedure:  Have a responsible adult stay with you. It is important to have someone help care for you until you are awake and alert.  Rest as needed.  Do not: ? Participate in activities in which you could fall or become injured. ? Drive. ? Use heavy machinery. ? Drink alcohol. ? Take sleeping pills or medicines that cause drowsiness. ? Make important decisions or sign legal documents. ? Take care of children on your own. Eating and drinking  Follow any instructions from your health care provider about eating or drinking restrictions.  When you feel hungry, start by eating small amounts of foods that are soft and easy to digest (bland), such as toast. Gradually return to your regular diet.  Drink enough fluid to keep your urine pale yellow.  If you vomit, rehydrate by drinking water, juice, or clear broth. General instructions  If you have sleep apnea, surgery and certain medicines can increase your risk  for breathing problems. Follow instructions from your health care provider about wearing your sleep device: ? Anytime you are sleeping, including during daytime naps. ? While taking prescription pain medicines, sleeping medicines, or medicines that make you drowsy.  Return to your normal activities as told by your health care provider. Ask your health care provider what activities are safe for you.  Take over-the-counter and prescription medicines only as told by your health care provider.  If you smoke, do not smoke without supervision.  Keep all follow-up visits as told by your health care provider. This is important. Contact a health care provider if:  You have nausea or vomiting that does not get better with medicine.  You cannot eat or drink without vomiting.  You have pain that does not get better with medicine.  You are unable to pass urine.  You develop a skin rash.  You have a fever.  You have redness around your IV site that gets worse. Get help right away if:  You have difficulty breathing.  You have chest pain.  You have blood in your urine or stool, or you vomit blood. Summary  After the procedure, it is common to have a sore throat or nausea. It is also common to feel tired.  Have a responsible adult stay with you for the first 24 hours after general anesthesia. It is important to have someone help care for you until you are awake and alert.  When you feel hungry, start by eating small amounts of foods that are soft and easy to digest (bland), such as toast. Gradually return to your regular diet.  Drink enough fluid to keep your urine pale yellow.  Return to your normal activities as told by your health care provider. Ask your health care provider what activities are safe for you. This information is not intended to replace advice given to you by your health care provider. Make sure you discuss any questions you have with your health care provider. Document  Revised: 01/09/2017 Document Reviewed: 08/22/2016 Elsevier Patient Education  [place].

## 2019-07-15 NOTE — Telephone Encounter (Signed)
Received approval from Dr. Nevada Crane- for pt hold eliquis 48 hour prior and 48 hours after prostate biopsy with Dr. Alyson Ingles. Called pt and instructions reviewed with pt and pt voiced understanding. Will have approval scanned into chart.

## 2019-07-18 ENCOUNTER — Other Ambulatory Visit: Payer: Self-pay

## 2019-07-18 ENCOUNTER — Other Ambulatory Visit (HOSPITAL_COMMUNITY)
Admission: RE | Admit: 2019-07-18 | Discharge: 2019-07-18 | Disposition: A | Discharge status: Home / Self Care | Payer: Medicare Other | Source: Ambulatory Visit | Attending: Urology | Admitting: Urology

## 2019-07-18 ENCOUNTER — Encounter (HOSPITAL_COMMUNITY)
Admission: RE | Admit: 2019-07-18 | Discharge: 2019-07-18 | Disposition: A | Discharge status: Home / Self Care | Payer: Medicare Other | Source: Ambulatory Visit | Attending: Urology | Admitting: Urology

## 2019-07-18 MED ORDER — APIXABAN 5 MG PO TABS: 5.0000 mg | ORAL_TABLET | Freq: Two times a day (BID) | ORAL | 11 refills | Status: DC

## 2019-07-18 NOTE — Telephone Encounter (Signed)
Refilled eliquis to [facility]

## 2019-07-18 NOTE — Progress Notes (Addendum)
Patient has been fully vaccinated, he hasn't had Covid in the last 14 days nor has he been around anyone who has. Patient declined PAT Covid test. Nothing further needed.

## 2019-07-20 ENCOUNTER — Other Ambulatory Visit: Payer: Self-pay | Admitting: Urology

## 2019-07-20 ENCOUNTER — Encounter (HOSPITAL_COMMUNITY)
Admission: RE | Disposition: A | Discharge status: Home / Self Care | Payer: Self-pay | Source: Home / Self Care | Attending: Urology

## 2019-07-20 ENCOUNTER — Ambulatory Visit (HOSPITAL_COMMUNITY)
Admission: RE | Admit: 2019-07-20 | Discharge: 2019-07-20 | Disposition: A | Discharge status: Home / Self Care | Payer: Medicare Other | Source: Ambulatory Visit | Attending: Urology | Admitting: Urology

## 2019-07-20 ENCOUNTER — Ambulatory Visit (HOSPITAL_COMMUNITY): Payer: Medicare Other | Admitting: Anesthesiology

## 2019-07-20 ENCOUNTER — Ambulatory Visit (HOSPITAL_COMMUNITY)
Admission: RE | Admit: 2019-07-20 | Discharge: 2019-07-20 | Disposition: A | Discharge status: Home / Self Care | Payer: Medicare Other | Attending: Urology | Admitting: Urology

## 2019-07-20 ENCOUNTER — Other Ambulatory Visit (HOSPITAL_COMMUNITY): Payer: Self-pay | Admitting: Urology

## 2019-07-20 ENCOUNTER — Encounter (HOSPITAL_COMMUNITY): Payer: Self-pay | Admitting: Urology

## 2019-07-20 DIAGNOSIS — C61 Malignant neoplasm of prostate: Secondary | ICD-10-CM | POA: Diagnosis not present

## 2019-07-20 DIAGNOSIS — Z7951 Long term (current) use of inhaled steroids: Secondary | ICD-10-CM | POA: Insufficient documentation

## 2019-07-20 DIAGNOSIS — E119 Type 2 diabetes mellitus without complications: Secondary | ICD-10-CM | POA: Diagnosis not present

## 2019-07-20 DIAGNOSIS — J449 Chronic obstructive pulmonary disease, unspecified: Secondary | ICD-10-CM | POA: Insufficient documentation

## 2019-07-20 DIAGNOSIS — R972 Elevated prostate specific antigen [PSA]: Secondary | ICD-10-CM | POA: Diagnosis present

## 2019-07-20 DIAGNOSIS — F329 Major depressive disorder, single episode, unspecified: Secondary | ICD-10-CM | POA: Diagnosis not present

## 2019-07-20 DIAGNOSIS — Z7901 Long term (current) use of anticoagulants: Secondary | ICD-10-CM | POA: Diagnosis not present

## 2019-07-20 DIAGNOSIS — Z87891 Personal history of nicotine dependence: Secondary | ICD-10-CM | POA: Diagnosis not present

## 2019-07-20 DIAGNOSIS — Z20822 Contact with and (suspected) exposure to covid-19: Secondary | ICD-10-CM | POA: Insufficient documentation

## 2019-07-20 DIAGNOSIS — F419 Anxiety disorder, unspecified: Secondary | ICD-10-CM | POA: Insufficient documentation

## 2019-07-20 DIAGNOSIS — I4891 Unspecified atrial fibrillation: Secondary | ICD-10-CM | POA: Diagnosis not present

## 2019-07-20 DIAGNOSIS — I10 Essential (primary) hypertension: Secondary | ICD-10-CM | POA: Insufficient documentation

## 2019-07-20 DIAGNOSIS — K219 Gastro-esophageal reflux disease without esophagitis: Secondary | ICD-10-CM | POA: Insufficient documentation

## 2019-07-20 DIAGNOSIS — Z419 Encounter for procedure for purposes other than remedying health state, unspecified: Secondary | ICD-10-CM

## 2019-07-20 HISTORY — PX: PROSTATE BIOPSY: SHX241

## 2019-07-20 LAB — SARS CORONAVIRUS 2 BY RT PCR (HOSPITAL ORDER, PERFORMED IN ~~LOC~~ HOSPITAL LAB): SARS Coronavirus 2: NEGATIVE

## 2019-07-20 LAB — GLUCOSE, CAPILLARY: Glucose-Capillary: 101 mg/dL — ABNORMAL HIGH (ref 70–99)

## 2019-07-20 SURGERY — BIOPSY, PROSTATE
Anesthesia: General

## 2019-07-20 MED ORDER — FENTANYL CITRATE (PF) 100 MCG/2ML IJ SOLN
INTRAMUSCULAR | Status: AC
  Filled 2019-07-20: qty 2

## 2019-07-20 MED ORDER — FENTANYL CITRATE (PF) 100 MCG/2ML IJ SOLN: 25.0000 ug | INTRAMUSCULAR | Status: DC | PRN

## 2019-07-20 MED ORDER — PHENYLEPHRINE HCL (PRESSORS) 10 MG/ML IV SOLN
INTRAVENOUS | Status: DC | PRN
Start: 2019-07-20 — End: 2019-07-20
  Administered 2019-07-20 (×4): 120 ug via INTRAVENOUS

## 2019-07-20 MED ORDER — EPHEDRINE SULFATE 50 MG/ML IJ SOLN
INTRAMUSCULAR | Status: DC | PRN
Start: 2019-07-20 — End: 2019-07-20
  Administered 2019-07-20 (×2): 5 mg via INTRAVENOUS

## 2019-07-20 MED ORDER — PHENYLEPHRINE 40 MCG/ML (10ML) SYRINGE FOR IV PUSH (FOR BLOOD PRESSURE SUPPORT)
PREFILLED_SYRINGE | INTRAVENOUS | Status: AC
  Filled 2019-07-20: qty 30

## 2019-07-20 MED ORDER — KETAMINE HCL 50 MG/5ML IJ SOSY
PREFILLED_SYRINGE | INTRAMUSCULAR | Status: AC
  Filled 2019-07-20: qty 5

## 2019-07-20 MED ORDER — PROPOFOL 10 MG/ML IV BOLUS
INTRAVENOUS | Status: AC
  Filled 2019-07-20: qty 20

## 2019-07-20 MED ORDER — LIDOCAINE HCL (CARDIAC) PF 100 MG/5ML IV SOSY
PREFILLED_SYRINGE | INTRAVENOUS | Status: DC | PRN
  Administered 2019-07-20: 50 mg via INTRAVENOUS

## 2019-07-20 MED ORDER — LACTATED RINGERS IV SOLN
INTRAVENOUS | Status: DC
  Administered 2019-07-20: 1000 mL via INTRAVENOUS

## 2019-07-20 MED ORDER — CHLORHEXIDINE GLUCONATE 0.12 % MT SOLN
15.0000 mL | Freq: Once | OROMUCOSAL | Status: AC
  Administered 2019-07-20: 15 mL via OROMUCOSAL

## 2019-07-20 MED ORDER — LIDOCAINE 2% (20 MG/ML) 5 ML SYRINGE
INTRAMUSCULAR | Status: AC
  Filled 2019-07-20: qty 15

## 2019-07-20 MED ORDER — LIDOCAINE HCL (PF) 2 % IJ SOLN
INTRAMUSCULAR | Status: AC
  Filled 2019-07-20: qty 10

## 2019-07-20 MED ORDER — ORAL CARE MOUTH RINSE: 15.0000 mL | Freq: Once | OROMUCOSAL | Status: AC

## 2019-07-20 MED ORDER — PROPOFOL 10 MG/ML IV BOLUS
INTRAVENOUS | Status: DC | PRN
  Administered 2019-07-20: 20 mg via INTRAVENOUS

## 2019-07-20 MED ORDER — GENTAMICIN SULFATE 40 MG/ML IJ SOLN
5.0000 mg/kg | INTRAVENOUS | Status: AC
  Administered 2019-07-20: 380 mg via INTRAVENOUS
  Filled 2019-07-20: qty 9.5

## 2019-07-20 MED ORDER — ONDANSETRON HCL 4 MG/2ML IJ SOLN: 4.0000 mg | Freq: Once | INTRAMUSCULAR | Status: DC | PRN

## 2019-07-20 MED ORDER — EPHEDRINE 5 MG/ML INJ
INTRAVENOUS | Status: AC
  Filled 2019-07-20: qty 10

## 2019-07-20 MED ORDER — PROPOFOL 500 MG/50ML IV EMUL
INTRAVENOUS | Status: DC | PRN
  Administered 2019-07-20: 120 ug/kg/min via INTRAVENOUS

## 2019-07-20 MED ORDER — DEXAMETHASONE SODIUM PHOSPHATE 10 MG/ML IJ SOLN
INTRAMUSCULAR | Status: AC
  Filled 2019-07-20: qty 2

## 2019-07-20 SURGICAL SUPPLY — 3 items
GLOVE BIO SURGEON STRL SZ8 (GLOVE) ×2 IMPLANT
INST BIOPSY MAXCORE 18GX25 (NEEDLE) IMPLANT
NEEDLE SPNL 22GX7 QUINCKE BK (NEEDLE) IMPLANT

## 2019-07-20 NOTE — OR Nursing (Signed)
12 Prostate biopsies collected by Dr. Alyson Ingles.  12 Specimens left APO #4 with Ultrasound Techs

## 2019-07-20 NOTE — Discharge Instructions (Signed)

## 2019-07-20 NOTE — Anesthesia Preprocedure Evaluation (Signed)
Anesthesia Evaluation  Patient identified by MRN, date of birth, ID band Patient awake    Reviewed: Allergy & Precautions, H&P , NPO status , Patient's Chart, lab work & pertinent test results, reviewed documented beta blocker date and time   Airway Mallampati: II  TM Distance: >3 FB Neck ROM: full    Dental no notable dental hx. (+) Teeth Intact   Pulmonary asthma , COPD,  COPD inhaler, former smoker,    Pulmonary exam normal breath sounds clear to auscultation       Cardiovascular Exercise Tolerance: Good hypertension, negative cardio ROS   Rhythm:regular Rate:Normal     Neuro/Psych PSYCHIATRIC DISORDERS Anxiety Depression negative neurological ROS     GI/Hepatic Neg liver ROS, GERD  Medicated,  Endo/Other  negative endocrine ROSdiabetes  Renal/GU negative Renal ROS  negative genitourinary   Musculoskeletal   Abdominal   Peds  Hematology negative hematology ROS (+)   Anesthesia Other Findings   Reproductive/Obstetrics negative OB ROS                             Anesthesia Physical Anesthesia Plan  ASA: III  Anesthesia Plan: General   Post-op Pain Management:    Induction:   PONV Risk Score and Plan:   Airway Management Planned:   Additional Equipment:   Intra-op Plan:   Post-operative Plan:   Informed Consent: I have reviewed the patients History and Physical, chart, labs and discussed the procedure including the risks, benefits and alternatives for the proposed anesthesia with the patient or authorized representative who has indicated his/her understanding and acceptance.     Dental Advisory Given  Plan Discussed with: CRNA  Anesthesia Plan Comments:         Anesthesia Quick Evaluation

## 2019-07-20 NOTE — Anesthesia Postprocedure Evaluation (Signed)
Anesthesia Post Note  Patient: [name]  Procedure(s) Performed: PROSTATE BIOPSY (N/A )  Patient location during evaluation: PACU Anesthesia Type: General Level of consciousness: awake and alert Pain management: pain level controlled Vital Signs Assessment: post-procedure vital signs reviewed and stable Respiratory status: spontaneous breathing Cardiovascular status: stable Postop Assessment: no apparent nausea or vomiting Anesthetic complications: no   No complications documented.   Last Vitals:  Vitals:   07/20/19 1142 07/20/19 1330  BP: 109/61 (P) 102/65  Pulse: (!) 55 (!) (P) 56  Resp: 20 (P) 16  Temp: 36.5 C (P) 36.8 C  SpO2: 94% (P) 100%    Last Pain:  Vitals:   07/20/19 1142  TempSrc: Oral  PainSc: 0-No pain                 Bryan Hamilton

## 2019-07-20 NOTE — Op Note (Signed)
Pre op diagnosis: Elevated PSA  Post op diagnosis: Elevated PSA        Procedure: Transrectal ultrasound of the prostate, Ultrasound Guided Prostate needle biopsy.   Attending: Nicolette Bang  Anesthesia: General  EBL: minimal  Antibiotics: Gentamicin  Drains: none  Findings: 22.97g prostate with prominent right lobe. Prostate measurements: width 3.82cm, height 4.03cm, length 2.85cm ho hypoechoic or hyperechoic lesions   Indications: Pt is a 74yo male with a history of elevated PSA. After discussing workup he has elected to proceed with prostate biopsy  Procedure in detail: Prior to procedure consent was obtained. The patient was brought to the OR and a brief timeout was done to ensure correct patient, correct procedure, and correct site. General anesthesia was administered and patient was placed in the dorsal lithotomy position. The prostate size was estimated to be 20g by digital rectal exam. The 10 MHz transrectal ultrasound probe was placed into the rectum. Prostate width measured  3.82cm, height 4.03cm, length 2.85cm. Prostate volume was measured to be 35 cc. The biopsy cores were obtained using direct, real-time ultrasound guidance utilizing a standard 12core pattern with one core from the right apex lateral using real time ultrasound guidance to direct the biopsy core being taken from this location, right apex medial using real time ultrasound guidance to direct the biopsy core being taken from this location, left apex lateral using real time ultrasound guidance to direct the biopsy core being taken from this location, left apex medial using real time ultrasound guidance to direct the biopsy core being taken from this location, 2 right mid lateral using real time ultrasound guidance to direct the biopsy core being taken from this location, 2 right mid medial using real time ultrasound guidance to direct the biopsy core being taken from this location, left mid lateral using real time  ultrasound guidance to direct the biopsy core being taken from this location, left mid medial using real time ultrasound guidance to direct the biopsy core being taken from this location, right base lateral using real time ultrasound guidance to direct the biopsy core being taken from this location, right base medial using real time ultrasound guidance to direct the biopsy core being taken from this location, left base lateral using real time ultrasound guidance to direct the biopsy core being taken from this location and left base medial of the prostate using real time ultrasound guidance to direct the biopsy core being taken from this location. The biopsy cores were placed in buffered formalin and sent to pathology. This then concluded the procedure which was well tolerated by the patient.   Complications:. None.   Condition: Stable, extubated, transferred to PACU  Plan: Patient is to be discharged home. They are to followup in 1 week for pathology discussion

## 2019-07-20 NOTE — Transfer of Care (Signed)
Immediate Anesthesia Transfer of Care Note  Patient: Bryan Hamilton  Procedure(s) Performed: PROSTATE BIOPSY (N/A )  Patient Location: PACU  Anesthesia Type:MAC and General  Level of Consciousness: awake, alert , oriented and patient cooperative  Airway & Oxygen Therapy: Patient Spontanous Breathing and Patient connected to face mask oxygen  Post-op Assessment: Report given to RN and Post -op Vital signs reviewed and stable  Post vital signs: Reviewed and stable  Last Vitals:  Vitals Value Taken Time  BP 102/65 07/20/19 1332  Temp    Pulse 67 07/20/19 1334  Resp 18 07/20/19 1334  SpO2 100 % 07/20/19 1334  Vitals shown include unvalidated device data.  Last Pain:  Vitals:   07/20/19 1142  TempSrc: Oral  PainSc: 0-No pain      Patients Stated Pain Goal: 6 (41/03/01 3143)  Complications: No complications documented.

## 2019-07-20 NOTE — Addendum Note (Signed)
Addendum  created 07/20/19 1608 by Vista Deck, CRNA   Charge Capture section accepted

## 2019-07-20 NOTE — Progress Notes (Signed)
Al post op documentation done by Abner Greenspan.

## 2019-07-20 NOTE — Interval H&P Note (Signed)
History and Physical Interval Note:  07/20/2019 11:45 AM  Marcie Bal  has presented today for surgery, with the diagnosis of elevated PSA.  The various methods of treatment have been discussed with the patient and family. After consideration of risks, benefits and other options for treatment, the patient has consented to  Procedure(s): PROSTATE BIOPSY (N/A) as a surgical intervention.  The patient's history has been reviewed, patient examined, no change in status, stable for surgery.  I have reviewed the patient's chart and labs.  Questions were answered to the patient's satisfaction.     Nicolette Bang

## 2019-07-21 ENCOUNTER — Other Ambulatory Visit: Payer: Self-pay

## 2019-07-21 ENCOUNTER — Encounter (HOSPITAL_COMMUNITY): Payer: Self-pay | Admitting: Urology

## 2019-07-21 ENCOUNTER — Emergency Department (HOSPITAL_COMMUNITY)
Admission: EM | Admit: 2019-07-21 | Discharge: 2019-07-21 | Disposition: A | Discharge status: Home / Self Care | Payer: Medicare Other | Attending: Emergency Medicine | Admitting: Emergency Medicine

## 2019-07-21 DIAGNOSIS — R001 Bradycardia, unspecified: Secondary | ICD-10-CM | POA: Insufficient documentation

## 2019-07-21 DIAGNOSIS — E119 Type 2 diabetes mellitus without complications: Secondary | ICD-10-CM | POA: Insufficient documentation

## 2019-07-21 DIAGNOSIS — Z7901 Long term (current) use of anticoagulants: Secondary | ICD-10-CM | POA: Diagnosis not present

## 2019-07-21 DIAGNOSIS — T50901A Poisoning by unspecified drugs, medicaments and biological substances, accidental (unintentional), initial encounter: Secondary | ICD-10-CM | POA: Diagnosis not present

## 2019-07-21 DIAGNOSIS — T447X1A Poisoning by beta-adrenoreceptor antagonists, accidental (unintentional), initial encounter: Secondary | ICD-10-CM | POA: Diagnosis not present

## 2019-07-21 DIAGNOSIS — I10 Essential (primary) hypertension: Secondary | ICD-10-CM | POA: Insufficient documentation

## 2019-07-21 DIAGNOSIS — Z79899 Other long term (current) drug therapy: Secondary | ICD-10-CM | POA: Diagnosis not present

## 2019-07-21 DIAGNOSIS — J449 Chronic obstructive pulmonary disease, unspecified: Secondary | ICD-10-CM | POA: Insufficient documentation

## 2019-07-21 DIAGNOSIS — Z743 Need for continuous supervision: Secondary | ICD-10-CM | POA: Diagnosis not present

## 2019-07-21 DIAGNOSIS — R6889 Other general symptoms and signs: Secondary | ICD-10-CM | POA: Diagnosis not present

## 2019-07-21 DIAGNOSIS — T45511A Poisoning by anticoagulants, accidental (unintentional), initial encounter: Secondary | ICD-10-CM | POA: Diagnosis not present

## 2019-07-21 DIAGNOSIS — I499 Cardiac arrhythmia, unspecified: Secondary | ICD-10-CM | POA: Diagnosis not present

## 2019-07-21 DIAGNOSIS — I4891 Unspecified atrial fibrillation: Secondary | ICD-10-CM | POA: Diagnosis not present

## 2019-07-21 LAB — COMPREHENSIVE METABOLIC PANEL
ALT: 18 U/L (ref 0–44)
AST: 14 U/L — ABNORMAL LOW (ref 15–41)
Albumin: 3.7 g/dL (ref 3.5–5.0)
Alkaline Phosphatase: 80 U/L (ref 38–126)
Anion gap: 10 (ref 5–15)
BUN: 21 mg/dL (ref 8–23)
CO2: 27 mmol/L (ref 22–32)
Calcium: 8.6 mg/dL — ABNORMAL LOW (ref 8.9–10.3)
Chloride: 96 mmol/L — ABNORMAL LOW (ref 98–111)
Creatinine, Ser: 1.23 mg/dL (ref 0.61–1.24)
GFR calc Af Amer: >60 mL/min (ref >60)
GFR calc non Af Amer: 57 mL/min — ABNORMAL LOW (ref >60)
Glucose, Bld: 98 mg/dL (ref 70–99)
Potassium: 4.5 mmol/L (ref 3.5–5.1)
Sodium: 133 mmol/L — ABNORMAL LOW (ref 135–145)
Total Bilirubin: 0.5 mg/dL (ref 0.3–1.2)
Total Protein: 6.7 g/dL (ref 6.5–8.1)

## 2019-07-21 LAB — CBC WITH DIFFERENTIAL/PLATELET
Abs Immature Granulocytes: 0.08 10*3/uL — ABNORMAL HIGH (ref 0.00–0.07)
Basophils Absolute: 0.1 10*3/uL (ref 0.0–0.1)
Basophils Relative: 1 %
Eosinophils Absolute: 0.2 10*3/uL (ref 0.0–0.5)
Eosinophils Relative: 3 %
HCT: 38.8 % — ABNORMAL LOW (ref 39.0–52.0)
Hemoglobin: 12.6 g/dL — ABNORMAL LOW (ref 13.0–17.0)
Immature Granulocytes: 1 %
Lymphocytes Relative: 13 %
Lymphs Abs: 1.1 10*3/uL (ref 0.7–4.0)
MCH: 31.4 pg (ref 26.0–34.0)
MCHC: 32.5 g/dL (ref 30.0–36.0)
MCV: 96.8 fL (ref 80.0–100.0)
Monocytes Absolute: 1.3 10*3/uL — ABNORMAL HIGH (ref 0.1–1.0)
Monocytes Relative: 15 %
Neutro Abs: 5.5 10*3/uL (ref 1.7–7.7)
Neutrophils Relative %: 67 %
Platelets: 304 10*3/uL (ref 150–400)
RBC: 4.01 MIL/uL — ABNORMAL LOW (ref 4.22–5.81)
RDW: 15.3 % (ref 11.5–15.5)
WBC: 8.2 10*3/uL (ref 4.0–10.5)
nRBC: 0 % (ref 0.0–0.2)

## 2019-07-21 MED ORDER — SODIUM CHLORIDE 0.9 % IV BOLUS
1000.0000 mL | Freq: Once | INTRAVENOUS | Status: AC
  Administered 2019-07-21: 1000 mL via INTRAVENOUS

## 2019-07-21 NOTE — ED Notes (Signed)
Pt given something to drink per MD approval.

## 2019-07-21 NOTE — ED Triage Notes (Signed)
Pt arrived EMS. Pt went to Dr a few weeks ago and he was told he had a low HR. Dr gave him a RX for [employer]. Pt has been monitoring his HR since, today its 51.

## 2019-07-21 NOTE — ED Provider Notes (Signed)
[facility] EMERGENCY DEPARTMENT Provider Note   CSN: [id number] Arrival date & time: 07/21/19  1257     History Chief Complaint  Patient presents with  . Bradycardia    Bryan Hamilton is a 74 y.o. male.  HPI 74 year old male presents after accidentally taking his night meds this morning.  He took his night medicines last night.  This morning he accidentally took his night meds again.  This includes his metoprolol, Lipitor, Eliquis, Metformin, Zoloft.  He has not felt bad.  His wife called the son who had his paramedic girlfriend take a look at the patient.  His heart rate was low, at 1 point in the 30s.  Patient has not felt bad.  Because of this he was sent to the emergency room via ambulance.  They gave him 500 cc IV fluid.  He feels normal and denies headache, dizziness, chest pain, shortness of breath, or weakness.  Did not take his morning meds this morning.   Past Medical History:  Diagnosis Date  . Anxiety   . Arrhythmia   . Arthritis   . Asthma   . Atrial fibrillation ([place])   . COPD (chronic obstructive pulmonary disease) ([place])   . Depression   . Diabetes mellitus without complication ([place])   . GERD (gastroesophageal reflux disease)   . Hyperchloremia   . Hypertension     Patient Active Problem List   Diagnosis Date Noted  . Elevated PSA 07/13/2019  . Right hip pain   . New onset atrial fibrillation ([place]) 05/12/2019    Past Surgical History:  Procedure Laterality Date  . BACK SURGERY    . PROSTATE BIOPSY N/A 07/20/2019   Procedure: PROSTATE BIOPSY;  Surgeon: Cleon Gustin, MD;  Location: [facility];  Service: Urology;  Laterality: N/A;       Family History  Problem Relation Age of Onset  . Cancer Father     Social History   Tobacco Use  . Smoking status: Former Research scientist (life sciences)  . Smokeless tobacco: Never Used  Vaping Use  . Vaping Use: Never used  Substance Use Topics  . Alcohol use: Yes    Comment: occ  . Drug use: Never    Home  Medications Prior to Admission medications   Medication Sig Start Date End Date Taking? Authorizing Provider  acetaminophen (TYLENOL) 650 MG CR tablet Take 1,300 mg by mouth 2 (two) times daily as needed for pain.    [provider]  ALPRAZolam Duanne Moron) 0.25 MG tablet Take 0.25 mg by mouth daily as needed for anxiety.  07/12/19   [provider]  apixaban (ELIQUIS) 5 MG TABS tablet Take 1 tablet (5 mg total) by mouth 2 (two) times daily. 07/18/19   Herminio Commons, MD  atorvastatin (LIPITOR) 20 MG tablet Take 20 mg by mouth at bedtime.  03/07/19   [provider]  diclofenac Sodium (VOLTAREN) 1 % GEL Apply 2 g topically daily as needed (Pain).    [provider]  diltiazem (CARDIZEM CD) 300 MG 24 hr capsule Take 1 capsule (300 mg total) by mouth daily. 05/23/19   Herminio Commons, MD  methocarbamol (ROBAXIN) 500 MG tablet Take 1 tablet (500 mg total) by mouth every 8 (eight) hours as needed for muscle spasms. 05/14/19   Orson Eva, MD  metoprolol tartrate (LOPRESSOR) 50 MG tablet Take 50 mg by mouth 2 (two) times daily. 05/16/19   [provider]  Omega-3 Fatty Acids (OMEGA 3 PO) Take 6 capsules by  mouth daily.    [provider]  Polyethyl Glycol-Propyl Glycol (SYSTANE) 0.4-0.3 % SOLN Place 1-2 drops into both eyes daily as needed (Dry eye).     [provider]  PROAIR HFA 108 [phone] Base) MCG/ACT inhaler Inhale 2 puffs into the lungs every 6 (six) hours as needed for wheezing or shortness of breath.  03/31/19   [provider]  sertraline (ZOLOFT) 25 MG tablet Take 25 mg by mouth at bedtime.  07/12/19   [provider]  traMADol (ULTRAM) 50 MG tablet Take 1 tablet (50 mg total) by mouth every 6 (six) hours as needed for moderate pain. 05/14/19   Orson Eva, MD  TRELEGY ELLIPTA 100-62.5-25 MCG/INH AEPB Inhale 1 puff into the lungs daily. 03/31/19   [provider]    Allergies    Patient has no known  allergies.  Review of Systems   Review of Systems  Respiratory: Negative for shortness of breath.   Cardiovascular: Negative for chest pain.  Gastrointestinal: Negative for vomiting.  Neurological: Negative for light-headedness and headaches.  All other systems reviewed and are negative.   Physical Exam Updated Vital Signs BP 123/74 (BP Location: Right Arm)   Pulse 73   Temp (!) 97.4 F (36.3 C) (Oral)   Resp 18   Ht 5\' 8"  (1.727 m)   Wt 88.5 kg   SpO2 95%   BMI 29.65 kg/m   Physical Exam Vitals and nursing note reviewed.  Constitutional:      General: He is not in acute distress.    Appearance: He is well-developed. He is obese. He is not ill-appearing or diaphoretic.  HENT:     Head: Normocephalic and atraumatic.     Right Ear: External ear normal.     Left Ear: External ear normal.     Nose: Nose normal.  Eyes:     General:        Right eye: No discharge.        Left eye: No discharge.  Cardiovascular:     Rate and Rhythm: Regular rhythm. Bradycardia present.     Heart sounds: Normal heart sounds.  Pulmonary:     Effort: Pulmonary effort is normal.     Breath sounds: Normal breath sounds.  Abdominal:     Palpations: Abdomen is soft.     Tenderness: There is no abdominal tenderness.  Musculoskeletal:     Cervical back: Neck supple.  Skin:    General: Skin is warm and dry.  Neurological:     Mental Status: He is alert.  Psychiatric:        Mood and Affect: Mood is not anxious.     ED Results / Procedures / Treatments   Labs (all labs ordered are listed, but only abnormal results are displayed) Labs Reviewed  COMPREHENSIVE METABOLIC PANEL - Abnormal; Notable for the following components:      Result Value   Sodium 133 (*)    Chloride 96 (*)    Calcium 8.6 (*)    AST 14 (*)    GFR calc non Af Amer 57 (*)    All other components within normal limits  CBC WITH DIFFERENTIAL/PLATELET - Abnormal; Notable for the following components:   RBC 4.01 (*)     Hemoglobin 12.6 (*)    HCT 38.8 (*)    Monocytes Absolute 1.3 (*)    Abs Immature Granulocytes 0.08 (*)    All other components within normal limits    EKG EKG Interpretation  Date/Time:  Thursday July 21 2019 13:08:05 EDT Ventricular Rate:  57 PR Interval:    QRS Duration: 98 QT Interval:  471 QTC Calculation: 459 R Axis:   -16 Text Interpretation: Atrial fibrillation Borderline left axis deviation rate is slower compared to April 2021 Confirmed by Sherwood Gambler [phone]) on 07/21/2019 1:34:37 PM   Radiology [place] Intraoperative  Result Date: 07/20/2019 CLINICAL DATA:  Ultrasound was provided for use by the ordering physician, and a technical charge was applied by the performing facility.  No radiologist interpretation/professional services rendered.    Procedures Procedures (including critical care time)  Medications Ordered in ED Medications  sodium chloride 0.9 % bolus 1,000 mL (0 mLs Intravenous Stopped 07/21/19 1512)    ED Course  I have reviewed the triage vital signs and the nursing notes.  Pertinent labs & imaging results that were available during my care of the patient were reviewed by me and considered in my medical decision making (see chart for details).    MDM Rules/Calculators/A&P                          Some of the patient's medicines, such as the metoprolol and Eliquis, are twice daily medicines anyway so he did not do any extra damage there.  Other meds such as Metformin, Zoloft, and Lipitor are unlikely to cause any significant harm by taking early.  He is bradycardic here but not significantly so and his blood pressure has been stable.  It was soft on first arrival but on recheck prior to any treatment it is normal.  He has no symptoms.  This was an accidental overdose.  He appears stable for discharge to follow-up with his PCP. Final Clinical Impression(s) / ED Diagnoses Final diagnoses:  Accidental overdose, initial encounter    Rx / DC Orders ED  Discharge Orders    None       Sherwood Gambler, MD 07/21/19 [phone]

## 2019-07-27 ENCOUNTER — Other Ambulatory Visit: Payer: Self-pay

## 2019-07-27 ENCOUNTER — Ambulatory Visit (INDEPENDENT_AMBULATORY_CARE_PROVIDER_SITE_OTHER): Payer: Medicare Other | Admitting: Urology

## 2019-07-27 ENCOUNTER — Encounter: Payer: Self-pay | Admitting: Urology

## 2019-07-27 VITALS — BP 122/66 | HR 74 | Temp 97.9°F | Ht 68.0 in | Wt 195.0 lb

## 2019-07-27 DIAGNOSIS — C61 Malignant neoplasm of prostate: Secondary | ICD-10-CM

## 2019-07-27 NOTE — Patient Instructions (Signed)
Prostate Cancer  The prostate is a male gland that helps make semen. Prostate cancer is when abnormal cells grow in this gland. Follow these instructions at home:  Take over-the-counter and prescription medicines only as told by your doctor.  Eat a healthy diet.  Get plenty of sleep.  Ask your doctor for help to find a support group for men with prostate cancer.  Keep all follow-up visits as told by your doctor. This is important.  If you have to go to the hospital, let your cancer doctor (oncologist) know.  Touch, hold, hug, and caress your partner to continue to show sexual feelings. Contact a doctor if:  You have trouble peeing (urinating).  You have blood in your pee (urine).  You have pain in your hips, back, or chest. Get help right away if:  You have weakness in your legs.  You lose feeling (have numbness) in your legs.  You cannot control your pee or your poop (stool).  You have trouble breathing.  You have sudden pain in your chest.  You have chills or a fever. Summary  The prostate is a male gland that helps make semen. Prostate cancer is when abnormal cells grow in this gland.  Ask your doctor for help to find a support group for men with prostate cancer.  Contact a doctor if you have problems peeing or have any new pain that you did not have before. This information is not intended to replace advice given to you by your health care provider. Make sure you discuss any questions you have with your health care provider. Document Revised: 12/19/2016 Document Reviewed: 09/17/2015 Elsevier Patient Education  2020 Elsevier Inc.  

## 2019-07-27 NOTE — Progress Notes (Signed)
07/27/2019 3:48 PM   Bryan Hamilton 07/03/1945 [id number]  Referring provider: Celene Squibb, MD [place],  [place] [address]  Prostate Cancer  HPI: Mr Early is a 74yo here for followup after prostate biopsy. 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 PSA was 16.9. NO significant LUTS. He is on eliquis for afib.    PMH: Past Medical History:  Diagnosis Date  . Anxiety   . Arrhythmia   . Arthritis   . Asthma   . Atrial fibrillation ([place])   . COPD (chronic obstructive pulmonary disease) ([place])   . Depression   . Diabetes mellitus without complication ([place])   . GERD (gastroesophageal reflux disease)   . Hyperchloremia   . Hypertension     Surgical History: Past Surgical History:  Procedure Laterality Date  . BACK SURGERY    . PROSTATE BIOPSY N/A 07/20/2019   Procedure: PROSTATE BIOPSY;  Surgeon: Cleon Gustin, MD;  Location: [facility];  Service: Urology;  Laterality: N/A;    Home Medications:  Allergies as of 07/27/2019   No Known Allergies     Medication List       Accurate as of July 27, 2019  3:48 PM. If you have any questions, ask your nurse or doctor.        acetaminophen 650 MG CR tablet Commonly known as: TYLENOL Take 1,300 mg by mouth 2 (two) times daily as needed for pain.   ALPRAZolam 0.25 MG tablet Commonly known as: XANAX Take 0.25 mg by mouth daily as needed for anxiety.   apixaban 5 MG Tabs tablet Commonly known as: ELIQUIS Take 1 tablet (5 mg total) by mouth 2 (two) times daily.   atorvastatin 20 MG tablet Commonly known as: LIPITOR Take 20 mg by mouth at bedtime.   diclofenac Sodium 1 % Gel Commonly known as: VOLTAREN Apply 2 g topically daily as needed (Pain).   diltiazem 300 MG 24 hr capsule Commonly known as: Cardizem CD Take 1 capsule (300 mg total) by mouth daily.   methocarbamol 500 MG tablet Commonly known as: ROBAXIN Take 1 tablet (500 mg total) by mouth every 8  (eight) hours as needed for muscle spasms.   metoprolol tartrate 50 MG tablet Commonly known as: LOPRESSOR Take 50 mg by mouth 2 (two) times daily.   OMEGA 3 PO Take 6 capsules by mouth daily.   ProAir HFA 108 (90 Base) MCG/ACT inhaler Generic drug: albuterol Inhale 2 puffs into the lungs every 6 (six) hours as needed for wheezing or shortness of breath.   sertraline 25 MG tablet Commonly known as: ZOLOFT Take 25 mg by mouth at bedtime.   Systane 0.4-0.3 % Soln Generic drug: Polyethyl Glycol-Propyl Glycol Place 1-2 drops into both eyes daily as needed (Dry eye).   traMADol 50 MG tablet Commonly known as: ULTRAM Take 1 tablet (50 mg total) by mouth every 6 (six) hours as needed for moderate pain.   Trelegy Ellipta 100-62.5-25 MCG/INH Aepb Generic drug: Fluticasone-Umeclidin-Vilant Inhale 1 puff into the lungs daily.       Allergies: No Known Allergies  Family History: Family History  Problem Relation Age of Onset  . Cancer Father     Social History:  reports that he has quit smoking. He has never used smokeless tobacco. He reports current alcohol use. He reports that he does not use drugs.  ROS: All other review of systems were reviewed and are negative except what  is noted above in HPI  Physical Exam: BP 122/66   Pulse 74   Temp 97.9 F (36.6 C)   Ht 5\' 8"  (1.727 m)   Wt 195 lb (88.5 kg)   BMI 29.65 kg/m   Constitutional:  Alert and oriented, No acute distress. HEENT: [place] AT, moist mucus membranes.  Trachea midline, no masses. Cardiovascular: No clubbing, cyanosis, or edema. Respiratory: Normal respiratory effort, no increased work of breathing. GI: Abdomen is soft, nontender, nondistended, no abdominal masses GU: No CVA tenderness.  Lymph: No cervical or inguinal lymphadenopathy. Skin: No rashes, bruises or suspicious lesions. Neurologic: Grossly intact, no focal deficits, moving all 4 extremities. Psychiatric: Normal mood and affect.  Laboratory  Data: Lab Results  Component Value Date   WBC 8.2 07/21/2019   HGB 12.6 (L) 07/21/2019   HCT 38.8 (L) 07/21/2019   MCV 96.8 07/21/2019   PLT 304 07/21/2019    Lab Results  Component Value Date   CREATININE 1.23 07/21/2019    No results found for: PSA  No results found for: TESTOSTERONE  No results found for: HGBA1C  Urinalysis No results found for: COLORURINE, APPEARANCEUR, LABSPEC, PHURINE, GLUCOSEU, HGBUR, BILIRUBINUR, KETONESUR, PROTEINUR, UROBILINOGEN, NITRITE, LEUKOCYTESUR  No results found for: LABMICR, [place], RBCUA, LABEPIT, MUCUS, BACTERIA  Pertinent Imaging:  No results found for this or any previous visit.  No results found for this or any previous visit.  No results found for this or any previous visit.  No results found for this or any previous visit.  No results found for this or any previous visit.  No results found for this or any previous visit.  No results found for this or any previous visit.  No results found for this or any previous visit.   Assessment & Plan:   1. Prostate Cancer: -I discussed the natural history of intermediate risk prostate cancer with the patient and the various treatment options including active surveillance, RALP, IMRT, brachytherapy, cryotherapy, HIFU and ADT. After discussing the options the patient elects for radiation therapy. I will refer him to Dr. Tammi Klippel. We will also obtain a CT and bone scan prior to his visit with radiation oncology.  No follow-ups on file.  Nicolette Bang, MD  [facility] [place]

## 2019-08-01 ENCOUNTER — Other Ambulatory Visit: Payer: Self-pay

## 2019-08-01 ENCOUNTER — Ambulatory Visit
Admission: RE | Admit: 2019-08-01 | Discharge: 2019-08-01 | Disposition: A | Discharge status: Home / Self Care | Payer: Medicare Other | Source: Ambulatory Visit | Attending: Radiation Oncology | Admitting: Radiation Oncology

## 2019-08-01 ENCOUNTER — Encounter: Payer: Self-pay | Admitting: Radiation Oncology

## 2019-08-01 VITALS — Ht 68.0 in | Wt 195.0 lb

## 2019-08-01 DIAGNOSIS — Z809 Family history of malignant neoplasm, unspecified: Secondary | ICD-10-CM | POA: Diagnosis not present

## 2019-08-01 DIAGNOSIS — C61 Malignant neoplasm of prostate: Secondary | ICD-10-CM

## 2019-08-01 HISTORY — DX: Malignant neoplasm of prostate: C61

## 2019-08-01 NOTE — Progress Notes (Signed)
Radiation Oncology         [phone] ________________________________   Initial Outpatient Consultation - Conducted via telephone due to current COVID-19 concerns for limiting patient exposure  Name: Bryan Hamilton MRN: [id number]  Date: 08/01/2019  DOB: [birth year]  DG:LOVF, Edwinna Areola, MD  McKenzie, Candee Furbish, MD   REFERRING PHYSICIAN: Cleon Gustin, MD  DIAGNOSIS: 74 y.o. gentleman with Stage T1c adenocarcinoma of the prostate with Gleason score of 4+3, and PSA of 16.9.    ICD-10-CM   1. Malignant neoplasm of prostate ([place])  C61   2. Prostate cancer [facility])  C61     HISTORY OF PRESENT ILLNESS: Bryan Hamilton is a 74 y.o. male with a diagnosis of prostate cancer. He was noted to have an elevated PSA of 10 by his primary care physician, Dr. Nevada Crane.  Accordingly, he was referred for evaluation in urology by Dr. Alyson Ingles on 6/23 and repeat PSA was 16.9 the prior week.  The patient proceeded to transrectal ultrasound with 12 biopsies of the prostate on 6/30.  The prostate volume measured 22.97 cc.  Out of 12 core biopsies, 3 were positive.  The maximum Gleason score was 4+3, and this was seen in right apex, right lateral apex and right lateral mid.  The patient reviewed the biopsy results with his urologist and he has kindly been referred today for discussion of potential radiation treatment options.   PREVIOUS RADIATION THERAPY: No  PAST MEDICAL HISTORY:  Past Medical History:  Diagnosis Date   Anxiety    Arrhythmia    Arthritis    Asthma    Atrial fibrillation (HCC)    COPD (chronic obstructive pulmonary disease) ([place])    Depression    Diabetes mellitus without complication (HCC)    GERD (gastroesophageal reflux disease)    Hyperchloremia    Hypertension    Prostate cancer ([place])       PAST SURGICAL HISTORY: Past Surgical History:  Procedure Laterality Date   BACK SURGERY     PROSTATE BIOPSY N/A 07/20/2019   Procedure: PROSTATE BIOPSY;  Surgeon:  Cleon Gustin, MD;  Location: [facility];  Service: Urology;  Laterality: N/A;    FAMILY HISTORY:  Family History  Problem Relation Age of Onset   Cancer Father        type unknown   Breast cancer Neg Hx    Prostate cancer Neg Hx    Colon cancer Neg Hx    Pancreatic cancer Neg Hx     SOCIAL HISTORY:  Social History   Socioeconomic History   Marital status: Married    Spouse name: Not on file   Number of children: 2   Years of education: Not on file   Highest education level: Not on file  Occupational History   Not on file  Tobacco Use   Smoking status: Former Smoker    Packs/day: 2.00    Years: 40.00    Pack years: 80.00    Types: Cigarettes    Quit date: 01/21/2008    Years since quitting: 11.5   Smokeless tobacco: Never Used  Scientific laboratory technician Use: Never used  Substance and Sexual Activity   Alcohol use: Yes    Comment: occasionally   Drug use: Never   Sexual activity: Not Currently  Other Topics Concern   Not on file  Social History Narrative   Not on file   Social Determinants of Health   Financial Resource Strain:    Difficulty of  Paying Living Expenses:   Food Insecurity:    Worried About Charity fundraiser in the Last Year:    Arboriculturist in the Last Year:   Transportation Needs:    Film/video editor (Medical):    Lack of Transportation (Non-Medical):   Physical Activity:    Days of Exercise per Week:    Minutes of Exercise per Session:   Stress:    Feeling of Stress :   Social Connections:    Frequency of Communication with Friends and Family:    Frequency of Social Gatherings with Friends and Family:    Attends Religious Services:    Active Member of Clubs or Organizations:    Attends Archivist Meetings:    Marital Status:   Intimate Partner Violence:    Fear of Current or Ex-Partner:    Emotionally Abused:    Physically Abused:    Sexually Abused:     ALLERGIES: Patient has  no known allergies.  MEDICATIONS:  Current Outpatient Medications  Medication Sig Dispense Refill   acetaminophen (TYLENOL) 650 MG CR tablet Take 1,300 mg by mouth 2 (two) times daily as needed for pain.     ALPRAZolam (XANAX) 0.25 MG tablet Take 0.25 mg by mouth daily as needed for anxiety.      apixaban (ELIQUIS) 5 MG TABS tablet Take 1 tablet (5 mg total) by mouth 2 (two) times daily. 60 tablet 11   atorvastatin (LIPITOR) 20 MG tablet Take 20 mg by mouth at bedtime.      diclofenac Sodium (VOLTAREN) 1 % GEL Apply 2 g topically daily as needed (Pain).     diltiazem (CARDIZEM CD) 300 MG 24 hr capsule Take 1 capsule (300 mg total) by mouth daily. 90 capsule 3   methocarbamol (ROBAXIN) 500 MG tablet Take 1 tablet (500 mg total) by mouth every 8 (eight) hours as needed for muscle spasms. 15 tablet 0   metoprolol tartrate (LOPRESSOR) 50 MG tablet Take 50 mg by mouth 2 (two) times daily.     Omega-3 Fatty Acids (OMEGA 3 PO) Take 6 capsules by mouth daily.     Polyethyl Glycol-Propyl Glycol (SYSTANE) 0.4-0.3 % SOLN Place 1-2 drops into both eyes daily as needed (Dry eye).      PROAIR HFA 108 (90 Base) MCG/ACT inhaler Inhale 2 puffs into the lungs every 6 (six) hours as needed for wheezing or shortness of breath.      sertraline (ZOLOFT) 25 MG tablet Take 25 mg by mouth at bedtime.      traMADol (ULTRAM) 50 MG tablet Take 1 tablet (50 mg total) by mouth every 6 (six) hours as needed for moderate pain. 12 tablet 0   TRELEGY ELLIPTA 100-62.5-25 MCG/INH AEPB Inhale 1 puff into the lungs daily.     No current facility-administered medications for this encounter.    REVIEW OF SYSTEMS:  On review of systems, the patient reports that he is doing well overall. He denies any chest pain, shortness of breath, cough, fevers, chills, night sweats, unintended weight changes. He denies any bowel disturbances, and denies abdominal pain, nausea or vomiting. He denies any new musculoskeletal or joint  aches or pains. His IPSS was 9, indicating mild to moderate urinary symptoms. His SHIM was 9, indicating he mild to moderate erectile dysfunction. A complete review of systems is obtained and is otherwise negative.    PHYSICAL EXAM:  Wt Readings from Last 3 Encounters:  08/01/19 195 lb (88.5 kg)  07/27/19  195 lb (88.5 kg)  07/21/19 195 lb (88.5 kg)   Temp Readings from Last 3 Encounters:  07/27/19 97.9 F (36.6 C)  07/21/19 (!) 97.4 F (36.3 C) (Oral)  07/20/19 (!) 97.3 F (36.3 C) (Axillary)   BP Readings from Last 3 Encounters:  07/27/19 122/66  07/21/19 123/74  07/20/19 114/60   Pulse Readings from Last 3 Encounters:  07/27/19 74  07/21/19 73  07/20/19 75   Pain Assessment Pain Score: 0-No pain/10  I was unable personally perform physical exam due to the telephone interview.  According to Urology the physical findings were as follows:Constitutional:  Alert and oriented, No acute distress. HEENT: [place] AT, moist mucus membranes.  Trachea midline, no masses. Cardiovascular: No clubbing, cyanosis, or edema. Respiratory: Normal respiratory effort, no increased work of breathing. GI: Abdomen is soft, nontender, nondistended, no abdominal masses GU: No CVA tenderness.   Lymph: No cervical or inguinal lymphadenopathy. Skin: No rashes, bruises or suspicious lesions. Neurologic: Grossly intact, no focal deficits, moving all 4 extremities. Psychiatric: Normal mood and affect.    KPS = 100  100 - Normal; no complaints; no evidence of disease. 90   - Able to carry on normal activity; minor signs or symptoms of disease. 80   - Normal activity with effort; some signs or symptoms of disease. 76   - Cares for self; unable to carry on normal activity or to do active work. 60   - Requires occasional assistance, but is able to care for most of his personal needs. 50   - Requires considerable assistance and frequent medical care. 13   - Disabled; requires special care and  assistance. 28   - Severely disabled; hospital admission is indicated although death not imminent. 94   - Very sick; hospital admission necessary; active supportive treatment necessary. 10   - Moribund; fatal processes progressing rapidly. 0     - Dead  Karnofsky DA, Abelmann [place], Craver LS and Burchenal [facility] [phone]) The use of the nitrogen mustards in the palliative treatment of carcinoma: with particular reference to bronchogenic carcinoma Cancer 1 634-56  LABORATORY DATA:  Lab Results  Component Value Date   WBC 8.2 07/21/2019   HGB 12.6 (L) 07/21/2019   HCT 38.8 (L) 07/21/2019   MCV 96.8 07/21/2019   PLT 304 07/21/2019   Lab Results  Component Value Date   NA 133 (L) 07/21/2019   K 4.5 07/21/2019   CL 96 (L) 07/21/2019   CO2 27 07/21/2019   Lab Results  Component Value Date   ALT 18 07/21/2019   AST 14 (L) 07/21/2019   ALKPHOS 80 07/21/2019   BILITOT 0.5 07/21/2019     RADIOGRAPHY: [place] Intraoperative  Result Date: 07/20/2019 CLINICAL DATA:  Ultrasound was provided for use by the ordering physician, and a technical charge was applied by the performing facility.  No radiologist interpretation/professional services rendered.      IMPRESSION/PLAN: 1. 74 y.o. gentleman with Stage T1c adenocarcinoma of the prostate with Gleason Score of 4+3, and PSA of 16.9. We discussed the patient's workup and outlined the nature of prostate cancer in this setting. The patient's T stage, Gleason's score, and PSA put him into the unfavorable intermediate risk (UIR) group. Accordingly, he is eligible for a variety of potential treatment options including brachytherapy, 5.5-8 weeks of external radiation, or prostatectomy. We discussed the available radiation techniques, and focused on the details and logistics and delivery.  We discussed and outlined the risks, benefits, short and long-term effects  associated with radiotherapy and compared and contrasted these with prostatectomy. We discussed the role  of SpaceOAR in reducing the rectal toxicity associated with radiotherapy.   At the conclusion of our conversation the patient is interested in moving forward with brachytherapy using SpaceOAR.  We spent 60 minutes in chart prep, on the phone, completing the note and placing orders.  Given current concerns for patient exposure during the COVID-19 pandemic, this encounter was conducted via telephone. The patient was notified in advance and was offered a [place] meeting to allow for face to face communication but unfortunately reported that he/she did not have the appropriate resources/technology to support such a visit and instead preferred to proceed with telephone consult. The patient has given verbal consent for this type of encounter. The time spent during this encounter was 60 minutes. The attendants for this meeting include Tyler Pita MD, patient, and family members daughter Verdis Frederickson. During the encounter, Tyler Pita MD was located at [facility] Radiation Oncology Department.  Patient, Mr. Storlie and family, Verdis Frederickson were located at home.      Tyler Pita, MD  [facility]   Radiation Oncology Direct Dial: [phone]   Fax: [phone] [email or web address]   Skype   LinkedIn

## 2019-08-01 NOTE — Progress Notes (Signed)
GU Location of Tumor / Histology: prostatic adenocarcinoma  If Prostate Cancer, Gleason Score is (4 + 3) and PSA is (16.9). Prostate volume: 22.97 grams.  Bryan Hamilton reports being told by his PCP several year ago that his PSA was climbing. The patient reports he presented to his PCP in March 2021 for routine blood work and an elevated PSA of 10 was noted thus he was to Dr. Alyson Ingles. Dr. Alyson Ingles noted his PSA had risen in mid June to16.9 thus a prostate biopsy was done.   Biopsies of prostate (if applicable) revealed:    Past/Anticipated interventions by urology, if any: prostate biopsy, ordered CT abd/pelvis (scheduled for 08/18/19), ordered bone scan (scheduled for 08/17/19)  Past/Anticipated interventions by medical oncology, if any: no  Weight changes, if any: no  Bowel/Bladder complaints, if any: IPSS 9. SHIM 9. Denies dysuria, hematuria, leakage or incontinence. Denies any bowel complaints.    Nausea/Vomiting, if any: no  Pain issues, if any:  Reports intermittent right hip and low back pain. Patient denies any recent falls but does endorse using a walker or wheelchair to transfer. Daughter reports her father's ability to use his right leg has decline greatly in the last several months to the point he is dragging it. Patient denies additional weakness elsewhere in his body.   SAFETY ISSUES:  Prior radiation? denies  Pacemaker/ICD? denies  Possible current pregnancy? no, male patient  Is the patient on methotrexate? denies  Current Complaints / other details:  74 year old. Retired. Married with two grown daughters.

## 2019-08-02 ENCOUNTER — Other Ambulatory Visit (HOSPITAL_COMMUNITY): Payer: Medicare Other

## 2019-08-05 ENCOUNTER — Other Ambulatory Visit: Payer: Self-pay | Admitting: Urology

## 2019-08-08 ENCOUNTER — Telehealth: Payer: Self-pay | Admitting: *Deleted

## 2019-08-08 NOTE — Telephone Encounter (Signed)
CALLED PATIENT TO INFORM OF PRE-SEED APPTS. AND IMPLANT, SPOKE WITH DAUGHTER MARIA HUDSON AND SHE IS AWARE OF THESE APPTS.

## 2019-08-09 ENCOUNTER — Telehealth: Payer: Self-pay | Admitting: *Deleted

## 2019-08-09 ENCOUNTER — Encounter: Payer: Self-pay | Admitting: Medical Oncology

## 2019-08-09 NOTE — Progress Notes (Signed)
Called patient and spoke with his daughter to introduce myself as the prostate nurse navigator and discuss my role. I was unable to meet them 7/12, when he consulted with Dr. Shepard [facility]. She states the consult went well and he would like to do brachytherapy as treatment. He has CT and bone scan scheduled 7/28, so when he gets results, he will know how to proceed. I gave her my contact information and asked her to call me with questions or concerns. She voiced understanding and was very appreciative of my call.

## 2019-08-09 NOTE — Telephone Encounter (Signed)
Surgery is not until 9/24- the patient will need to be contacted in Sept for clearance.  Kerin Ransom PA-C 08/09/2019 1:37 PM

## 2019-08-09 NOTE — Telephone Encounter (Signed)
Patient with diagnosis of afib on Eliquis for anticoagulation.    Procedure: BRACHYTHERAPY and SPACE OAR  Date of procedure: 10/14/19  CHADS2-VASc score of  3 (HTN, AGE, AGE)  CrCl 49 ml/min  Per office protocol, patient can hold Eliquis for 2 days prior to procedure.

## 2019-08-09 NOTE — Telephone Encounter (Signed)
° °   [facility] Pre-operative Risk Assessment    HEARTCARE STAFF: - Please ensure there is not already an duplicate clearance open for this procedure. - Under Visit Info/Reason for Call, type in Other and utilize the format Clearance MM/DD/YY or Clearance TBD. Do not use dashes or single digits. - If request is for dental extraction, please clarify the # of teeth to be extracted.  Request for surgical clearance:  1. What type of surgery is being performed? BRACHYTHERAPY and SPACE OAR   2. When is this surgery scheduled? 10/14/19   3. What type of clearance is required (medical clearance vs. Pharmacy clearance to hold med vs. Both)? BOTH  4. Are there any medications that need to be held prior to surgery and how long? ELIQUIS x 48 HOURS PRIOR TO PROCEDURE   5. Practice name and name of physician performing surgery? [facility]; DR. [place]   6. What is the office phone number? [phone]   7.   What is the office fax number? [phone]  8.   Anesthesia type (None, local, MAC, general) ? GENERAL   Julaine Hua 08/09/2019, 9:36 AM  _________________________________________________________________   (provider comments below)

## 2019-08-17 ENCOUNTER — Encounter (HOSPITAL_COMMUNITY)
Admission: RE | Admit: 2019-08-17 | Discharge: 2019-08-17 | Disposition: A | Discharge status: Home / Self Care | Payer: Medicare Other | Source: Ambulatory Visit | Attending: Urology | Admitting: Urology

## 2019-08-17 ENCOUNTER — Encounter (HOSPITAL_COMMUNITY): Payer: Self-pay

## 2019-08-17 ENCOUNTER — Other Ambulatory Visit: Payer: Self-pay

## 2019-08-17 DIAGNOSIS — C61 Malignant neoplasm of prostate: Secondary | ICD-10-CM | POA: Diagnosis not present

## 2019-08-17 MED ORDER — TECHNETIUM TC 99M MEDRONATE IV KIT
20.0000 | PACK | Freq: Once | INTRAVENOUS | Status: AC | PRN
  Administered 2019-08-17: 21.2 via INTRAVENOUS

## 2019-08-18 ENCOUNTER — Ambulatory Visit (HOSPITAL_COMMUNITY)
Admission: RE | Admit: 2019-08-18 | Discharge: 2019-08-18 | Disposition: A | Discharge status: Home / Self Care | Payer: Medicare Other | Source: Ambulatory Visit | Attending: Urology | Admitting: Urology

## 2019-08-18 DIAGNOSIS — D3500 Benign neoplasm of unspecified adrenal gland: Secondary | ICD-10-CM | POA: Diagnosis not present

## 2019-08-18 DIAGNOSIS — K5730 Diverticulosis of large intestine without perforation or abscess without bleeding: Secondary | ICD-10-CM | POA: Diagnosis not present

## 2019-08-18 DIAGNOSIS — E278 Other specified disorders of adrenal gland: Secondary | ICD-10-CM | POA: Diagnosis not present

## 2019-08-18 DIAGNOSIS — C61 Malignant neoplasm of prostate: Secondary | ICD-10-CM

## 2019-08-18 DIAGNOSIS — I700 Atherosclerosis of aorta: Secondary | ICD-10-CM | POA: Diagnosis not present

## 2019-08-18 MED ORDER — IOHEXOL 300 MG/ML  SOLN
100.0000 mL | Freq: Once | INTRAMUSCULAR | Status: AC | PRN
  Administered 2019-08-18: 100 mL via INTRAVENOUS

## 2019-08-19 ENCOUNTER — Encounter: Payer: Self-pay | Admitting: Medical Oncology

## 2019-08-19 NOTE — Progress Notes (Signed)
Bryan Hamilton-daughter called requesting results of CT and bone scan. Per Stacy, PA, the findings on the bone scan are most likely degenerative and there are no correlating lesions noted on the CT.  There are  no other findings to suggest metastatic disease. We do not see any definite evidence of metastatic disease and there are a few spots that showed up on bone scan that look like arthritis but we will have Dr. Alyson Ingles review them as well and call with any further recommendations.He has follow up appointment with Dr. Alyson Ingles 8/4. She voiced understanding and was very appreciative of the call.

## 2019-08-29 ENCOUNTER — Other Ambulatory Visit: Payer: Self-pay

## 2019-08-29 ENCOUNTER — Telehealth: Payer: Medicare Other | Admitting: Urology

## 2019-09-05 ENCOUNTER — Ambulatory Visit: Payer: Medicare Other | Admitting: Cardiovascular Disease

## 2019-09-07 ENCOUNTER — Ambulatory Visit: Payer: Medicare Other | Admitting: Cardiology

## 2019-09-10 NOTE — Progress Notes (Signed)
Cardiology Office Note   Date:  09/13/2019   ID:  Bryan Hamilton, DOB 01-19-1946, MRN [id number]  PCP:  Celene Squibb, MD    No chief complaint on file.  AFib  Wt Readings from Last 3 Encounters:  09/13/19 212 lb 6.4 oz (96.3 kg)  08/01/19 195 lb (88.5 kg)  07/27/19 195 lb (88.5 kg)       History of Present Illness: Bryan Hamilton is a 74 y.o. male  Who was previously seen by Dr. Bronson Ing for AFib.  No h/o CAD.  Records showed: "The patient presents for posthospitalization follow-up for rapid atrial fibrillation.  I consulted on him on 05/13/2019.  He is here with his daughter, Verdis Frederickson, who would also like to be contacted with any information regarding her father's health [phone]). She plans to move here from [facility] to help take care of her father. He has another daughter who lives in [place] with her family.  That daughter has atrial fibrillation and recently underwent cardioversion."  He has chronic SHOB from COPD.  He was started on ELiquis in 4/21.  Rate control with metoprolol 50 mg BID and Cardizem 240 mg CD.  HR was 108 on this regimen in May 2021 so Cardizem was increased to 300 mg daily.   Echo in 2021 April showed: "Left ventricular ejection fraction, by estimation, is 60 to 65%. The  left ventricle has normal function. Left ventricular endocardial border  not optimally defined to evaluate regional wall motion. There is mild left  ventricular hypertrophy. Left  ventricular diastolic parameters are indeterminate.  2. Right ventricular systolic function is normal. The right ventricular  size is mildly enlarged. There is moderately elevated pulmonary artery  systolic pressure.  3. Left atrial size was mildly dilated.  4. The mitral valve is normal in structure. No evidence of mitral valve  regurgitation. No evidence of mitral stenosis.  5. The aortic valve is tricuspid. Aortic valve regurgitation is not  visualized. No aortic stenosis is  present.  6. The inferior vena cava is normal in size with greater than 50%  respiratory variability, suggesting right atrial pressure of 3 mmHg. "  Since the last visit, he has done well.   Denies : Chest pain. Dizziness. Leg edema. Nitroglycerin use. Orthopnea. Palpitations. Paroxysmal nocturnal dyspnea. Shortness of breath. Syncope.   No bleeding issues.  Uses a cane.    Past Medical History:  Diagnosis Date  . Anxiety   . Arrhythmia   . Arthritis   . Asthma   . Atrial fibrillation ([place])   . COPD (chronic obstructive pulmonary disease) ([place])   . Depression   . Diabetes mellitus without complication ([place])   . GERD (gastroesophageal reflux disease)   . Hyperchloremia   . Hypertension   . Prostate cancer [facility])     Past Surgical History:  Procedure Laterality Date  . BACK SURGERY    . PROSTATE BIOPSY N/A 07/20/2019   Procedure: PROSTATE BIOPSY;  Surgeon: Cleon Gustin, MD;  Location: [facility];  Service: Urology;  Laterality: N/A;     Current Outpatient Medications  Medication Sig Dispense Refill  . acetaminophen (TYLENOL) 650 MG CR tablet Take 1,300 mg by mouth 2 (two) times daily as needed for pain.    Marland Kitchen ALPRAZolam (XANAX) 0.25 MG tablet Take 0.25 mg by mouth daily as needed for anxiety.     Marland Kitchen apixaban (ELIQUIS) 5 MG TABS tablet Take 1 tablet (5 mg total) by mouth 2 (two)  times daily. 60 tablet 11  . atorvastatin (LIPITOR) 20 MG tablet Take 20 mg by mouth at bedtime.     . diclofenac Sodium (VOLTAREN) 1 % GEL Apply 2 g topically daily as needed (Pain).    Marland Kitchen diltiazem (CARDIZEM CD) 300 MG 24 hr capsule Take 1 capsule (300 mg total) by mouth daily. 90 capsule 3  . methocarbamol (ROBAXIN) 500 MG tablet Take 1 tablet (500 mg total) by mouth every 8 (eight) hours as needed for muscle spasms. 15 tablet 0  . metoprolol tartrate (LOPRESSOR) 50 MG tablet Take 50 mg by mouth 2 (two) times daily.    . Omega-3 Fatty Acids (OMEGA 3 PO) Take 6 capsules by mouth daily.    Vladimir Faster Glycol-Propyl Glycol (SYSTANE) 0.4-0.3 % SOLN Place 1-2 drops into both eyes daily as needed (Dry eye).     Marland Kitchen PROAIR HFA 108 (90 Base) MCG/ACT inhaler Inhale 2 puffs into the lungs every 6 (six) hours as needed for wheezing or shortness of breath.     . sertraline (ZOLOFT) 25 MG tablet Take 25 mg by mouth at bedtime.     . traMADol (ULTRAM) 50 MG tablet Take 1 tablet (50 mg total) by mouth every 6 (six) hours as needed for moderate pain. 12 tablet 0  . TRELEGY ELLIPTA 100-62.5-25 MCG/INH AEPB Inhale 1 puff into the lungs daily.     No current facility-administered medications for this visit.    Allergies:   Patient has no known allergies.    Social History:  The patient  reports that he quit smoking about 11 years ago. His smoking use included cigarettes. He has a 80.00 pack-year smoking history. He has never used smokeless tobacco. He reports current alcohol use. He reports that he does not use drugs.   Family History:  The patient's family history includes Cancer in his father. No early CAD   ROS:  Please see the history of present illness.   Otherwise, review of systems are positive for leg swelling.   All other systems are reviewed and negative.    PHYSICAL EXAM: VS:  BP (!) 104/52   Pulse 65   Ht 5\' 8"  (1.727 m)   Wt 212 lb 6.4 oz (96.3 kg)   SpO2 93%   BMI 32.30 kg/m  , BMI Body mass index is 32.3 kg/m. GEN: Well nourished, well developed, in no acute distress  HEENT: normal  Neck: no JVD, carotid bruits, or masses Cardiac: irregularly irregular, normal rate; no murmurs, rubs, or gallops,no edema  Respiratory:  clear to auscultation bilaterally, normal work of breathing GI: soft, nontender, nondistended, + BS MS: no deformity or atrophy  Skin: warm and dry, no rash Neuro:  Strength and sensation are intact Psych: euthymic mood, full affect   EKG:   The ekg ordered 7/1 demonstrates AFib, controlled rate   Recent Labs: 05/12/2019: Magnesium 2.0; TSH  1.764 07/21/2019: ALT 18; BUN 21; Creatinine, Ser 1.23; Hemoglobin 12.6; Platelets 304; Potassium 4.5; Sodium 133   Lipid Panel No results found for: CHOL, TRIG, HDL, CHOLHDL, VLDL, LDLCALC, LDLDIRECT   Other studies Reviewed: Additional studies/ records that were reviewed today with results demonstrating: prior echo result reviewed.   ASSESSMENT AND PLAN:  1. AFib with rapid ventricular response: No sx.  No bleeding issues. Continue rate control meds. Eliquis for stroke prevention.  Avoid falls.  No bleeding issues.  2. HTN: The current medical regimen is effective;  continue present plan and medications.  Home readings discussed.  3. Chronic diastolic heart failure: LVH on echo.  Lasix 40 mg daily as needed.  Elevate legs to help with swelling.  Can use compression stockings.  Daily weights as well.  >ow salt diet.   4. Hyperlipidemia: TC 125 in 2020.   5. COPD: Continue Inhalers.  Stable.    Current medicines are reviewed at length with the patient today.  The patient concerns regarding his medicines were addressed.  The following changes have been made:  Start Lasix 40 mg daily as needed; he will start with 1-2 x/week, eat potassium rich foods when he takes the Lasix.    Labs/ tests ordered today include: BMet to be checked with PMD in a month No orders of the defined types were placed in this encounter.   Recommend 150 minutes/week of aerobic exercise Low fat, low carb, high fiber diet recommended  Disposition:   FU in 6 months   Signed, Larae Grooms, MD  09/13/2019 12:22 PM    [facility] [address], [place], [place]  [address] Phone: [phone]; Fax: [phone]

## 2019-09-13 ENCOUNTER — Encounter: Payer: Self-pay | Admitting: Interventional Cardiology

## 2019-09-13 ENCOUNTER — Ambulatory Visit: Payer: Medicare Other | Admitting: Interventional Cardiology

## 2019-09-13 ENCOUNTER — Other Ambulatory Visit: Payer: Self-pay

## 2019-09-13 VITALS — BP 104/52 | HR 65 | Ht 68.0 in | Wt 212.4 lb

## 2019-09-13 DIAGNOSIS — I5032 Chronic diastolic (congestive) heart failure: Secondary | ICD-10-CM

## 2019-09-13 DIAGNOSIS — I10 Essential (primary) hypertension: Secondary | ICD-10-CM

## 2019-09-13 DIAGNOSIS — I4891 Unspecified atrial fibrillation: Secondary | ICD-10-CM | POA: Diagnosis not present

## 2019-09-13 DIAGNOSIS — E782 Mixed hyperlipidemia: Secondary | ICD-10-CM | POA: Diagnosis not present

## 2019-09-13 DIAGNOSIS — J449 Chronic obstructive pulmonary disease, unspecified: Secondary | ICD-10-CM

## 2019-09-13 MED ORDER — FUROSEMIDE 40 MG PO TABS: 40.0000 mg | ORAL_TABLET | Freq: Every day | ORAL | 3 refills | Status: DC | PRN

## 2019-09-13 NOTE — Patient Instructions (Signed)
Medication Instructions:  Your physician has recommended you make the following change in your medication:   START: furosemide (lasix) 40 mg tablet: Take 1 tablet by mouth once a day AS NEEDED for swelling. Eat potassium rich-foods on day that you take the lasix.  *If you need a refill on your cardiac medications before your next appointment, please call your pharmacy*   Lab Work: None  If you have labs (blood work) drawn today and your tests are completely normal, you will receive your results only by: Marland Kitchen MyChart Message (if you have MyChart) OR . A paper copy in the mail If you have any lab test that is abnormal or we need to change your treatment, we will call you to review the results.   Testing/Procedures: None   Follow-Up: At [facility], you and your health needs are our priority.  As part of our continuing mission to provide you with exceptional heart care, we have created designated Provider Care Teams.  These Care Teams include your primary Cardiologist (physician) and Advanced Practice Providers (APPs -  Physician Assistants and Nurse Practitioners) who all work together to provide you with the care you need, when you need it.  We recommend signing up for the patient portal called "MyChart".  Sign up information is provided on this After Visit Summary.  MyChart is used to connect with patients for Virtual Visits (Telemedicine).  Patients are able to view lab/test results, encounter notes, upcoming appointments, etc.  Non-urgent messages can be sent to your provider as well.   To learn more about what you can do with MyChart, go to [email or web address].    Your next appointment:   6 month(s)  The format for your next appointment:   In Person  Provider:   You may see Larae Grooms, MD or one of the following Advanced Practice Providers on your designated Care Team:    Melina Copa, PA-C  Ermalinda Barrios, PA-C    Other Instructions None

## 2019-09-14 ENCOUNTER — Telehealth: Payer: Self-pay | Admitting: *Deleted

## 2019-09-14 NOTE — Telephone Encounter (Signed)
CALLED PATIENT'S DAUGHTER - MARIA HUDSON TO REMIND OF PRE-SEED APPTS. FOR HER DAD ON 09-15-19, SPOKE WITH PATIENT'S DAUGHTER AND SHE IS  AWARE OF THESE APPTS.

## 2019-09-15 ENCOUNTER — Encounter: Payer: Self-pay | Admitting: Medical Oncology

## 2019-09-15 ENCOUNTER — Encounter (HOSPITAL_COMMUNITY)
Admission: RE | Admit: 2019-09-15 | Discharge: 2019-09-15 | Disposition: A | Discharge status: Home / Self Care | Payer: Medicare Other | Source: Ambulatory Visit | Attending: Urology | Admitting: Urology

## 2019-09-15 ENCOUNTER — Ambulatory Visit
Admission: RE | Admit: 2019-09-15 | Discharge: 2019-09-15 | Disposition: A | Discharge status: Home / Self Care | Payer: Medicare Other | Source: Ambulatory Visit | Attending: Radiation Oncology | Admitting: Radiation Oncology

## 2019-09-15 ENCOUNTER — Ambulatory Visit (HOSPITAL_COMMUNITY)
Admission: RE | Admit: 2019-09-15 | Discharge: 2019-09-15 | Disposition: A | Discharge status: Home / Self Care | Payer: Medicare Other | Source: Ambulatory Visit | Attending: Urology | Admitting: Urology

## 2019-09-15 ENCOUNTER — Ambulatory Visit
Admission: RE | Admit: 2019-09-15 | Discharge: 2019-09-15 | Disposition: A | Discharge status: Home / Self Care | Payer: Medicare Other | Source: Ambulatory Visit | Attending: Urology | Admitting: Urology

## 2019-09-15 ENCOUNTER — Other Ambulatory Visit: Payer: Self-pay

## 2019-09-15 DIAGNOSIS — Z01818 Encounter for other preprocedural examination: Secondary | ICD-10-CM

## 2019-09-15 DIAGNOSIS — J9811 Atelectasis: Secondary | ICD-10-CM | POA: Diagnosis not present

## 2019-09-15 DIAGNOSIS — C61 Malignant neoplasm of prostate: Secondary | ICD-10-CM | POA: Diagnosis present

## 2019-09-15 DIAGNOSIS — J90 Pleural effusion, not elsewhere classified: Secondary | ICD-10-CM | POA: Diagnosis not present

## 2019-09-15 NOTE — Progress Notes (Signed)
  Radiation Oncology         [phone] ________________________________  Name: KALUM MINNER MRN: [id number]  Date: 09/15/2019  DOB: 1945/04/13  SIMULATION AND TREATMENT PLANNING NOTE PUBIC ARCH STUDY  EH:MCNO, Edwinna Areola, MD  Alyson Ingles Candee Furbish, MD  DIAGNOSIS:  Oncology History   No history exists.      ICD-10-CM   1. Prostate cancer ([place])  C61     COMPLEX SIMULATION:  The patient presented today for evaluation for possible prostate seed implant. He was brought to the radiation planning suite and placed supine on the CT couch. A 3-dimensional image study set was obtained in upload to the planning computer. There, on each axial slice, I contoured the prostate gland. Then, using three-dimensional radiation planning tools I reconstructed the prostate in view of the structures from the transperineal needle pathway to assess for possible pubic arch interference. In doing so, I did not appreciate any pubic arch interference. Also, the patient's prostate volume was estimated based on the drawn structure. The volume was 23 cc.  Given the pubic arch appearance and prostate volume, patient remains a good candidate to proceed with prostate seed implant. Today, he freely provided informed written consent to proceed.    PLAN: The patient will undergo prostate seed implant.   ________________________________  Sheral Apley. Tammi Klippel, M.D.

## 2019-09-16 NOTE — Telephone Encounter (Signed)
   Primary Cardiologist: Larae Grooms, MD  Chart reviewed as part of pre-operative protocol coverage. Given past medical history and time since last visit, based on ACC/AHA guidelines, Bryan Hamilton would be at acceptable risk for the planned procedure without further cardiovascular testing.   I have spoken with the patient and per pharmacy recommendations he is to hold Eliquis on 10/12/2019 and 10/13/2019 (two days prior the the procedure) and begin again ASAP after procedure is completed.   I will route this recommendation to the requesting party via Epic fax function and remove from pre-op pool.  Please call with questions.  [name]. Lowry Bala DNP, ANP, AACC  09/16/2019, 10:30 AM

## 2019-09-16 NOTE — Progress Notes (Signed)
Left voice mail message for Bryan Hamilton pt has small right pleural effusion on chest xray done 09-15-2019 please let dr Alyson Ingles know.

## 2019-09-19 ENCOUNTER — Telehealth: Payer: Self-pay

## 2019-09-19 ENCOUNTER — Encounter: Payer: Self-pay | Admitting: Medical Oncology

## 2019-09-19 NOTE — Progress Notes (Signed)
Bryan Hamilton called stating someone called her father last week with information regarding his seed implant surgery. He did not write the number down correctly and she is not sure who to call. I spoke with [facility] outpatient surgery and they are not sure who contacted patient but did ask me to have daughter call Dr. Noland Fordyce office regarding chest x-ray results. I informed her they see a small pleural effusion. She will call Dr. Alyson Ingles and patient's primary care MD to discuss.

## 2019-09-22 NOTE — Telephone Encounter (Signed)
It should not effect brachytherapy

## 2019-09-22 NOTE — Telephone Encounter (Signed)
Pts drt notified.

## 2019-10-03 ENCOUNTER — Encounter (HOSPITAL_BASED_OUTPATIENT_CLINIC_OR_DEPARTMENT_OTHER): Payer: Self-pay | Admitting: Urology

## 2019-10-03 ENCOUNTER — Other Ambulatory Visit: Payer: Self-pay

## 2019-10-03 NOTE — Progress Notes (Signed)
Spoke w/ via phone for pre-op interview--- Lab needs dos-none              Lab results- CBC, CMP, pt,ptt hgba1c- to be done 10/11/19 at 1100am COVID test - 10/11/19 at 1300 Arrive at -0530 NPO after MN NO Solid Food.  Clear liquids from MN until-0430am Medications to take morning of surgery cardizem, metoprolol, inhalers as usual and bring , xanax if needed, no diabetic meds am of surgery  Diabetic medication - Metformin  Patient Special Instructions ----- Pre-Op special Istructions daughter and pt aware Eliquis - to stop 2 days prior to surgery Daughter to call office in regards to vitamins and supplements prior to surgery  Patient verbalized understanding of instructions that were given at this phone interview. Also spoke with daughter Leanord Hawking.  Patient denies shortness of breath, chest pain, fever, cough at this phone interview.

## 2019-10-06 NOTE — Progress Notes (Signed)
Called and reviewed am meds of surgery and left voicemail message with daughter Verdis Frederickson

## 2019-10-10 ENCOUNTER — Telehealth: Payer: Self-pay | Admitting: *Deleted

## 2019-10-10 NOTE — Telephone Encounter (Signed)
Called patient to remind of lab and Covid testing for 10-11-19, spoke with patient's daughter- Reginold Agent and she is aware of these appts.

## 2019-10-11 ENCOUNTER — Encounter (HOSPITAL_COMMUNITY)
Admission: RE | Admit: 2019-10-11 | Discharge: 2019-10-11 | Disposition: A | Discharge status: Home / Self Care | Payer: Medicare Other | Source: Ambulatory Visit | Attending: Urology | Admitting: Urology

## 2019-10-11 ENCOUNTER — Other Ambulatory Visit (HOSPITAL_COMMUNITY)
Admission: RE | Admit: 2019-10-11 | Discharge: 2019-10-11 | Disposition: A | Discharge status: Home / Self Care | Payer: Medicare Other | Source: Ambulatory Visit | Attending: Urology | Admitting: Urology

## 2019-10-11 ENCOUNTER — Other Ambulatory Visit: Payer: Self-pay

## 2019-10-11 DIAGNOSIS — Z20822 Contact with and (suspected) exposure to covid-19: Secondary | ICD-10-CM | POA: Insufficient documentation

## 2019-10-11 DIAGNOSIS — Z01812 Encounter for preprocedural laboratory examination: Secondary | ICD-10-CM | POA: Insufficient documentation

## 2019-10-11 LAB — CBC
HCT: 41 % (ref 39.0–52.0)
Hemoglobin: 13.2 g/dL (ref 13.0–17.0)
MCH: 30 pg (ref 26.0–34.0)
MCHC: 32.2 g/dL (ref 30.0–36.0)
MCV: 93.2 fL (ref 80.0–100.0)
Platelets: 276 10*3/uL (ref 150–400)
RBC: 4.4 MIL/uL (ref 4.22–5.81)
RDW: 14.4 % (ref 11.5–15.5)
WBC: 7.9 10*3/uL (ref 4.0–10.5)
nRBC: 0 % (ref 0.0–0.2)

## 2019-10-11 LAB — COMPREHENSIVE METABOLIC PANEL
ALT: 15 U/L (ref 0–44)
AST: 14 U/L — ABNORMAL LOW (ref 15–41)
Albumin: 3.8 g/dL (ref 3.5–5.0)
Alkaline Phosphatase: 87 U/L (ref 38–126)
Anion gap: 10 (ref 5–15)
BUN: 19 mg/dL (ref 8–23)
CO2: 29 mmol/L (ref 22–32)
Calcium: 9.7 mg/dL (ref 8.9–10.3)
Chloride: 96 mmol/L — ABNORMAL LOW (ref 98–111)
Creatinine, Ser: 1.06 mg/dL (ref 0.61–1.24)
GFR calc Af Amer: >60 mL/min (ref >60)
GFR calc non Af Amer: >60 mL/min (ref >60)
Glucose, Bld: 103 mg/dL — ABNORMAL HIGH (ref 70–99)
Potassium: 4.9 mmol/L (ref 3.5–5.1)
Sodium: 135 mmol/L (ref 135–145)
Total Bilirubin: 0.8 mg/dL (ref 0.3–1.2)
Total Protein: 6.9 g/dL (ref 6.5–8.1)

## 2019-10-11 LAB — PROTIME-INR
INR: 1.6 — ABNORMAL HIGH (ref 0.8–1.2)
Prothrombin Time: 18.3 seconds — ABNORMAL HIGH (ref 11.4–15.2)

## 2019-10-11 LAB — APTT: aPTT: 45 seconds — ABNORMAL HIGH (ref 24–36)

## 2019-10-11 LAB — SARS CORONAVIRUS 2 (TAT 6-24 HRS): SARS Coronavirus 2: NEGATIVE

## 2019-10-13 ENCOUNTER — Telehealth: Payer: Self-pay | Admitting: *Deleted

## 2019-10-13 ENCOUNTER — Ambulatory Visit (INDEPENDENT_AMBULATORY_CARE_PROVIDER_SITE_OTHER): Payer: Medicare Other | Admitting: Urology

## 2019-10-13 ENCOUNTER — Other Ambulatory Visit: Payer: Self-pay

## 2019-10-13 ENCOUNTER — Encounter: Payer: Self-pay | Admitting: Urology

## 2019-10-13 VITALS — BP 142/66 | HR 120 | Temp 97.5°F | Ht 68.0 in | Wt 195.0 lb

## 2019-10-13 DIAGNOSIS — C61 Malignant neoplasm of prostate: Secondary | ICD-10-CM

## 2019-10-13 NOTE — Progress Notes (Signed)
10/13/2019 11:02 AM   MILOH ALCOCER 1945-05-06 [id number]  Referring provider: Celene Squibb, MD [address],  [place] [address]  Followup prostate cancer  HPI: Mr Bryan Hamilton is a 74yo here for followup for prostate cancer. He is scheduled for brachytherapy tomorrow. No significant LUTS. He stopped his Eliquis yesterday. No new medical problems.    PMH: Past Medical History:  Diagnosis Date  . Anxiety   . Arrhythmia   . Arthritis   . Asthma   . Atrial fibrillation ([place])   . COPD (chronic obstructive pulmonary disease) ([place])   . Depression   . Diabetes mellitus without complication ([place])    tpe 2   . Dyspnea    with exertion  and iwth humidity   . Dysrhythmia    afib   . Edema    lower extremities   . GERD (gastroesophageal reflux disease)   . Hyperchloremia   . Hypertension   . PONV (postoperative nausea and vomiting)   . Prostate cancer [facility])     Surgical History: Past Surgical History:  Procedure Laterality Date  . BACK SURGERY    . PROSTATE BIOPSY N/A 07/20/2019   Procedure: PROSTATE BIOPSY;  Surgeon: Cleon Gustin, MD;  Location: [facility] ORS;  Service: Urology;  Laterality: N/A;    Home Medications:  Allergies as of 10/13/2019   No Known Allergies     Medication List       Accurate as of October 13, 2019 11:02 AM. If you have any questions, ask your nurse or doctor.        acetaminophen 650 MG CR tablet Commonly known as: TYLENOL Take 1,300 mg by mouth 2 (two) times daily as needed for pain.   ALPRAZolam 0.25 MG tablet Commonly known as: XANAX Take 0.25 mg by mouth daily as needed for anxiety.   apixaban 5 MG Tabs tablet Commonly known as: ELIQUIS Take 1 tablet (5 mg total) by mouth 2 (two) times daily.   atorvastatin 20 MG tablet Commonly known as: LIPITOR Take 20 mg by mouth at bedtime.   diclofenac Sodium 1 % Gel Commonly known as: VOLTAREN Apply 2 g topically daily as needed (Pain).   diltiazem 300 MG 24 hr  capsule Commonly known as: Cardizem CD Take 1 capsule (300 mg total) by mouth daily.   furosemide 40 MG tablet Commonly known as: LASIX Take 1 tablet (40 mg total) by mouth daily as needed for edema.   metFORMIN 500 MG tablet Commonly known as: GLUCOPHAGE Take by mouth daily. Pt takes at 2000 pm nightly   metoprolol tartrate 50 MG tablet Commonly known as: LOPRESSOR Take 50 mg by mouth 2 (two) times daily.   OMEGA 3 PO Take 4 capsules by mouth daily. Last dose on 09/29/19   ProAir HFA 108 (90 Base) MCG/ACT inhaler Generic drug: albuterol Inhale 2 puffs into the lungs every 6 (six) hours as needed for wheezing or shortness of breath.   sertraline 25 MG tablet Commonly known as: ZOLOFT Take 25 mg by mouth at bedtime.   Systane 0.4-0.3 % Soln Generic drug: Polyethyl Glycol-Propyl Glycol Place 1-2 drops into both eyes daily as needed (Dry eye).   traMADol 50 MG tablet Commonly known as: ULTRAM Take 1 tablet (50 mg total) by mouth every 6 (six) hours as needed for moderate pain.   Trelegy Ellipta 100-62.5-25 MCG/INH Aepb Generic drug: Fluticasone-Umeclidin-Vilant Inhale 1 puff into the lungs daily.       Allergies: No Known  Allergies  Family History: Family History  Problem Relation Age of Onset  . Cancer Father        type unknown  . Breast cancer Neg Hx   . Prostate cancer Neg Hx   . Colon cancer Neg Hx   . Pancreatic cancer Neg Hx     Social History:  reports that he quit smoking about 11 years ago. His smoking use included cigarettes. He has a 80.00 pack-year smoking history. He has never used smokeless tobacco. He reports current alcohol use. He reports that he does not use drugs.  ROS: All other review of systems were reviewed and are negative except what is noted above in HPI  Physical Exam: BP (!) 142/66   Pulse (!) 120   Temp (!) 97.5 F (36.4 C)   Ht 5\' 8"  (1.727 m)   Wt 195 lb (88.5 kg)   BMI 29.65 kg/m   Constitutional:  Alert and oriented, No  acute distress. HEENT: [place] AT, moist mucus membranes.  Trachea midline, no masses. Cardiovascular: No clubbing, cyanosis, or edema. Respiratory: Normal respiratory effort, no increased work of breathing. GI: Abdomen is soft, nontender, nondistended, no abdominal masses GU: No CVA tenderness.  Lymph: No cervical or inguinal lymphadenopathy. Skin: No rashes, bruises or suspicious lesions. Neurologic: Grossly intact, no focal deficits, moving all 4 extremities. Psychiatric: Normal mood and affect.  Laboratory Data: Lab Results  Component Value Date   WBC 7.9 10/11/2019   HGB 13.2 10/11/2019   HCT 41.0 10/11/2019   MCV 93.2 10/11/2019   PLT 276 10/11/2019    Lab Results  Component Value Date   CREATININE 1.06 10/11/2019    No results found for: PSA  No results found for: TESTOSTERONE  No results found for: HGBA1C  Urinalysis No results found for: COLORURINE, APPEARANCEUR, LABSPEC, PHURINE, GLUCOSEU, HGBUR, BILIRUBINUR, KETONESUR, PROTEINUR, UROBILINOGEN, NITRITE, LEUKOCYTESUR  No results found for: LABMICR, [place], RBCUA, LABEPIT, MUCUS, BACTERIA  Pertinent Imaging:  No results found for this or any previous visit.  No results found for this or any previous visit.  No results found for this or any previous visit.  No results found for this or any previous visit.  No results found for this or any previous visit.  No results found for this or any previous visit.  No results found for this or any previous visit.  No results found for this or any previous visit.   Assessment & Plan:    1. Prostate cancer [facility]) -Patient scheduled for brachytherapy tomorrow. He will followup Wednesday for a voiding trial.  - Urinalysis, Routine w reflex microscopic   No follow-ups on file.  Nicolette Bang, MD  [facility] [place]

## 2019-10-13 NOTE — Progress Notes (Signed)

## 2019-10-13 NOTE — Telephone Encounter (Signed)
Called patient to remind of procedure for 10-14-19, spoke with patient and he is aware of this procedure

## 2019-10-13 NOTE — Patient Instructions (Signed)
Brachytherapy for Prostate Cancer Brachytherapy for prostate cancer is radiation treatment that is placed inside of the prostate (prostate gland). There are several types of brachytherapy:  Low-dose rate (LDR) therapy. This may involve temporary implants or permanent radioactive seed or pellet implants. The radiation does not travel far from the prostate, which means that healthy, noncancerous tissues around the prostate receive only a small dose of radiation. This helps to protect those tissues from injury. This type of treatment may be followed by a course of external beam radiation. ? Temporary low-dose implants are left in the prostate for 1-7 days. The implants are needles, applicators, or thin, plastic tubes (catheters) that contain radioactive material. You will need to stay in the hospital while the implant is in place. ? Permanent low-dose implants (seeds or pellets) are injected into the prostate, and they work for up to one year after they are inserted. They are left in place and are not removed.  High-dose rate (HDR) therapy. This is given through needles, applicators, or catheters that contain radioactive material. The tubes are removed after treatment, and no radiation is left in the prostate. This type of treatment may be followed by a course of external beam radiation. Tell a health care provider about:  Any allergies you have.  All medicines you are taking, including vitamins, herbs, eye drops, creams, and over-the-counter medicines.  Any problems you or family members have had with anesthetic medicines.  Any surgeries you have had.  Any blood disorders you have.  Any medical conditions you have. What are the risks? Generally, this is a safe procedure. However, problems may occur, including:  Inflammation of the rectum.  Problems getting or keeping an erection (erectile dysfunction).  Trouble urinating.  Diarrhea.  Bleeding.  Loss of bowel control. What happens  before the procedure? Staying hydrated Follow instructions from your health care provider about hydration, which may include:  Up to 2 hours before the procedure - you may continue to drink clear liquids, such as water, clear fruit juice, black coffee, and plain tea. Eating and drinking Follow instructions from your health care provider about eating and drinking, which may include:  8 hours before the procedure - stop eating heavy meals or foods such as meat, fried foods, or fatty foods.  6 hours before the procedure - stop eating light meals or foods, such as toast or cereal.  6 hours before the procedure - stop drinking milk or drinks that contain milk.  2 hours before the procedure - stop drinking clear liquids. Medicines  Ask your health care provider about: ? Changing or stopping your regular medicines. This is especially important if you are taking diabetes medicines or blood thinners. ? Taking medicines such as aspirin and ibuprofen. These medicines can thin your blood. Do not take these medicines before your procedure if your health care provider instructs you not to.  You may be given antibiotic medicine to help prevent infection. General instructions  Plan to have someone take you home from the hospital or clinic.  If you will be going home right after the procedure, plan to have someone with you for 24 hours.  You may have imaging tests done, including an ultrasound, CT scan, or MRI.  You may have blood tests done.  You may have a test to check the electrical signals in your heart (electrocardiogram).  You may need to take medicine to clean out your bowel (bowel prep). What happens during the procedure?  To lower your risk of   infection: ? Your health care team will wash or sanitize their hands. ? Your skin will be washed with soap. ? Hair may be removed from the surgical area.  An IV will be inserted into one of your veins.  You will be given one or more of the  following: ? A medicine to help you relax (sedative). ? A medicine to numb the area (local anesthetic). ? A medicine to make you fall asleep (general anesthetic).  You may have a thin, plastic tube (catheter) inserted to drain your bladder.  If you are receiving brachytherapy with implants: ? A needle, applicator, or catheter will be inserted into the prostate. It will be inserted through a body cavity, such as the rectum, or through the tissue between the testicles and the anus (perineum). ? An X-ray, ultrasound, MRI, or CT scan will be used to guide the catheter or applicator toward the prostate. ? Radioactive seeds, wires, or ribbons will be fed through the catheter or applicator. ? If the high-dose method is used:  The radioactive wires or ribbons will be left in for a few minutes and then removed.  Once the treatment is finished, the catheter or applicator will be removed. ? If the low-dose method is used, the implant will stay in place for 1-7 days.  You will remain in the hospital while the implant is in place.  Once the treatment is finished, the radioactive material and catheter will be removed.  If you are receiving permanent, low-dose brachytherapy: ? Small, radioactive seeds or pellets will be injected into your prostate. This may be done through a catheter, needle, or applicator. ? The catheter or applicator will be removed, leaving the seeds in the prostate. The procedure may vary among health care providers and hospitals. What happens after the procedure?  Your blood pressure, heart rate, breathing rate, and blood oxygen level will be monitored until the medicines you were given have worn off.  Do not drive for 24 hours if you were given a sedative. Summary  Brachytherapy for prostate cancer is radiation treatment placed inside of the prostate (prostate gland).  There are several types of brachytherapy for prostate cancer, including low-dose temporary treatment,  low-dose permanent treatment, and high-dose temporary treatment.  Temporary low-dose implants are left in the prostate for 1-7 days.  Permanent low-dose implants are injected into the prostate and left in place. They work for up to one year after they are inserted.  Permanent high-dose therapy is given through tubes that contain radioactive material. The tubes are removed after treatment, and no radiation is left in the prostate. This information is not intended to replace advice given to you by your health care provider. Make sure you discuss any questions you have with your health care provider. Document Revised: 12/19/2016 Document Reviewed: 01/16/2016 Elsevier Patient Education  2020 [employer]  

## 2019-10-13 NOTE — Anesthesia Preprocedure Evaluation (Addendum)
Anesthesia Evaluation  Patient identified by MRN, date of birth, ID band Patient awake    Reviewed: Allergy & Precautions, H&P , NPO status , Patient's Chart, lab work & pertinent test results, reviewed documented beta blocker date and time   History of Anesthesia Complications (+) PONV and history of anesthetic complications  Airway Mallampati: II  TM Distance: >3 FB Neck ROM: Full    Dental  (+) Dental Advisory Given, Teeth Intact, Caps   Pulmonary shortness of breath, asthma , COPD,  COPD inhaler, former smoker,    Pulmonary exam normal breath sounds clear to auscultation       Cardiovascular Exercise Tolerance: Good hypertension, Pt. on medications and Pt. on home beta blockers + dysrhythmias  Rhythm:Irregular Rate:Normal  Echo 04/2019 1. Left ventricular ejection fraction, by estimation, is 60 to 65%. The left ventricle has normal function. The left ventricle has no regional wall motion abnormalities. Left ventricular diastolic parameters were normal.  2. Right ventricular systolic function is normal. The right ventricular size is normal.  3. The mitral valve is normal in structure. No evidence of mitral valve regurgitation. No evidence of mitral stenosis.  4. The aortic valve is normal in structure. Aortic valve regurgitation is not visualized. No aortic stenosis is present.  5. The inferior vena cava is normal in size with greater than 50% respiratory variability, suggesting right atrial pressure of 3 mmHg   Neuro/Psych PSYCHIATRIC DISORDERS Anxiety Depression negative neurological ROS     GI/Hepatic Neg liver ROS, GERD  Medicated,  Endo/Other  negative endocrine ROSdiabetes  Renal/GU negative Renal ROS     Musculoskeletal  (+) Arthritis ,   Abdominal   Peds  Hematology negative hematology ROS (+)   Anesthesia Other Findings   Reproductive/Obstetrics                           Anesthesia Physical  Anesthesia Plan  ASA: III  Anesthesia Plan: General   Post-op Pain Management:    Induction:   PONV Risk Score and Plan: 4 or greater and Treatment may vary due to age or medical condition, Dexamethasone and Ondansetron  Airway Management Planned: LMA and Oral ETT  Additional Equipment: None  Intra-op Plan:   Post-operative Plan: Extubation in OR  Informed Consent: I have reviewed the patients History and Physical, chart, labs and discussed the procedure including the risks, benefits and alternatives for the proposed anesthesia with the patient or authorized representative who has indicated his/her understanding and acceptance.     Dental advisory given  Plan Discussed with: CRNA  Anesthesia Plan Comments:      Anesthesia Quick Evaluation

## 2019-10-13 NOTE — H&P (View-Only) (Signed)
**Note Bryan-Identified via Obfuscation** 10/13/2019 11:02 AM   Bryan Hamilton December 18, 1945 [id number]  Referring provider: Celene Squibb, MD [address],  [place] [address]  Followup prostate cancer  HPI: Mr Bryan Hamilton is a 74yo here for followup for prostate cancer. He is scheduled for brachytherapy tomorrow. No significant LUTS. He stopped his Eliquis yesterday. No new medical problems.    PMH: Past Medical History:  Diagnosis Date  . Anxiety   . Arrhythmia   . Arthritis   . Asthma   . Atrial fibrillation ([place])   . COPD (chronic obstructive pulmonary disease) ([place])   . Depression   . Diabetes mellitus without complication ([place])    tpe 2   . Dyspnea    with exertion  and iwth humidity   . Dysrhythmia    afib   . Edema    lower extremities   . GERD (gastroesophageal reflux disease)   . Hyperchloremia   . Hypertension   . PONV (postoperative nausea and vomiting)   . Prostate cancer Bryan Hamilton)     Surgical History: Past Surgical History:  Procedure Laterality Date  . BACK SURGERY    . PROSTATE BIOPSY N/A 07/20/2019   Procedure: PROSTATE BIOPSY;  Surgeon: Cleon Gustin, MD;  Location: [facility];  Service: Urology;  Laterality: N/A;    Hamilton Medications:  Allergies as of 10/13/2019   No Known Allergies     Medication List       Accurate as of October 13, 2019 11:02 AM. If you have any questions, ask your nurse or doctor.        acetaminophen 650 MG CR tablet Commonly known as: TYLENOL Take 1,300 mg by mouth 2 (two) times daily as needed for pain.   ALPRAZolam 0.25 MG tablet Commonly known as: XANAX Take 0.25 mg by mouth daily as needed for anxiety.   apixaban 5 MG Tabs tablet Commonly known as: ELIQUIS Take 1 tablet (5 mg total) by mouth 2 (two) times daily.   atorvastatin 20 MG tablet Commonly known as: LIPITOR Take 20 mg by mouth at bedtime.   diclofenac Sodium 1 % Gel Commonly known as: VOLTAREN Apply 2 g topically daily as needed (Pain).   diltiazem 300 MG 24 hr  capsule Commonly known as: Cardizem CD Take 1 capsule (300 mg total) by mouth daily.   furosemide 40 MG tablet Commonly known as: LASIX Take 1 tablet (40 mg total) by mouth daily as needed for edema.   metFORMIN 500 MG tablet Commonly known as: GLUCOPHAGE Take by mouth daily. Pt takes at 2000 pm nightly   metoprolol tartrate 50 MG tablet Commonly known as: LOPRESSOR Take 50 mg by mouth 2 (two) times daily.   OMEGA 3 PO Take 4 capsules by mouth daily. Last dose on 09/29/19   ProAir HFA 108 (90 Base) MCG/ACT inhaler Generic drug: albuterol Inhale 2 puffs into the lungs every 6 (six) hours as needed for wheezing or shortness of breath.   sertraline 25 MG tablet Commonly known as: ZOLOFT Take 25 mg by mouth at bedtime.   Systane 0.4-0.3 % Soln Generic drug: Polyethyl Glycol-Propyl Glycol Place 1-2 drops into both eyes daily as needed (Dry eye).   traMADol 50 MG tablet Commonly known as: ULTRAM Take 1 tablet (50 mg total) by mouth every 6 (six) hours as needed for moderate pain.   Trelegy Ellipta 100-62.5-25 MCG/INH Aepb Generic drug: Fluticasone-Umeclidin-Vilant Inhale 1 puff into the lungs daily.       Allergies: No Known  Allergies  Family History: Family History  Problem Relation Age of Onset  . Cancer Father        type unknown  . Breast cancer Neg Hx   . Prostate cancer Neg Hx   . Colon cancer Neg Hx   . Pancreatic cancer Neg Hx     Social History:  reports that he quit smoking about 11 years ago. His smoking use included cigarettes. He has a 80.00 pack-year smoking history. He has never used smokeless tobacco. He reports current alcohol use. He reports that he does not use drugs.  ROS: All other review of systems were reviewed and are negative except what is noted above in HPI  Physical Exam: BP (!) 142/66   Pulse (!) 120   Temp (!) 97.5 F (36.4 C)   Ht 5\' 8"  (1.727 m)   Wt 195 lb (88.5 kg)   BMI 29.65 kg/m   Constitutional:  Alert and oriented, No  acute distress. HEENT: [place] AT, moist mucus membranes.  Trachea midline, no masses. Cardiovascular: No clubbing, cyanosis, or edema. Respiratory: Normal respiratory effort, no increased work of breathing. GI: Abdomen is soft, nontender, nondistended, no abdominal masses GU: No CVA tenderness.  Lymph: No cervical or inguinal lymphadenopathy. Skin: No rashes, bruises or suspicious lesions. Neurologic: Grossly intact, no focal deficits, moving all 4 extremities. Psychiatric: Normal mood and affect.  Laboratory Data: Lab Results  Component Value Date   WBC 7.9 10/11/2019   HGB 13.2 10/11/2019   HCT 41.0 10/11/2019   MCV 93.2 10/11/2019   PLT 276 10/11/2019    Lab Results  Component Value Date   CREATININE 1.06 10/11/2019    No results found for: PSA  No results found for: TESTOSTERONE  No results found for: HGBA1C  Urinalysis No results found for: COLORURINE, APPEARANCEUR, LABSPEC, PHURINE, GLUCOSEU, HGBUR, BILIRUBINUR, KETONESUR, PROTEINUR, UROBILINOGEN, NITRITE, LEUKOCYTESUR  No results found for: LABMICR, [place], RBCUA, LABEPIT, MUCUS, BACTERIA  Pertinent Imaging:  No results found for this or any previous visit.  No results found for this or any previous visit.  No results found for this or any previous visit.  No results found for this or any previous visit.  No results found for this or any previous visit.  No results found for this or any previous visit.  No results found for this or any previous visit.  No results found for this or any previous visit.   Assessment & Plan:    1. Prostate cancer [facility]) -Patient scheduled for brachytherapy tomorrow. He will followup Wednesday for a voiding trial.  - Urinalysis, Routine w reflex microscopic   No follow-ups on file.  Nicolette Bang, MD  [facility] [place]

## 2019-10-14 ENCOUNTER — Ambulatory Visit (HOSPITAL_BASED_OUTPATIENT_CLINIC_OR_DEPARTMENT_OTHER): Payer: Medicare Other | Admitting: Anesthesiology

## 2019-10-14 ENCOUNTER — Ambulatory Visit (HOSPITAL_COMMUNITY): Payer: Medicare Other

## 2019-10-14 ENCOUNTER — Encounter (HOSPITAL_BASED_OUTPATIENT_CLINIC_OR_DEPARTMENT_OTHER)
Admission: RE | Disposition: A | Discharge status: Home / Self Care | Payer: Self-pay | Source: Home / Self Care | Attending: Urology

## 2019-10-14 ENCOUNTER — Ambulatory Visit (HOSPITAL_BASED_OUTPATIENT_CLINIC_OR_DEPARTMENT_OTHER)
Admission: RE | Admit: 2019-10-14 | Discharge: 2019-10-14 | Disposition: A | Discharge status: Home / Self Care | Payer: Medicare Other | Attending: Urology | Admitting: Urology

## 2019-10-14 ENCOUNTER — Encounter (HOSPITAL_BASED_OUTPATIENT_CLINIC_OR_DEPARTMENT_OTHER): Payer: Self-pay | Admitting: Urology

## 2019-10-14 DIAGNOSIS — Z809 Family history of malignant neoplasm, unspecified: Secondary | ICD-10-CM | POA: Insufficient documentation

## 2019-10-14 DIAGNOSIS — E7800 Pure hypercholesterolemia, unspecified: Secondary | ICD-10-CM | POA: Diagnosis not present

## 2019-10-14 DIAGNOSIS — Z7901 Long term (current) use of anticoagulants: Secondary | ICD-10-CM | POA: Insufficient documentation

## 2019-10-14 DIAGNOSIS — Z79899 Other long term (current) drug therapy: Secondary | ICD-10-CM | POA: Diagnosis not present

## 2019-10-14 DIAGNOSIS — Z87891 Personal history of nicotine dependence: Secondary | ICD-10-CM | POA: Insufficient documentation

## 2019-10-14 DIAGNOSIS — Z7984 Long term (current) use of oral hypoglycemic drugs: Secondary | ICD-10-CM | POA: Insufficient documentation

## 2019-10-14 DIAGNOSIS — E119 Type 2 diabetes mellitus without complications: Secondary | ICD-10-CM | POA: Diagnosis not present

## 2019-10-14 DIAGNOSIS — M1990 Unspecified osteoarthritis, unspecified site: Secondary | ICD-10-CM | POA: Diagnosis not present

## 2019-10-14 DIAGNOSIS — F329 Major depressive disorder, single episode, unspecified: Secondary | ICD-10-CM | POA: Insufficient documentation

## 2019-10-14 DIAGNOSIS — C61 Malignant neoplasm of prostate: Secondary | ICD-10-CM | POA: Insufficient documentation

## 2019-10-14 DIAGNOSIS — I10 Essential (primary) hypertension: Secondary | ICD-10-CM | POA: Insufficient documentation

## 2019-10-14 DIAGNOSIS — F419 Anxiety disorder, unspecified: Secondary | ICD-10-CM | POA: Insufficient documentation

## 2019-10-14 DIAGNOSIS — I4891 Unspecified atrial fibrillation: Secondary | ICD-10-CM | POA: Insufficient documentation

## 2019-10-14 DIAGNOSIS — J449 Chronic obstructive pulmonary disease, unspecified: Secondary | ICD-10-CM | POA: Insufficient documentation

## 2019-10-14 HISTORY — DX: Nausea with vomiting, unspecified: R11.2

## 2019-10-14 HISTORY — DX: Cardiac arrhythmia, unspecified: I49.9

## 2019-10-14 HISTORY — PX: SPACE OAR INSTILLATION: SHX6769

## 2019-10-14 HISTORY — PX: CYSTOSCOPY: SHX5120

## 2019-10-14 HISTORY — PX: RADIOACTIVE SEED IMPLANT: SHX5150

## 2019-10-14 HISTORY — DX: Other specified postprocedural states: Z98.890

## 2019-10-14 LAB — GLUCOSE, CAPILLARY: Glucose-Capillary: 107 mg/dL — ABNORMAL HIGH (ref 70–99)

## 2019-10-14 SURGERY — INSERTION, RADIATION SOURCE, PROSTATE
Anesthesia: General | Site: Rectum

## 2019-10-14 MED ORDER — PHENYLEPHRINE HCL-NACL 10-0.9 MG/250ML-% IV SOLN
INTRAVENOUS | Status: DC | PRN
  Administered 2019-10-14: 40 ug/min via INTRAVENOUS

## 2019-10-14 MED ORDER — PHENYLEPHRINE 40 MCG/ML (10ML) SYRINGE FOR IV PUSH (FOR BLOOD PRESSURE SUPPORT)
PREFILLED_SYRINGE | INTRAVENOUS | Status: DC | PRN
  Administered 2019-10-14: 120 ug via INTRAVENOUS
  Administered 2019-10-14: 80 ug via INTRAVENOUS

## 2019-10-14 MED ORDER — GLYCOPYRROLATE PF 0.2 MG/ML IJ SOSY
PREFILLED_SYRINGE | INTRAMUSCULAR | Status: AC
  Filled 2019-10-14: qty 1

## 2019-10-14 MED ORDER — EPHEDRINE 5 MG/ML INJ
INTRAVENOUS | Status: AC
  Filled 2019-10-14: qty 10

## 2019-10-14 MED ORDER — IOHEXOL 300 MG/ML  SOLN
INTRAMUSCULAR | Status: DC | PRN
  Administered 2019-10-14: 7 mL

## 2019-10-14 MED ORDER — LIDOCAINE 2% (20 MG/ML) 5 ML SYRINGE
INTRAMUSCULAR | Status: AC
  Filled 2019-10-14: qty 5

## 2019-10-14 MED ORDER — PHENYLEPHRINE HCL (PRESSORS) 10 MG/ML IV SOLN
INTRAVENOUS | Status: AC
  Filled 2019-10-14: qty 1

## 2019-10-14 MED ORDER — MIDAZOLAM HCL 2 MG/2ML IJ SOLN
INTRAMUSCULAR | Status: AC
  Filled 2019-10-14: qty 2

## 2019-10-14 MED ORDER — ONDANSETRON HCL 4 MG/2ML IJ SOLN
INTRAMUSCULAR | Status: DC | PRN
  Administered 2019-10-14: 4 mg via INTRAVENOUS

## 2019-10-14 MED ORDER — MIDAZOLAM HCL 5 MG/5ML IJ SOLN
INTRAMUSCULAR | Status: DC | PRN
  Administered 2019-10-14: 1 mg via INTRAVENOUS

## 2019-10-14 MED ORDER — FLEET ENEMA 7-19 GM/118ML RE ENEM: 1.0000 | ENEMA | Freq: Once | RECTAL | Status: DC

## 2019-10-14 MED ORDER — FENTANYL CITRATE (PF) 250 MCG/5ML IJ SOLN
INTRAMUSCULAR | Status: AC
Start: 2019-10-14 — End: ?
  Filled 2019-10-14: qty 5

## 2019-10-14 MED ORDER — ONDANSETRON HCL 4 MG/2ML IJ SOLN
INTRAMUSCULAR | Status: AC
  Filled 2019-10-14: qty 2

## 2019-10-14 MED ORDER — DEXAMETHASONE SODIUM PHOSPHATE 10 MG/ML IJ SOLN
INTRAMUSCULAR | Status: AC
  Filled 2019-10-14: qty 1

## 2019-10-14 MED ORDER — CEFAZOLIN SODIUM-DEXTROSE 2-4 GM/100ML-% IV SOLN
INTRAVENOUS | Status: AC
  Filled 2019-10-14: qty 100

## 2019-10-14 MED ORDER — TRAMADOL HCL 50 MG PO TABS: 50.0000 mg | ORAL_TABLET | Freq: Four times a day (QID) | ORAL | 0 refills | Status: DC | PRN

## 2019-10-14 MED ORDER — PROPOFOL 10 MG/ML IV BOLUS
INTRAVENOUS | Status: AC
  Filled 2019-10-14: qty 20

## 2019-10-14 MED ORDER — ONDANSETRON HCL 4 MG/2ML IJ SOLN: 4.0000 mg | Freq: Once | INTRAMUSCULAR | Status: DC | PRN

## 2019-10-14 MED ORDER — FENTANYL CITRATE (PF) 100 MCG/2ML IJ SOLN
INTRAMUSCULAR | Status: DC | PRN
Start: 2019-10-14 — End: 2019-10-14
  Administered 2019-10-14: 50 ug via INTRAVENOUS

## 2019-10-14 MED ORDER — FENTANYL CITRATE (PF) 100 MCG/2ML IJ SOLN: 25.0000 ug | INTRAMUSCULAR | Status: DC | PRN

## 2019-10-14 MED ORDER — PROPOFOL 10 MG/ML IV BOLUS
INTRAVENOUS | Status: DC | PRN
  Administered 2019-10-14: 20 mg via INTRAVENOUS
  Administered 2019-10-14: 150 mg via INTRAVENOUS

## 2019-10-14 MED ORDER — EPHEDRINE SULFATE-NACL 50-0.9 MG/10ML-% IV SOSY
PREFILLED_SYRINGE | INTRAVENOUS | Status: DC | PRN
  Administered 2019-10-14 (×6): 10 mg via INTRAVENOUS

## 2019-10-14 MED ORDER — LACTATED RINGERS IV SOLN: INTRAVENOUS | Status: DC

## 2019-10-14 MED ORDER — DEXAMETHASONE SODIUM PHOSPHATE 10 MG/ML IJ SOLN
INTRAMUSCULAR | Status: DC | PRN
  Administered 2019-10-14: 10 mg via INTRAVENOUS

## 2019-10-14 MED ORDER — CEFAZOLIN SODIUM-DEXTROSE 2-4 GM/100ML-% IV SOLN
2.0000 g | Freq: Once | INTRAVENOUS | Status: AC
  Administered 2019-10-14: 2 g via INTRAVENOUS

## 2019-10-14 MED ORDER — DEXMEDETOMIDINE (PRECEDEX) IN NS 20 MCG/5ML (4 MCG/ML) IV SYRINGE
PREFILLED_SYRINGE | INTRAVENOUS | Status: DC | PRN
  Administered 2019-10-14: 8 ug via INTRAVENOUS

## 2019-10-14 MED ORDER — SODIUM CHLORIDE 0.9 % IR SOLN
Status: DC | PRN
  Administered 2019-10-14: 1000 mL via INTRAVESICAL

## 2019-10-14 MED ORDER — ATROPINE SULFATE 1 MG/10ML IJ SOSY
PREFILLED_SYRINGE | INTRAMUSCULAR | Status: AC
  Filled 2019-10-14: qty 10

## 2019-10-14 MED ORDER — GLYCOPYRROLATE 0.2 MG/ML IJ SOLN
INTRAMUSCULAR | Status: DC | PRN
  Administered 2019-10-14: .2 mg via INTRAVENOUS

## 2019-10-14 MED ORDER — PHENYLEPHRINE 40 MCG/ML (10ML) SYRINGE FOR IV PUSH (FOR BLOOD PRESSURE SUPPORT)
PREFILLED_SYRINGE | INTRAVENOUS | Status: AC
  Filled 2019-10-14: qty 10

## 2019-10-14 MED ORDER — LIDOCAINE 2% (20 MG/ML) 5 ML SYRINGE
INTRAMUSCULAR | Status: DC | PRN
  Administered 2019-10-14: 100 mg via INTRAVENOUS

## 2019-10-14 MED ORDER — DEXMEDETOMIDINE (PRECEDEX) IN NS 20 MCG/5ML (4 MCG/ML) IV SYRINGE
PREFILLED_SYRINGE | INTRAVENOUS | Status: AC
  Filled 2019-10-14: qty 5

## 2019-10-14 MED ORDER — SODIUM CHLORIDE (PF) 0.9 % IJ SOLN
INTRAMUSCULAR | Status: DC | PRN
  Administered 2019-10-14: 10 mL

## 2019-10-14 SURGICAL SUPPLY — 40 items
BAG DRN RND TRDRP ANRFLXCHMBR (UROLOGICAL SUPPLIES) ×3
BAG URINE DRAIN 2000ML AR STRL (UROLOGICAL SUPPLIES) ×4 IMPLANT
BLADE CLIPPER SENSICLIP SURGIC (BLADE) ×4 IMPLANT
CATH FOLEY 2WAY SLVR  5CC 16FR (CATHETERS) ×8
CATH FOLEY 2WAY SLVR 5CC 16FR (CATHETERS) ×6 IMPLANT
CATH ROBINSON RED A/P 20FR (CATHETERS) ×4 IMPLANT
CLOTH BEACON ORANGE TIMEOUT ST (SAFETY) ×4 IMPLANT
CNTNR URN SCR LID CUP LEK RST (MISCELLANEOUS) ×6 IMPLANT
CONT SPEC 4OZ STRL OR WHT (MISCELLANEOUS) ×8
COVER BACK TABLE 60X90IN (DRAPES) ×4 IMPLANT
COVER MAYO STAND STRL (DRAPES) ×4 IMPLANT
DRAPE C-ARM 35X43 STRL (DRAPES) ×4 IMPLANT
DRSG TEGADERM 4X4.75 (GAUZE/BANDAGES/DRESSINGS) ×4 IMPLANT
DRSG TEGADERM 8X12 (GAUZE/BANDAGES/DRESSINGS) ×8 IMPLANT
GAUZE SPONGE 4X4 12PLY STRL (GAUZE/BANDAGES/DRESSINGS) ×4 IMPLANT
GLOVE BIO SURGEON STRL SZ 6.5 (GLOVE) ×4 IMPLANT
GLOVE BIO SURGEON STRL SZ7.5 (GLOVE) IMPLANT
GLOVE BIO SURGEON STRL SZ8 (GLOVE) ×8 IMPLANT
GLOVE BIOGEL PI IND STRL 7.5 (GLOVE) ×6 IMPLANT
GLOVE BIOGEL PI INDICATOR 7.5 (GLOVE) ×2
GLOVE SURG ORTHO 8.5 STRL (GLOVE) ×4 IMPLANT
GLOVE SURG SS PI 6.5 STRL IVOR (GLOVE) IMPLANT
GOWN STRL REUS W/TWL LRG LVL3 (GOWN DISPOSABLE) ×8 IMPLANT
GOWN STRL REUS W/TWL XL LVL3 (GOWN DISPOSABLE) ×8 IMPLANT
HOLDER FOLEY CATH W/STRAP (MISCELLANEOUS) ×4 IMPLANT
I SEED AGX100 ×276 IMPLANT
IMPL SPACEOAR VUE SYSTEM (Spacer) ×3 IMPLANT
IMPLANT SPACEOAR VUE SYSTEM (Spacer) ×4 IMPLANT
IV NS 1000ML (IV SOLUTION) ×4
IV NS 1000ML BAXH (IV SOLUTION) ×3 IMPLANT
KIT TURNOVER CYSTO (KITS) ×4 IMPLANT
MANIFOLD NEPTUNE II (INSTRUMENTS) IMPLANT
MARKER SKIN DUAL TIP RULER LAB (MISCELLANEOUS) ×4 IMPLANT
PACK CYSTO (CUSTOM PROCEDURE TRAY) ×4 IMPLANT
SURGILUBE 2OZ TUBE FLIPTOP (MISCELLANEOUS) ×4 IMPLANT
SYR 10ML LL (SYRINGE) ×8 IMPLANT
TOWEL OR 17X26 10 PK STRL BLUE (TOWEL DISPOSABLE) ×8 IMPLANT
UNDERPAD 30X36 HEAVY ABSORB (UNDERPADS AND DIAPERS) ×8 IMPLANT
WATER STERILE IRR 3000ML UROMA (IV SOLUTION) ×4 IMPLANT
WATER STERILE IRR 500ML POUR (IV SOLUTION) ×4 IMPLANT

## 2019-10-14 NOTE — Transfer of Care (Signed)
Immediate Anesthesia Transfer of Care Note  Patient: Latrell Potempa Wisler  Procedure(s) Performed: RADIOACTIVE SEED IMPLANT/BRACHYTHERAPY IMPLANT (N/A Prostate) SPACE OAR INSTILLATION (N/A Rectum) CYSTOSCOPY FLEXIBLE (N/A Bladder)  Patient Location: PACU  Anesthesia Type:General  Level of Consciousness: awake, oriented and patient cooperative  Airway & Oxygen Therapy: Patient Spontanous Breathing and Patient connected to nasal cannula oxygen  Post-op Assessment: Report given to RN, Post -op Vital signs reviewed and stable and Patient moving all extremities X 4  Post vital signs: Reviewed and stable  Last Vitals:  Vitals Value Taken Time  BP    Temp    Pulse 113 10/14/19 0924  Resp 20 10/14/19 0924  SpO2 89 % 10/14/19 0924  Vitals shown include unvalidated device data.  Last Pain:  Vitals:   10/14/19 0619  TempSrc: Oral  PainSc: 0-No pain      Patients Stated Pain Goal: 5 (50/03/70 4888)  Complications: No complications documented.

## 2019-10-14 NOTE — Interval H&P Note (Signed)
History and Physical Interval Note:  10/14/2019 7:35 AM  Bryan Hamilton Bryan Hamilton  has presented today for surgery, with the diagnosis of PROSTATE CANCER.  The various methods of treatment have been discussed with the patient and family. After consideration of risks, benefits and other options for treatment, the patient has consented to  Procedure(s) with comments: RADIOACTIVE SEED IMPLANT/BRACHYTHERAPY IMPLANT (N/A) - 90 MINS SPACE OAR INSTILLATION (N/A) as a surgical intervention.  The patient's history has been reviewed, patient examined, no change in status, stable for surgery.  I have reviewed the patient's chart and labs.  Questions were answered to the patient's satisfaction.     Nicolette Bang

## 2019-10-14 NOTE — Discharge Instructions (Signed)
Indwelling Urinary Catheter Care, Adult An indwelling urinary catheter is a thin tube that is put into your bladder. The tube helps to drain pee (urine) out of your body. The tube goes in through your urethra. Your urethra is where pee comes out of your body. Your pee will come out through the catheter, then it will go into a bag (drainage bag). Take good care of your catheter so it will work well. How to wear your catheter and bag Supplies needed  Sticky tape (adhesive tape) or a leg strap.  Alcohol wipe or soap and water (if you use tape).  A clean towel (if you use tape).  Large overnight bag.  Smaller bag (leg bag). Wearing your catheter Attach your catheter to your leg with tape or a leg strap.  Make sure the catheter is not pulled tight.  If a leg strap gets wet, take it off and put on a dry strap.  If you use tape to hold the bag on your leg: 1. Use an alcohol wipe or soap and water to wash your skin where the tape made it sticky before. 2. Use a clean towel to pat-dry that skin. 3. Use new tape to make the bag stay on your leg. Wearing your bags You should have been given a large overnight bag.  You may wear the overnight bag in the day or night.  Always have the overnight bag lower than your bladder.  Do not let the bag touch the floor.  Before you go to sleep, put a clean plastic bag in a wastebasket. Then hang the overnight bag inside the wastebasket. You should also have a smaller leg bag that fits under your clothes.  Always wear the leg bag below your knee.  Do not wear your leg bag at night. How to care for your skin and catheter Supplies needed  A clean washcloth.  Water and mild soap.  A clean towel. Caring for your skin and catheter      Clean the skin around your catheter every day: 1. Wash your hands with soap and water. 2. Wet a clean washcloth in warm water and mild soap. 3. Clean the skin around your urethra.  If you are  male:  Gently spread the folds of skin around your vagina (labia).  With the washcloth in your other hand, wipe the inner side of your labia on each side. Wipe from front to back.  If you are male:  Pull back any skin that covers the end of your penis (foreskin).  With the washcloth in your other hand, wipe your penis in small circles. Start wiping at the tip of your penis, then move away from the catheter.  Move the foreskin back in place, if needed. 4. With your free hand, hold the catheter close to where it goes into your body.  Keep holding the catheter during cleaning so it does not get pulled out. 5. With the washcloth in your other hand, clean the catheter.  Only wipe downward on the catheter.  Do not wipe upward toward your body. Doing this may push germs into your urethra and cause infection. 6. Use a clean towel to pat-dry the catheter and the skin around it. Make sure to wipe off all soap. 7. Wash your hands with soap and water.  Shower every day. Do not take baths.  Do not use cream, ointment, or lotion on the area where the catheter goes into your body, unless your doctor tells you   to.  Do not use powders, sprays, or lotions on your genital area.  Check your skin around the catheter every day for signs of infection. Check for: ? Redness, swelling, or pain. ? Fluid or blood. ? Warmth. ? Pus or a bad smell. How to empty the bag Supplies needed  Rubbing alcohol.  Gauze pad or cotton ball.  Tape or a leg strap. Emptying the bag Pour the pee out of your bag when it is ?- full, or at least 2-3 times a day. Do this for your overnight bag and your leg bag. 1. Wash your hands with soap and water. 2. Separate (detach) the bag from your leg. 3. Hold the bag over the toilet or a clean pail. Keep the bag lower than your hips and bladder. This is so the pee (urine) does not go back into the tube. 4. Open the pour spout. It is at the bottom of the bag. 5. Empty the  pee into the toilet or pail. Do not let the pour spout touch any surface. 6. Put rubbing alcohol on a gauze pad or cotton ball. 7. Use the gauze pad or cotton ball to clean the pour spout. 8. Close the pour spout. 9. Attach the bag to your leg with tape or a leg strap. 10. Wash your hands with soap and water. Follow instructions for cleaning the drainage bag:  From the product maker.  As told by your doctor. How to change the bag Supplies needed  Alcohol wipes.  A clean bag.  Tape or a leg strap. Changing the bag Replace your bag when it starts to leak, smell bad, or look dirty. 1. Wash your hands with soap and water. 2. Separate the dirty bag from your leg. 3. Pinch the catheter with your fingers so that pee does not spill out. 4. Separate the catheter tube from the bag tube where these tubes connect (at the connection valve). Do not let the tubes touch any surface. 5. Clean the end of the catheter tube with an alcohol wipe. Use a different alcohol wipe to clean the end of the bag tube. 6. Connect the catheter tube to the tube of the clean bag. 7. Attach the clean bag to your leg with tape or a leg strap. Do not make the bag tight on your leg. 8. Wash your hands with soap and water. General rules   Never pull on your catheter. Never try to take it out. Doing that can hurt you.  Always wash your hands before and after you touch your catheter or bag. Use a mild, fragrance-free soap. If you do not have soap and water, use hand sanitizer.  Always make sure there are no twists or bends (kinks) in the catheter tube.  Always make sure there are no leaks in the catheter or bag.  Drink enough fluid to keep your pee pale yellow.  Do not take baths, swim, or use a hot tub.  If you are male, wipe from front to back after you poop (have a bowel movement). Contact a doctor if:  Your pee is cloudy.  Your pee smells worse than usual.  Your catheter gets clogged.  Your catheter  leaks.  Your bladder feels full. Get help right away if:  You have redness, swelling, or pain where the catheter goes into your body.  You have fluid, blood, pus, or a bad smell coming from the area where the catheter goes into your body.  Your skin feels warm where   the catheter goes into your body.  You have a fever.  You have pain in your: ? Belly (abdomen). ? Legs. ? Lower back. ? Bladder.  You see blood in the catheter.  Your pee is pink or red.  You feel sick to your stomach (nauseous).  You throw up (vomit).  You have chills.  Your pee is not draining into the bag.  Your catheter gets pulled out. Summary  An indwelling urinary catheter is a thin tube that is placed into the bladder to help drain pee (urine) out of the body.  The catheter is placed into the part of the body that drains pee from the bladder (urethra).  Taking good care of your catheter will keep it working properly and help prevent problems.  Always wash your hands before and after touching your catheter or bag.  Never pull on your catheter or try to take it out. This information is not intended to replace advice given to you by your health care provider. Make sure you discuss any questions you have with your health care provider. Document Revised: 04/30/2018 Document Reviewed: 08/22/2016 Elsevier Patient Education  [place] Instructions  Activity: Get plenty of rest for the remainder of the day. A responsible individual must stay with you for 24 hours following the procedure.  For the next 24 hours, DO NOT: -Drive a car -Paediatric nurse -Drink alcoholic beverages -Take any medication unless instructed by your physician -Make any legal decisions or sign important papers.  Meals: Start with liquid foods such as gelatin or soup. Progress to regular foods as tolerated. Avoid greasy, spicy, heavy foods. If nausea and/or vomiting occur, drink only  clear liquids until the nausea and/or vomiting subsides. Call your physician if vomiting continues.  Special Instructions/Symptoms: Your throat may feel dry or sore from the anesthesia or the breathing tube placed in your throat during surgery. If this causes discomfort, gargle with warm salt water. The discomfort should disappear within 24 hours.  If you had a scopolamine patch placed behind your ear for the management of post- operative nausea and/or vomiting:  1. The medication in the patch is effective for 72 hours, after which it should be removed.  Wrap patch in a tissue and discard in the trash. Wash hands thoroughly with soap and water. 2. You may remove the patch earlier than 72 hours if you experience unpleasant side effects which may include dry mouth, dizziness or visual disturbances. 3. Avoid touching the patch. Wash your hands with soap and water after contact with the patch.

## 2019-10-14 NOTE — Anesthesia Procedure Notes (Signed)
Procedure Name: LMA Insertion Date/Time: 10/14/2019 8:17 AM Performed by: Rogers Blocker, CRNA Pre-anesthesia Checklist: Patient identified, Emergency Drugs available, Suction available and Patient being monitored Patient Re-evaluated:Patient Re-evaluated prior to induction Oxygen Delivery Method: Circle System Utilized Preoxygenation: Pre-oxygenation with 100% oxygen Induction Type: IV induction Ventilation: Mask ventilation without difficulty LMA: LMA inserted LMA Size: 5.0 Number of attempts: 1 Airway Equipment and Method: Bite block Placement Confirmation: positive ETCO2 Tube secured with: Tape Dental Injury: Teeth and Oropharynx as per pre-operative assessment

## 2019-10-14 NOTE — Op Note (Signed)
PRE-OPERATIVE DIAGNOSIS:  Adenocarcinoma of the prostate  POST-OPERATIVE DIAGNOSIS:  Same  PROCEDURE:  Procedure(s): 1. I-125 radioactive seed implantation 2. Cystoscopy 3. SpaceOAR placement  SURGEON:  Surgeon(s): Nicolette Bang, MD  Radiation oncologist: Tyler Pita, MD  ANESTHESIA:  General  EBL:  Minimal  DRAINS: 64 French Foley catheter  INDICATION: Bryan Hamilton is a 74 year old with a history of T1c prostate cancer. After discussing treatment options he has elected to proceed with brachytherapy  Description of procedure: After informed consent the patient was brought to the major OR, placed on the table and administered general anesthesia. He was then moved to the modified lithotomy position with his perineum perpendicular to the floor. His perineum and genitalia were then sterilely prepped. An official timeout was then performed. A 16 French Foley catheter was then placed in the bladder and filled with dilute contrast, a rectal tube was placed in the rectum and the transrectal ultrasound probe was placed in the rectum and affixed to the stand. He was then sterilely draped.  Real time ultrasonography was used along with the seed planning software [employer] Prostate vs. 4.2.21. This was used to develop the seed plan including the number of needles as well as number of seeds required for complete and adequate coverage. Real-time ultrasonography was then used along with the previously developed plan and the Nucletron device to implant a total of 69 seeds using 21 needles. This proceeded without difficulty or complication.  We then proceeded to mix the SpaceOAR using the kit supplied from the manufacturer. Once this was complete we placed a sinal needle into the perirectal fat between the rectum and the prostate. Once this was accomplished we injected 2cc of normal saline to hydrodissect the plain. We then instilled the the SpaceOAR through the spinal needle and noted good  distribution in the perirectal fat.    A Foley catheter was then removed as well as the transrectal ultrasound probe and rectal probe. Flexible cystoscopy was then performed using the 17 French flexible scope which revealed a normal urethra throughout its length down to the sphincter which appeared intact. The prostatic urethra revealed bilobar hypertrophy but no evidence of obstruction, seeds, spacers or lesions. The bladder was then entered and fully and systematically inspected. The ureteral orifices were noted to be of normal configuration and position. The mucosa revealed no evidence of tumors. There were also no stones identified within the bladder. I noted no seeds or spacers on the floor of the bladder and retroflexion of the scope revealed no seeds protruding from the base of the prostate.  The cystoscope was then removed and a new 60 French Foley catheter was then inserted and the balloon was filled with 10 cc of sterile water. This was connected to closed system drainage and the patient was awakened and taken to recovery room in stable and satisfactory condition. He tolerated procedure well and there were no intraoperative complications.

## 2019-10-14 NOTE — Anesthesia Postprocedure Evaluation (Signed)
Anesthesia Post Note  Patient: Bryan Hamilton  Procedure(s) Performed: RADIOACTIVE SEED IMPLANT/BRACHYTHERAPY IMPLANT (N/A Prostate) SPACE OAR INSTILLATION (N/A Rectum) CYSTOSCOPY FLEXIBLE (N/A Bladder)     Patient location during evaluation: PACU Anesthesia Type: General Level of consciousness: sedated and patient cooperative Pain management: pain level controlled Vital Signs Assessment: post-procedure vital signs reviewed and stable Respiratory status: spontaneous breathing Cardiovascular status: stable Anesthetic complications: no   No complications documented.  Last Vitals:  Vitals:   10/14/19 1000 10/14/19 1045  BP: 104/62 111/67  Pulse: 90 88  Resp: 16 18  Temp:  (!) 36.4 C  SpO2: 98% 90%    Last Pain:  Vitals:   10/14/19 1115  TempSrc:   PainSc: 0-No pain                 Nolon Nations

## 2019-10-16 NOTE — Progress Notes (Signed)
  Radiation Oncology         [phone] ________________________________  Name: HONORIO DEVOL MRN: [id number]  Date: 10/16/2019  DOB: 02/19/1945       Prostate Seed Implant  LT:[id number], Edwinna Areola, MD  No ref. provider found  DIAGNOSIS:  Oncology History   No history exists.    No diagnosis found.  PROCEDURE: Insertion of radioactive I-125 seeds into the prostate gland.  RADIATION DOSE: 145 Gy, definitive therapy.  TECHNIQUE: ERASMUS BISTLINE was brought to the operating room with the urologist. He was placed in the dorsolithotomy position. He was catheterized and a rectal tube was inserted. The perineum was shaved, prepped and draped. The ultrasound probe was then introduced into the rectum to see the prostate gland.  TREATMENT DEVICE: A needle grid was attached to the ultrasound probe stand and anchor needles were placed.  3D PLANNING: The prostate was imaged in 3D using a sagittal sweep of the prostate probe. These images were transferred to the planning computer. There, the prostate, urethra and rectum were defined on each axial reconstructed image. Then, the software created an optimized 3D plan and a few seed positions were adjusted. The quality of the plan was reviewed using [facility] information for the target and the following two organs at risk:  Urethra and Rectum.  Then the accepted plan was printed and handed off to the radiation therapist.  Under my supervision, the custom loading of the seeds and spacers was carried out and loaded into sealed vicryl sleeves.  These pre-loaded needles were then placed into the needle holder.Marland Kitchen  PROSTATE VOLUME STUDY:  Using transrectal ultrasound the volume of the prostate was verified to be 25.8 cc.  SPECIAL TREATMENT PROCEDURE/SUPERVISION AND HANDLING: The pre-loaded needles were then delivered under sagittal guidance. A total of 21 needles were used to deposit 69 seeds in the prostate gland. The individual seed activity was 0.349  mCi.  SpaceOAR:  Yes  COMPLEX SIMULATION: At the end of the procedure, an anterior radiograph of the pelvis was obtained to document seed positioning and count. Cystoscopy was performed to check the urethra and bladder.  MICRODOSIMETRY: At the end of the procedure, the patient was emitting 0.072 mR/hr at 1 meter. Accordingly, he was considered safe for hospital discharge.  PLAN: The patient will return to the radiation oncology clinic for post implant CT dosimetry in three weeks.   ________________________________  Sheral Apley Tammi Klippel, M.D.

## 2019-10-17 ENCOUNTER — Encounter (HOSPITAL_BASED_OUTPATIENT_CLINIC_OR_DEPARTMENT_OTHER): Payer: Self-pay | Admitting: Urology

## 2019-10-19 ENCOUNTER — Ambulatory Visit (INDEPENDENT_AMBULATORY_CARE_PROVIDER_SITE_OTHER): Payer: Medicare Other

## 2019-10-19 ENCOUNTER — Other Ambulatory Visit: Payer: Self-pay

## 2019-10-19 DIAGNOSIS — R339 Retention of urine, unspecified: Secondary | ICD-10-CM | POA: Diagnosis not present

## 2019-10-19 LAB — BLADDER SCAN AMB NON-IMAGING: Scan Result: 129

## 2019-10-19 NOTE — Progress Notes (Signed)
Fill and Pull Catheter Removal  Patient is present today for a catheter removal.  Patient was cleaned and prepped in a sterile fashion 124ml of sterile water/ saline was instilled into the bladder when the patient felt the urge to urinate. 10 80ml of water was then drained from the balloon.  A 16FR foley cath was removed from the bladder no complications were noted .  Patient as then given some time to void on their own.  Patient can void  12ml on their own after some time.  Patient to return this afternoon for PVR per Dr. Jeffie Pollock.  Performed by: Valentina Lucks ,LPN

## 2019-10-24 ENCOUNTER — Telehealth: Payer: Self-pay | Admitting: *Deleted

## 2019-10-24 NOTE — Telephone Encounter (Signed)
CALLED PATIENT'S DAUGHTER- MARIA HUDSON TO INFORM THAT ASHLYN BRUNING WOULD LIKE TO KEEP POST SEED APPTS. ON 11-02-19, PATIENT'S DAUGHTER- MARIA HUDSON AGREED TO DO SO

## 2019-11-01 ENCOUNTER — Telehealth: Payer: Self-pay | Admitting: *Deleted

## 2019-11-01 NOTE — Progress Notes (Signed)
Radiation Oncology         [phone]) [phone] ________________________________  Name: Bryan Hamilton MRN: [id number]  Date: 11/02/2019  DOB: [birth year]  Post-Seed Follow-Up Visit Note  CC: Celene Squibb, MD  Cleon Gustin, MD  Diagnosis:   74 y.o. gentleman with Stage T1c adenocarcinoma of the prostate with Gleason score of 4+3, and PSA of 16.9.    ICD-10-CM   1. Prostate cancer ([place])  C61     Interval Since Last Radiation:  2.5 weeks 10/14/19:  Insertion of radioactive I-125 seeds into the prostate gland; 145 Gy, definitive therapy with placement of SpaceOAR VUE gel.  Narrative:  The patient returns today for routine follow-up.  He is complaining of increased urinary frequency and urinary hesitation symptoms. He filled out a questionnaire regarding urinary function today providing and overall IPSS score of 25 characterizing his symptoms as severe but improving.  He reports frequency, urgency, weak stream and occasional small volume UUI that is improving. He denies dysuria or gross hematuria. His pre-implant score was 9. He denies any abdominal pain or bowel symptoms.  He reports a healthy appetite and is maintaining his weight.  He has not noticed any significant change in his energy level.  Overall, he is quite pleased with his progress to date.  ALLERGIES:  has No Known Allergies.  Meds: Current Outpatient Medications  Medication Sig Dispense Refill   acetaminophen (TYLENOL) 650 MG CR tablet Take 1,300 mg by mouth 2 (two) times daily as needed for pain.     ALPRAZolam (XANAX) 0.25 MG tablet Take 0.25 mg by mouth daily as needed for anxiety.      apixaban (ELIQUIS) 5 MG TABS tablet Take 1 tablet (5 mg total) by mouth 2 (two) times daily. 60 tablet 11   atorvastatin (LIPITOR) 20 MG tablet Take 20 mg by mouth at bedtime.      diclofenac Sodium (VOLTAREN) 1 % GEL Apply 2 g topically daily as needed (Pain).     diltiazem (CARDIZEM CD) 300 MG 24 hr capsule Take 1 capsule (300 mg  total) by mouth daily. 90 capsule 3   furosemide (LASIX) 40 MG tablet Take 1 tablet (40 mg total) by mouth daily as needed for edema. 90 tablet 3   metFORMIN (GLUCOPHAGE) 500 MG tablet Take by mouth daily. Pt takes at 2000 pm nightly     metoprolol tartrate (LOPRESSOR) 50 MG tablet Take 50 mg by mouth 2 (two) times daily.     Omega-3 Fatty Acids (OMEGA 3 PO) Take 4 capsules by mouth daily. Last dose on 09/29/19     Polyethyl Glycol-Propyl Glycol (SYSTANE) 0.4-0.3 % SOLN Place 1-2 drops into both eyes daily as needed (Dry eye).      PROAIR HFA 108 (90 Base) MCG/ACT inhaler Inhale 2 puffs into the lungs every 6 (six) hours as needed for wheezing or shortness of breath.      sertraline (ZOLOFT) 25 MG tablet Take 25 mg by mouth at bedtime.      traMADol (ULTRAM) 50 MG tablet Take 1 tablet (50 mg total) by mouth every 6 (six) hours as needed for moderate pain. 12 tablet 0   traMADol (ULTRAM) 50 MG tablet Take 1 tablet (50 mg total) by mouth every 6 (six) hours as needed. 15 tablet 0   TRELEGY ELLIPTA 100-62.5-25 MCG/INH AEPB Inhale 1 puff into the lungs daily.     No current facility-administered medications for this visit.    Physical Findings: In general this is a  well appearing Caucasian male in no acute distress. He's alert and oriented x4 and appropriate throughout the examination. Cardiopulmonary assessment is negative for acute distress and he exhibits normal effort.   Lab Findings: Lab Results  Component Value Date   WBC 7.9 10/11/2019   HGB 13.2 10/11/2019   HCT 41.0 10/11/2019   MCV 93.2 10/11/2019   PLT 276 10/11/2019    Radiographic Findings:  Patient underwent CT imaging in our clinic for post implant dosimetry. The CT will be reviewed by Dr. Tammi Klippel to confirm there is an adequate distribution of radioactive seeds throughout the prostate gland and ensure that there are no seeds in or near the rectum. We suspect the final radiation plan and dosimetry will show appropriate  coverage of the prostate gland. He understands that we will call and inform him of any unexpected findings on further review of his imaging and dosimetry.  Impression/Plan: 74 y.o. gentleman with Stage T1c adenocarcinoma of the prostate with Gleason score of 4+3, and PSA of 16.9. The patient is recovering from the effects of radiation. His urinary symptoms should gradually improve over the next 4-6 months. We talked about this today. He is encouraged by his improvement already and is otherwise pleased with his outcome. We also talked about long-term follow-up for prostate cancer following seed implant. He understands that ongoing PSA determinations and digital rectal exams will help perform surveillance to rule out disease recurrence. He has a follow up appointment scheduled with Dr. Alyson Ingles at 1pm this afternoon. He understands what to expect with his PSA measures. Patient was also educated today about some of the long-term effects from radiation including a small risk for rectal bleeding and possibly erectile dysfunction. We talked about some of the general management approaches to these potential complications. However, I did encourage the patient to contact our office or return at any point if he has questions or concerns related to his previous radiation and prostate cancer.    Nicholos Johns, PA-C

## 2019-11-01 NOTE — Telephone Encounter (Signed)
CALLED PATIENT TO REMIND OF POST SEED APPTS. FOR 11-02-19, SPOKE WITH PATIENT AND HE IS AWARE OF THESE APPTS.

## 2019-11-02 ENCOUNTER — Ambulatory Visit
Admission: RE | Admit: 2019-11-02 | Discharge: 2019-11-02 | Disposition: A | Discharge status: Home / Self Care | Payer: Medicare Other | Source: Ambulatory Visit | Attending: Urology | Admitting: Urology

## 2019-11-02 ENCOUNTER — Ambulatory Visit (INDEPENDENT_AMBULATORY_CARE_PROVIDER_SITE_OTHER): Payer: Medicare Other | Admitting: Urology

## 2019-11-02 ENCOUNTER — Encounter: Payer: Self-pay | Admitting: Urology

## 2019-11-02 ENCOUNTER — Other Ambulatory Visit: Payer: Self-pay

## 2019-11-02 ENCOUNTER — Ambulatory Visit
Admission: RE | Admit: 2019-11-02 | Discharge: 2019-11-02 | Disposition: A | Discharge status: Home / Self Care | Payer: Medicare Other | Source: Ambulatory Visit | Attending: Radiation Oncology | Admitting: Radiation Oncology

## 2019-11-02 VITALS — BP 92/59 | HR 79

## 2019-11-02 VITALS — BP 98/67 | HR 60 | Temp 97.3°F | Resp 18 | Ht 68.0 in | Wt 202.8 lb

## 2019-11-02 DIAGNOSIS — R351 Nocturia: Secondary | ICD-10-CM

## 2019-11-02 DIAGNOSIS — Z79899 Other long term (current) drug therapy: Secondary | ICD-10-CM | POA: Diagnosis not present

## 2019-11-02 DIAGNOSIS — Z7984 Long term (current) use of oral hypoglycemic drugs: Secondary | ICD-10-CM | POA: Diagnosis not present

## 2019-11-02 DIAGNOSIS — R350 Frequency of micturition: Secondary | ICD-10-CM | POA: Insufficient documentation

## 2019-11-02 DIAGNOSIS — C61 Malignant neoplasm of prostate: Secondary | ICD-10-CM | POA: Diagnosis present

## 2019-11-02 DIAGNOSIS — N138 Other obstructive and reflux uropathy: Secondary | ICD-10-CM

## 2019-11-02 DIAGNOSIS — R3911 Hesitancy of micturition: Secondary | ICD-10-CM | POA: Insufficient documentation

## 2019-11-02 DIAGNOSIS — Z923 Personal history of irradiation: Secondary | ICD-10-CM | POA: Insufficient documentation

## 2019-11-02 DIAGNOSIS — N401 Enlarged prostate with lower urinary tract symptoms: Secondary | ICD-10-CM

## 2019-11-02 LAB — BLADDER SCAN AMB NON-IMAGING: Scan Result: 30

## 2019-11-02 MED ORDER — ALFUZOSIN HCL ER 10 MG PO TB24: 10.0000 mg | ORAL_TABLET | Freq: Every day | ORAL | 11 refills | Status: DC

## 2019-11-02 NOTE — Progress Notes (Signed)
11/02/2019 2:09 PM   Bryan Hamilton 12-Sep-1945 [id number]  Referring provider: Celene Squibb, MD [place],  [place] [address]  Followup after brachytherapy  HPI: Mr Bryan Hamilton is a 74yo here for followup. He has worsening nocturia 7-8x and urinary frequency since brachytherapy. Stream fair. PVR 30cc. No dysuria or hematuria.   PMH: Past Medical History:  Diagnosis Date  . Anxiety   . Arrhythmia   . Arthritis   . Asthma   . Atrial fibrillation ([place])   . COPD (chronic obstructive pulmonary disease) ([place])   . Depression   . Diabetes mellitus without complication ([place])    tpe 2   . Dyspnea    with exertion  and iwth humidity   . Dysrhythmia    afib   . Edema    lower extremities   . GERD (gastroesophageal reflux disease)   . Hyperchloremia   . Hypertension   . PONV (postoperative nausea and vomiting)   . Prostate cancer [facility])     Surgical History: Past Surgical History:  Procedure Laterality Date  . BACK SURGERY    . CYSTOSCOPY N/A 10/14/2019   Procedure: CYSTOSCOPY FLEXIBLE;  Surgeon: Cleon Gustin, MD;  Location: [facility];  Service: Urology;  Laterality: N/A;  NO SEEDS FOUND IN BLADDER  . PROSTATE BIOPSY N/A 07/20/2019   Procedure: PROSTATE BIOPSY;  Surgeon: Cleon Gustin, MD;  Location: [facility];  Service: Urology;  Laterality: N/A;  . RADIOACTIVE SEED IMPLANT N/A 10/14/2019   Procedure: RADIOACTIVE SEED IMPLANT/BRACHYTHERAPY IMPLANT;  Surgeon: Cleon Gustin, MD;  Location: [facility];  Service: Urology;  Laterality: N/A;    67  SEEDS IMPLANTED  . SPACE OAR INSTILLATION N/A 10/14/2019   Procedure: SPACE OAR INSTILLATION;  Surgeon: Cleon Gustin, MD;  Location: [facility];  Service: Urology;  Laterality: N/A;    Home Medications:  Allergies as of 11/02/2019   No Known Allergies     Medication List       Accurate as of November 02, 2019  2:09 PM. If you have any questions,  ask your nurse or doctor.        acetaminophen 650 MG CR tablet Commonly known as: TYLENOL Take 1,300 mg by mouth 2 (two) times daily as needed for pain.   alfuzosin 10 MG 24 hr tablet Commonly known as: UROXATRAL Take 1 tablet (10 mg total) by mouth daily with breakfast. Started by: Nicolette Bang, MD   ALPRAZolam 0.25 MG tablet Commonly known as: XANAX Take 0.25 mg by mouth daily as needed for anxiety.   apixaban 5 MG Tabs tablet Commonly known as: ELIQUIS Take 1 tablet (5 mg total) by mouth 2 (two) times daily.   atorvastatin 20 MG tablet Commonly known as: LIPITOR Take 20 mg by mouth at bedtime.   diclofenac Sodium 1 % Gel Commonly known as: VOLTAREN Apply 2 g topically daily as needed (Pain).   diltiazem 300 MG 24 hr capsule Commonly known as: Cardizem CD Take 1 capsule (300 mg total) by mouth daily.   furosemide 40 MG tablet Commonly known as: LASIX Take 1 tablet (40 mg total) by mouth daily as needed for edema.   lisinopril 10 MG tablet Commonly known as: ZESTRIL Take 10 mg by mouth 2 (two) times daily.   metFORMIN 500 MG tablet Commonly known as: GLUCOPHAGE Take by mouth daily. Pt takes at 2000 pm nightly   metoprolol tartrate 50 MG tablet Commonly known  as: LOPRESSOR Take 50 mg by mouth 2 (two) times daily.   OMEGA 3 PO Take 4 capsules by mouth daily. Last dose on 09/29/19   ProAir HFA 108 (90 Base) MCG/ACT inhaler Generic drug: albuterol Inhale 2 puffs into the lungs every 6 (six) hours as needed for wheezing or shortness of breath.   sertraline 25 MG tablet Commonly known as: ZOLOFT Take 25 mg by mouth at bedtime.   Systane 0.4-0.3 % Soln Generic drug: Polyethyl Glycol-Propyl Glycol Place 1-2 drops into both eyes daily as needed (Dry eye).   traMADol 50 MG tablet Commonly known as: ULTRAM Take 1 tablet (50 mg total) by mouth every 6 (six) hours as needed for moderate pain.   traMADol 50 MG tablet Commonly known as: Ultram Take 1 tablet  (50 mg total) by mouth every 6 (six) hours as needed.   Trelegy Ellipta 100-62.5-25 MCG/INH Aepb Generic drug: Fluticasone-Umeclidin-Vilant Inhale 1 puff into the lungs daily.       Allergies: No Known Allergies  Family History: Family History  Problem Relation Age of Onset  . Cancer Father        type unknown  . Breast cancer Neg Hx   . Prostate cancer Neg Hx   . Colon cancer Neg Hx   . Pancreatic cancer Neg Hx     Social History:  reports that he quit smoking about 11 years ago. His smoking use included cigarettes. He has a 80.00 pack-year smoking history. He has never used smokeless tobacco. He reports current alcohol use. He reports that he does not use drugs.  ROS: All other review of systems were reviewed and are negative except what is noted above in HPI  Physical Exam: BP (!) 92/59   Pulse 79   Constitutional:  Alert and oriented, No acute distress. HEENT: [place] AT, moist mucus membranes.  Trachea midline, no masses. Cardiovascular: No clubbing, cyanosis, or edema. Respiratory: Normal respiratory effort, no increased work of breathing. GI: Abdomen is soft, nontender, nondistended, no abdominal masses GU: No CVA tenderness.  Lymph: No cervical or inguinal lymphadenopathy. Skin: No rashes, bruises or suspicious lesions. Neurologic: Grossly intact, no focal deficits, moving all 4 extremities. Psychiatric: Normal mood and affect.  Laboratory Data: Lab Results  Component Value Date   WBC 7.9 10/11/2019   HGB 13.2 10/11/2019   HCT 41.0 10/11/2019   MCV 93.2 10/11/2019   PLT 276 10/11/2019    Lab Results  Component Value Date   CREATININE 1.06 10/11/2019    No results found for: PSA  No results found for: TESTOSTERONE  No results found for: HGBA1C  Urinalysis No results found for: COLORURINE, APPEARANCEUR, LABSPEC, PHURINE, GLUCOSEU, HGBUR, BILIRUBINUR, KETONESUR, PROTEINUR, UROBILINOGEN, NITRITE, LEUKOCYTESUR  No results found for: LABMICR, [place],  RBCUA, LABEPIT, MUCUS, BACTERIA  Pertinent Imaging:  No results found for this or any previous visit.  No results found for this or any previous visit.  No results found for this or any previous visit.  No results found for this or any previous visit.  No results found for this or any previous visit.  No results found for this or any previous visit.  No results found for this or any previous visit.  No results found for this or any previous visit.   Assessment & Plan:    1. Nocturia We will start uroxatral 10mg   - BLADDER SCAN AMB NON-IMAGING   Return in about 6 years (around 11/01/2025).  Nicolette Bang, MD  [facility] [place]

## 2019-11-02 NOTE — Progress Notes (Signed)
°  Radiation Oncology         [phone] ________________________________  Name: Bryan Hamilton MRN: [id number]  Date: 11/02/2019  DOB: [birth year]  COMPLEX SIMULATION NOTE  NARRATIVE:  The patient was brought to the [facility] suite today following prostate seed implantation approximately one month ago.  Identity was confirmed.  All relevant records and images related to the planned course of therapy were reviewed.  Then, the patient was set-up supine.  CT images were obtained.  The CT images were loaded into the planning software.  Then the prostate and rectum were contoured.  Treatment planning then occurred.  The implanted iodine 125 seeds were identified by the physics staff for projection of radiation distribution  I have requested : 3D Simulation  I have requested a DVH of the following structures: Prostate and rectum.    ________________________________  Sheral Apley Tammi Klippel, M.D.

## 2019-11-02 NOTE — Progress Notes (Signed)
Received patient in the clinic following post seed CT scan. Pre seed IPSS 9. Post seed IPSS 25. Denies dysuria or hematuria. Reports urinary leakage "that seems to be getting better." Denies any bowel complaints. Reports his urinary catheter was removed five days after his seeds were implanted. Patient scheduled to follow up at [facility] later today.   BP 98/67   Pulse 60   Temp (!) 97.3 F (36.3 C) (Tympanic)   Resp 18   Ht 5\' 8"  (1.727 m)   Wt 202 lb 12.8 oz (92 kg)   SpO2 93%   BMI 30.84 kg/m  Wt Readings from Last 3 Encounters:  11/02/19 202 lb 12.8 oz (92 kg)  10/14/19 207 lb 8 oz (94.1 kg)  10/13/19 195 lb (88.5 kg)

## 2019-11-02 NOTE — Patient Instructions (Signed)

## 2019-11-02 NOTE — Progress Notes (Signed)
Urological Symptom Review  Patient is experiencing the following symptoms: Frequent urination Hard to postpone urination Get up at night to urinate Leakage of urine Stream starts and stops Weak stream   Review of Systems  Gastrointestinal (upper)  : Indigestion/heartburn  Gastrointestinal (lower) : Negative for lower GI symptoms  Constitutional : Fatigue  Skin: Negative for skin symptoms  Eyes: Negative for eye symptoms  Ear/Nose/Throat : Sinus problems  Hematologic/Lymphatic: Easy bruising  Cardiovascular : Leg swelling  Respiratory : Shortness of breath  Endocrine: Negative for endocrine symptoms  Musculoskeletal: Back pain Joint pain  Neurological: Headaches  Psychologic: Depression Anxiety

## 2019-11-04 ENCOUNTER — Encounter: Payer: Self-pay | Admitting: Medical Oncology

## 2019-11-04 DIAGNOSIS — M25551 Pain in right hip: Secondary | ICD-10-CM | POA: Diagnosis not present

## 2019-11-04 DIAGNOSIS — Z23 Encounter for immunization: Secondary | ICD-10-CM | POA: Diagnosis not present

## 2019-11-04 DIAGNOSIS — I480 Paroxysmal atrial fibrillation: Secondary | ICD-10-CM | POA: Diagnosis not present

## 2019-11-04 DIAGNOSIS — I10 Essential (primary) hypertension: Secondary | ICD-10-CM | POA: Diagnosis not present

## 2019-11-04 DIAGNOSIS — J060 Acute laryngopharyngitis: Secondary | ICD-10-CM | POA: Diagnosis not present

## 2019-11-04 DIAGNOSIS — R7301 Impaired fasting glucose: Secondary | ICD-10-CM | POA: Diagnosis not present

## 2019-11-08 ENCOUNTER — Encounter: Payer: Self-pay | Admitting: Urology

## 2019-11-09 DIAGNOSIS — E782 Mixed hyperlipidemia: Secondary | ICD-10-CM | POA: Diagnosis not present

## 2019-11-09 DIAGNOSIS — E1169 Type 2 diabetes mellitus with other specified complication: Secondary | ICD-10-CM | POA: Diagnosis not present

## 2019-11-09 DIAGNOSIS — I10 Essential (primary) hypertension: Secondary | ICD-10-CM | POA: Diagnosis not present

## 2019-11-09 DIAGNOSIS — J449 Chronic obstructive pulmonary disease, unspecified: Secondary | ICD-10-CM | POA: Diagnosis not present

## 2019-11-09 DIAGNOSIS — R945 Abnormal results of liver function studies: Secondary | ICD-10-CM | POA: Diagnosis not present

## 2019-11-09 DIAGNOSIS — Z0000 Encounter for general adult medical examination without abnormal findings: Secondary | ICD-10-CM | POA: Diagnosis not present

## 2019-11-14 ENCOUNTER — Ambulatory Visit: Payer: Medicare Other | Admitting: Physician Assistant

## 2019-11-22 DIAGNOSIS — J0110 Acute frontal sinusitis, unspecified: Secondary | ICD-10-CM | POA: Diagnosis not present

## 2019-11-22 DIAGNOSIS — J449 Chronic obstructive pulmonary disease, unspecified: Secondary | ICD-10-CM | POA: Diagnosis not present

## 2019-11-23 ENCOUNTER — Other Ambulatory Visit: Payer: Self-pay | Admitting: Physician Assistant

## 2019-11-23 ENCOUNTER — Ambulatory Visit
Admission: RE | Admit: 2019-11-23 | Discharge: 2019-11-23 | Disposition: A | Discharge status: Home / Self Care | Payer: Medicare Other | Source: Ambulatory Visit | Attending: Radiation Oncology | Admitting: Radiation Oncology

## 2019-11-23 ENCOUNTER — Encounter: Payer: Self-pay | Admitting: Physician Assistant

## 2019-11-23 ENCOUNTER — Encounter: Payer: Self-pay | Admitting: Radiation Oncology

## 2019-11-23 DIAGNOSIS — I10 Essential (primary) hypertension: Secondary | ICD-10-CM

## 2019-11-23 DIAGNOSIS — I4891 Unspecified atrial fibrillation: Secondary | ICD-10-CM

## 2019-11-23 DIAGNOSIS — C61 Malignant neoplasm of prostate: Secondary | ICD-10-CM

## 2019-11-23 DIAGNOSIS — J449 Chronic obstructive pulmonary disease, unspecified: Secondary | ICD-10-CM

## 2019-11-23 DIAGNOSIS — U071 COVID-19: Secondary | ICD-10-CM

## 2019-11-23 NOTE — Progress Notes (Signed)
I connected by phone with Bryan Hamilton on 11/23/2019 at 10:28 AM to discuss the potential use of a new treatment for mild to moderate COVID-19 viral infection in non-hospitalized patients.  This patient is a 74 y.o. male that meets the FDA criteria for Emergency Use Authorization of COVID monoclonal antibody sotrovimab, casirivimab/imdevimab or bamlamivimab/estevimab.  Has a (+) direct SARS-CoV-2 viral test result  Has mild or moderate COVID-19   Is NOT hospitalized due to COVID-19  Is within 10 days of symptom onset  Has at least one of the high risk factor(s) for progression to severe COVID-19 and/or hospitalization as defined in EUA.  Specific high risk criteria : Older age (>/= 76 yo), BMI > 25, Diabetes, Cardiovascular disease or hypertension and Chronic Lung Disease   I have spoken and communicated the following to the patient or parent/caregiver regarding COVID monoclonal antibody treatment:  1. FDA has authorized the emergency use for the treatment of mild to moderate COVID-19 in adults and pediatric patients with positive results of direct SARS-CoV-2 viral testing who are 8 years of age and older weighing at least 40 kg, and who are at high risk for progressing to severe COVID-19 and/or hospitalization.  2. The significant known and potential risks and benefits of COVID monoclonal antibody, and the extent to which such potential risks and benefits are unknown.  3. Information on available alternative treatments and the risks and benefits of those alternatives, including clinical trials.  4. Patients treated with COVID monoclonal antibody should continue to self-isolate and use infection control measures (e.g., wear mask, isolate, social distance, avoid sharing personal items, clean and disinfect "high touch" surfaces, and frequent handwashing) according to CDC guidelines.   5. The patient or parent/caregiver has the option to accept or refuse COVID monoclonal antibody  treatment.  After reviewing this information with the patient, the patient has agreed to receive one of the available covid 19 monoclonal antibodies and will be provided an appropriate fact sheet prior to infusion.  Sx onset 10/29. Set up for infusion on 11/4 @ 10:30am with transportation. Directions given to [facility]. Pt is aware that insurance will be charged an infusion fee. Pt is fully vaccinated.   Angelena Form 11/23/2019 10:28 AM

## 2019-11-24 ENCOUNTER — Ambulatory Visit (HOSPITAL_COMMUNITY)
Admission: RE | Admit: 2019-11-24 | Discharge: 2019-11-24 | Disposition: A | Discharge status: Home / Self Care | Payer: Medicare Other | Source: Ambulatory Visit | Attending: Pulmonary Disease | Admitting: Pulmonary Disease

## 2019-11-24 DIAGNOSIS — I10 Essential (primary) hypertension: Secondary | ICD-10-CM | POA: Insufficient documentation

## 2019-11-24 DIAGNOSIS — Z23 Encounter for immunization: Secondary | ICD-10-CM | POA: Insufficient documentation

## 2019-11-24 DIAGNOSIS — I4891 Unspecified atrial fibrillation: Secondary | ICD-10-CM | POA: Diagnosis not present

## 2019-11-24 DIAGNOSIS — J449 Chronic obstructive pulmonary disease, unspecified: Secondary | ICD-10-CM | POA: Insufficient documentation

## 2019-11-24 DIAGNOSIS — U071 COVID-19: Secondary | ICD-10-CM | POA: Diagnosis present

## 2019-11-24 DIAGNOSIS — C61 Malignant neoplasm of prostate: Secondary | ICD-10-CM | POA: Diagnosis not present

## 2019-11-24 MED ORDER — METHYLPREDNISOLONE SODIUM SUCC 125 MG IJ SOLR: 125.0000 mg | Freq: Once | INTRAMUSCULAR | Status: DC | PRN

## 2019-11-24 MED ORDER — EPINEPHRINE 0.3 MG/0.3ML IJ SOAJ: 0.3000 mg | Freq: Once | INTRAMUSCULAR | Status: DC | PRN

## 2019-11-24 MED ORDER — ALBUTEROL SULFATE HFA 108 (90 BASE) MCG/ACT IN AERS: 2.0000 | INHALATION_SPRAY | Freq: Once | RESPIRATORY_TRACT | Status: DC | PRN

## 2019-11-24 MED ORDER — FAMOTIDINE IN NACL 20-0.9 MG/50ML-% IV SOLN: 20.0000 mg | Freq: Once | INTRAVENOUS | Status: DC | PRN

## 2019-11-24 MED ORDER — DIPHENHYDRAMINE HCL 50 MG/ML IJ SOLN: 50.0000 mg | Freq: Once | INTRAMUSCULAR | Status: DC | PRN

## 2019-11-24 MED ORDER — SODIUM CHLORIDE 0.9 % IV SOLN: INTRAVENOUS | Status: DC | PRN

## 2019-11-24 MED ORDER — SOTROVIMAB 500 MG/8ML IV SOLN
500.0000 mg | Freq: Once | INTRAVENOUS | Status: AC
  Administered 2019-11-24: 500 mg via INTRAVENOUS

## 2019-11-24 NOTE — Progress Notes (Signed)
  Diagnosis: COVID-19  Physician:Dr Joya Gaskins   Procedure: Covid Infusion Clinic Med: Sotrovimab Patient provided with Sotrovimab fact sheet for patients, parents, and caregivers prior to infusion,   Complications: No immediate complications noted.  Discharge: Discharged home   Dewaine Oats 11/24/2019

## 2019-11-24 NOTE — Discharge Instructions (Signed)

## 2019-12-09 DIAGNOSIS — E1169 Type 2 diabetes mellitus with other specified complication: Secondary | ICD-10-CM | POA: Diagnosis not present

## 2019-12-09 DIAGNOSIS — E7849 Other hyperlipidemia: Secondary | ICD-10-CM | POA: Diagnosis not present

## 2019-12-09 DIAGNOSIS — I10 Essential (primary) hypertension: Secondary | ICD-10-CM | POA: Diagnosis not present

## 2019-12-09 DIAGNOSIS — I480 Paroxysmal atrial fibrillation: Secondary | ICD-10-CM | POA: Diagnosis not present

## 2019-12-21 ENCOUNTER — Ambulatory Visit: Payer: Medicare Other | Admitting: Urology

## 2019-12-26 NOTE — Progress Notes (Signed)
  Radiation Oncology         [phone] ________________________________  Name: Bryan Hamilton MRN: [id number]  Date: 11/23/2019  DOB: 04-16-1945  3D Planning Note   Prostate Brachytherapy Post-Implant Dosimetry  Diagnosis: 74 y.o. gentleman with Stage T1c adenocarcinoma of the prostate with Gleason score of 4+3, and PSA of 16.9.  Narrative: On a previous date, Bryan Hamilton returned following prostate seed implantation for post implant planning. He underwent CT scan complex simulation to delineate the three-dimensional structures of the pelvis and demonstrate the radiation distribution.  Since that time, the seed localization, and complex isodose planning with dose volume histograms have now been completed.  Results:   Prostate Coverage - The dose of radiation delivered to the 90% or more of the prostate gland (D90) was 85.72% of the prescription dose. This approaches our goal of greater than 90%.  The bilateral posterolateral coverage is excellent over the peripheral zone.  The only area with lower coverage is anterior centrally for urethral sparing. Rectal Sparing - The volume of rectal tissue receiving the prescription dose or higher was 0.0 cc. This falls under our thresholds tolerance of 1.0 cc.  Impression: The prostate seed implant appears to show adequate target coverage and appropriate rectal sparing.  Plan:  The patient will continue to follow with urology for ongoing PSA determinations. I would anticipate a high likelihood for local tumor control with minimal risk for rectal morbidity.  ________________________________  Sheral Apley Tammi Klippel, M.D.

## 2020-01-20 DIAGNOSIS — I480 Paroxysmal atrial fibrillation: Secondary | ICD-10-CM | POA: Diagnosis not present

## 2020-01-20 DIAGNOSIS — E1169 Type 2 diabetes mellitus with other specified complication: Secondary | ICD-10-CM | POA: Diagnosis not present

## 2020-01-20 DIAGNOSIS — I10 Essential (primary) hypertension: Secondary | ICD-10-CM | POA: Diagnosis not present

## 2020-01-20 DIAGNOSIS — E7849 Other hyperlipidemia: Secondary | ICD-10-CM | POA: Diagnosis not present

## 2020-02-18 DIAGNOSIS — E1169 Type 2 diabetes mellitus with other specified complication: Secondary | ICD-10-CM | POA: Diagnosis not present

## 2020-02-18 DIAGNOSIS — I480 Paroxysmal atrial fibrillation: Secondary | ICD-10-CM | POA: Diagnosis not present

## 2020-02-18 DIAGNOSIS — E7849 Other hyperlipidemia: Secondary | ICD-10-CM | POA: Diagnosis not present

## 2020-02-18 DIAGNOSIS — I10 Essential (primary) hypertension: Secondary | ICD-10-CM | POA: Diagnosis not present

## 2020-03-19 DIAGNOSIS — E1169 Type 2 diabetes mellitus with other specified complication: Secondary | ICD-10-CM | POA: Diagnosis not present

## 2020-03-19 DIAGNOSIS — I10 Essential (primary) hypertension: Secondary | ICD-10-CM | POA: Diagnosis not present

## 2020-03-19 DIAGNOSIS — I480 Paroxysmal atrial fibrillation: Secondary | ICD-10-CM | POA: Diagnosis not present

## 2020-03-19 DIAGNOSIS — E7849 Other hyperlipidemia: Secondary | ICD-10-CM | POA: Diagnosis not present

## 2020-04-18 DIAGNOSIS — I10 Essential (primary) hypertension: Secondary | ICD-10-CM | POA: Diagnosis not present

## 2020-04-18 DIAGNOSIS — E7849 Other hyperlipidemia: Secondary | ICD-10-CM | POA: Diagnosis not present

## 2020-04-18 DIAGNOSIS — E1169 Type 2 diabetes mellitus with other specified complication: Secondary | ICD-10-CM | POA: Diagnosis not present

## 2020-04-18 DIAGNOSIS — I480 Paroxysmal atrial fibrillation: Secondary | ICD-10-CM | POA: Diagnosis not present

## 2020-05-09 ENCOUNTER — Other Ambulatory Visit: Payer: Self-pay

## 2020-05-09 MED ORDER — DILTIAZEM HCL ER COATED BEADS 300 MG PO CP24
300.0000 mg | ORAL_CAPSULE | Freq: Every day | ORAL | 0 refills | Status: DC
Start: 2020-05-09 — End: 2020-08-01

## 2020-05-17 ENCOUNTER — Telehealth: Payer: Self-pay | Admitting: Interventional Cardiology

## 2020-05-17 NOTE — Telephone Encounter (Signed)
New message    Daughter called she said that her family has callled her and said that her dad is having sob , looks swollen, his skin has a grey tint and he is very tired.   She wants an appointment for next week when she will be in town .

## 2020-05-17 NOTE — Telephone Encounter (Signed)
Left detailed message on dtr phone informing her of Dr. Hassell Done comment. Advised to call office on Monday and let us know how dad is doing ([facility]'s nurse won't return until Tuesday)

## 2020-05-17 NOTE — Telephone Encounter (Signed)
Returned call to dtr who reports "dad seems not the same recently".   Dtr lives 4 hours away but has had 3 different family members tell her dad doesn't look good.  Pt acts like he is fine everyday and he won't tell her differently.  She states he will not go to ED and he won't go to a doctors appt w/o her. Family reports pt as grayish and face is swollen, worsening sob, "more difficultly catching breath". Unknown of other symptoms or if any weight gain.  Dtr just got out of hospital (septic, torn ligament, ?? recurrent lymphoma) and will not be able to come into town until Saturday.  She will be in town all week next week and would like pt seen) Advised pt should be seen today, but pt refuses to go anywhere. Advised dtr that when she gets there Saturday to try and get him somewhere then (PCP, urgent care, ed). Advised to get appt w/ PCP while here next week -- pt scheduled for Wednesday. Aware I will forward to Dr. Irish Lack to see if somewhere to work pt in on his schedule next week as the office has no openings (besides DOD spots)..... (if unable to get in w/ [place] can schedule pt in DOD spot).

## 2020-05-19 DIAGNOSIS — I5033 Acute on chronic diastolic (congestive) heart failure: Secondary | ICD-10-CM | POA: Diagnosis not present

## 2020-05-19 DIAGNOSIS — R0602 Shortness of breath: Secondary | ICD-10-CM | POA: Diagnosis not present

## 2020-05-19 DIAGNOSIS — I482 Chronic atrial fibrillation, unspecified: Secondary | ICD-10-CM | POA: Diagnosis not present

## 2020-05-19 DIAGNOSIS — R531 Weakness: Secondary | ICD-10-CM | POA: Diagnosis not present

## 2020-05-19 DIAGNOSIS — J449 Chronic obstructive pulmonary disease, unspecified: Secondary | ICD-10-CM | POA: Diagnosis not present

## 2020-05-21 DIAGNOSIS — R944 Abnormal results of kidney function studies: Secondary | ICD-10-CM | POA: Diagnosis not present

## 2020-05-21 DIAGNOSIS — E1169 Type 2 diabetes mellitus with other specified complication: Secondary | ICD-10-CM | POA: Diagnosis not present

## 2020-05-21 DIAGNOSIS — I110 Hypertensive heart disease with heart failure: Secondary | ICD-10-CM | POA: Diagnosis not present

## 2020-05-21 DIAGNOSIS — E7849 Other hyperlipidemia: Secondary | ICD-10-CM | POA: Diagnosis not present

## 2020-05-21 DIAGNOSIS — J060 Acute laryngopharyngitis: Secondary | ICD-10-CM | POA: Diagnosis not present

## 2020-05-21 DIAGNOSIS — Z23 Encounter for immunization: Secondary | ICD-10-CM | POA: Diagnosis not present

## 2020-05-21 DIAGNOSIS — E782 Mixed hyperlipidemia: Secondary | ICD-10-CM | POA: Diagnosis not present

## 2020-05-22 NOTE — Telephone Encounter (Signed)
I spoke with patient's daughter.  She reports patient saw PCP on Saturday. Patient had swelling in feet and legs.  It was determined he had not been taking lisinopril and lasix correctly. Was not keeping legs elevated.  Medications resumed correctly and patient will follow up with PCP tomorrow. Patient is due for follow up with Dr Irish Lack.  Appointment made for May 27,2022 at 9:40

## 2020-05-22 NOTE — Telephone Encounter (Signed)
Follow up:    Patient daughter calling to speak with patient nurse concerning what is going on with patient from the PCP. Patient daughter stated that the nurse wanted her to call and make a report.

## 2020-05-23 DIAGNOSIS — I480 Paroxysmal atrial fibrillation: Secondary | ICD-10-CM | POA: Diagnosis not present

## 2020-05-23 DIAGNOSIS — R0602 Shortness of breath: Secondary | ICD-10-CM | POA: Diagnosis not present

## 2020-05-23 DIAGNOSIS — E1165 Type 2 diabetes mellitus with hyperglycemia: Secondary | ICD-10-CM | POA: Diagnosis not present

## 2020-05-23 DIAGNOSIS — J449 Chronic obstructive pulmonary disease, unspecified: Secondary | ICD-10-CM | POA: Diagnosis not present

## 2020-05-23 DIAGNOSIS — E8770 Fluid overload, unspecified: Secondary | ICD-10-CM | POA: Diagnosis not present

## 2020-05-23 DIAGNOSIS — I10 Essential (primary) hypertension: Secondary | ICD-10-CM | POA: Diagnosis not present

## 2020-05-23 DIAGNOSIS — R0600 Dyspnea, unspecified: Secondary | ICD-10-CM | POA: Diagnosis not present

## 2020-05-23 DIAGNOSIS — E782 Mixed hyperlipidemia: Secondary | ICD-10-CM | POA: Diagnosis not present

## 2020-05-23 DIAGNOSIS — R600 Localized edema: Secondary | ICD-10-CM | POA: Diagnosis not present

## 2020-05-30 ENCOUNTER — Other Ambulatory Visit: Payer: Self-pay | Admitting: Interventional Cardiology

## 2020-05-30 DIAGNOSIS — E1165 Type 2 diabetes mellitus with hyperglycemia: Secondary | ICD-10-CM | POA: Diagnosis not present

## 2020-05-30 DIAGNOSIS — J449 Chronic obstructive pulmonary disease, unspecified: Secondary | ICD-10-CM | POA: Diagnosis not present

## 2020-05-30 DIAGNOSIS — I10 Essential (primary) hypertension: Secondary | ICD-10-CM | POA: Diagnosis not present

## 2020-06-08 DIAGNOSIS — I10 Essential (primary) hypertension: Secondary | ICD-10-CM | POA: Diagnosis not present

## 2020-06-14 NOTE — Progress Notes (Signed)
Cardiology Office Note   Date:  06/15/2020   ID:  Bryan Hamilton, DOB 12/12/1945, MRN [id number]  PCP:  Celene Squibb, MD    No chief complaint on file.  AFib  Wt Readings from Last 3 Encounters:  06/15/20 212 lb (96.2 kg)  11/02/19 202 lb 12.8 oz (92 kg)  10/14/19 207 lb 8 oz (94.1 kg)       History of Present Illness: Bryan Hamilton is a 75 y.o. male   Who was previously seen by Dr. Bronson Ing for AFib.  No h/o CAD.  Records showed: "The patient presents for posthospitalization follow-up for rapid atrial fibrillation. I consulted on him on 05/13/2019.  He is here with his daughter, Bryan Hamilton would also like to be contacted with any information regarding her father's health [phone]).She plans to move here from [facility] to help take care of her father.Hehas another daughter who lives in [place] with her family.That daughter has atrial fibrillation and recently underwent cardioversion."  He has chronic SHOB from COPD.  He was started on ELiquis in 4/21.  Rate control with metoprolol 50 mg BID and Cardizem 240 mg CD.  HR was 108 on this regimen in May 2021 so Cardizem was increased to 300 mg daily.   Echo in 2021 April showed: "Left ventricular ejection fraction, by estimation, is 60 to 65%. The  left ventricle has normal function. Left ventricular endocardial border  not optimally defined to evaluate regional wall motion. There is mild left  ventricular hypertrophy. Left  ventricular diastolic parameters are indeterminate.  2. Right ventricular systolic function is normal. The right ventricular  size is mildly enlarged. There is moderately elevated pulmonary artery  systolic pressure.  3. Left atrial size was mildly dilated.  4. The mitral valve is normal in structure. No evidence of mitral valve  regurgitation. No evidence of mitral stenosis.  5. The aortic valve is tricuspid. Aortic valve regurgitation is not  visualized. No aortic stenosis  is present.  6. The inferior vena cava is normal in size with greater than 50%  respiratory variability, suggesting right atrial pressure of 3 mmHg. "    Past Medical History:  Diagnosis Date  . Anxiety   . Arrhythmia   . Arthritis   . Asthma   . Atrial fibrillation ([place])   . COPD (chronic obstructive pulmonary disease) ([place])   . Depression   . Diabetes mellitus without complication ([place])    tpe 2   . Dysrhythmia    afib   . GERD (gastroesophageal reflux disease)   . Hyperchloremia   . Hypertension   . PONV (postoperative nausea and vomiting)   . Prostate cancer [facility])     Past Surgical History:  Procedure Laterality Date  . BACK SURGERY    . CYSTOSCOPY N/A 10/14/2019   Procedure: CYSTOSCOPY FLEXIBLE;  Surgeon: Cleon Gustin, MD;  Location[facility];  Service: Urology;  Laterality: N/A;  NO SEEDS FOUND IN BLADDER  . PROSTATE BIOPSY N/A 07/20/2019   Procedure: PROSTATE BIOPSY;  Surgeon: Cleon Gustin, MD;  Location: [facility];  Service: Urology;  Laterality: N/A;  . RADIOACTIVE SEED IMPLANT N/A 10/14/2019   Procedure: RADIOACTIVE SEED IMPLANT/BRACHYTHERAPY IMPLANT;  Surgeon: Cleon Gustin, MD;  Location: [facility];  Service: Urology;  Laterality: N/A;    69  SEEDS IMPLANTED  . SPACE OAR INSTILLATION N/A 10/14/2019   Procedure: SPACE OAR INSTILLATION;  Surgeon: Cleon Gustin, MD;  Location:  [place];  Service: Urology;  Laterality: N/A;     Current Outpatient Medications  Medication Sig Dispense Refill  . acetaminophen (TYLENOL) 650 MG CR tablet Take 1,300 mg by mouth 2 (two) times daily as needed for pain.    Marland Kitchen alfuzosin (UROXATRAL) 10 MG 24 hr tablet Take 1 tablet (10 mg total) by mouth daily with breakfast. 30 tablet 11  . ALPRAZolam (XANAX) 0.25 MG tablet Take 0.25 mg by mouth daily as needed for anxiety.     Marland Kitchen apixaban (ELIQUIS) 5 MG TABS tablet Take 1 tablet (5 mg total) by mouth 2 (two) times  daily. 60 tablet 11  . atorvastatin (LIPITOR) 20 MG tablet Take 20 mg by mouth at bedtime.     . diclofenac Sodium (VOLTAREN) 1 % GEL Apply 2 g topically daily as needed (Pain).    Marland Kitchen diltiazem (CARDIZEM CD) 300 MG 24 hr capsule Take 1 capsule (300 mg total) by mouth daily. Please make yearly appt with Dr. Irish Lack for August 2022 for future refills. Thank you 1st attempt 90 capsule 0  . metFORMIN (GLUCOPHAGE-XR) 500 MG 24 hr tablet Take 500 mg by mouth daily.    . metoprolol tartrate (LOPRESSOR) 50 MG tablet Take 50 mg by mouth 2 (two) times daily.    . Omega-3 Fatty Acids (OMEGA 3 PO) Take 4 capsules by mouth daily. Last dose on 09/29/19    . Polyethyl Glycol-Propyl Glycol (SYSTANE) 0.4-0.3 % SOLN Place 1-2 drops into both eyes daily as needed (Dry eye).     Marland Kitchen PROAIR HFA 108 (90 Base) MCG/ACT inhaler Inhale 2 puffs into the lungs every 6 (six) hours as needed for wheezing or shortness of breath.     . sertraline (ZOLOFT) 25 MG tablet Take 25 mg by mouth at bedtime.     . traMADol (ULTRAM) 50 MG tablet Take 1 tablet (50 mg total) by mouth every 6 (six) hours as needed for moderate pain. 12 tablet 0  . TRELEGY ELLIPTA 100-62.5-25 MCG/INH AEPB Inhale 1 puff into the lungs daily.    . furosemide (LASIX) 40 MG tablet Take 1 tablet (40 mg total) by mouth 2 (two) times daily as needed. 180 tablet 1   No current facility-administered medications for this visit.    Allergies:   Patient has no known allergies.    Social History:  The patient  reports that he quit smoking about 12 years ago. His smoking use included cigarettes. He has a 80.00 pack-year smoking history. He has never used smokeless tobacco. He reports current alcohol use. He reports that he does not use drugs.   Family History:  The patient's family history includes Cancer in his father.    ROS:  Please see the history of present illness.   Otherwise, review of systems are positive for increased edema at times.   All other systems are  reviewed and negative.    PHYSICAL EXAM: VS:  BP 110/62   Pulse 62   Ht 5\' 8"  (1.727 m)   Wt 212 lb (96.2 kg)   SpO2 93%   BMI 32.23 kg/m  , BMI Body mass index is 32.23 kg/m. GEN: Well nourished, well developed, in no acute distress  HEENT: normal  Neck: no JVD, carotid bruits, or masses Cardiac: irregularly irregular; no murmurs, rubs, or gallops,: 1+ pitting edema  Respiratory:  Mild wheezing to auscultation bilaterally, normal work of breathing GI: soft, nontender, nondistended, + BS MS: no deformity or atrophy  Skin: warm and  dry, no rash Neuro:  Strength and sensation are intact Psych: euthymic mood, full affect   EKG:   The ekg ordered today demonstrates AFib, rate controlled   Recent Labs: 10/11/2019: ALT 15; BUN 19; Creatinine, Ser 1.06; Hemoglobin 13.2; Platelets 276; Potassium 4.9; Sodium 135   Lipid Panel No results found for: CHOL, TRIG, HDL, CHOLHDL, VLDL, LDLCALC, LDLDIRECT   Other studies Reviewed: Additional studies/ records that were reviewed today with results demonstrating: Stable renal function and potassium from 2021.   ASSESSMENT AND PLAN:    1. AFib: rate control.  Eliquis for stroke prevention. No palpitations or bleding issues.  2. HTN: The current medical regimen is effective;  continue present plan and medications.  Whole food, plant based diet recommended.  3. Chronic diastolic heart failure: Increase Lasix 40 mg BID prn.  I explained the scenario is when he would take an extra furosemide tablet.  He does weigh himself daily.  He will watch for worsening lower extremity edema or shortness of breath.  Elevate legs as well to help with edema.  Low-salt diet.  Avoid processed foods. 4. Hyperlipidemia: The current medical regimen is effective;  continue present plan and medications. 5. COPD: Continue inhalers.   Current medicines are reviewed at length with the patient today.  The patient concerns regarding his medicines were addressed.  The  following changes have been made:  No change  Labs/ tests ordered today include:   Orders Placed This Encounter  Procedures  . EKG 12-Lead    Recommend 150 minutes/week of aerobic exercise Low fat, low carb, high fiber diet recommended  Disposition:   FU in 6 months   Signed, Larae Grooms, MD  06/15/2020 10:52 AM    [facility] [place], [place], [place]  [address] Phone: [phone]; Fax: [phone]

## 2020-06-15 ENCOUNTER — Encounter: Payer: Self-pay | Admitting: Interventional Cardiology

## 2020-06-15 ENCOUNTER — Other Ambulatory Visit: Payer: Self-pay

## 2020-06-15 ENCOUNTER — Ambulatory Visit: Payer: Medicare Other | Admitting: Interventional Cardiology

## 2020-06-15 VITALS — BP 110/62 | HR 62 | Ht 68.0 in | Wt 212.0 lb

## 2020-06-15 DIAGNOSIS — E782 Mixed hyperlipidemia: Secondary | ICD-10-CM | POA: Diagnosis not present

## 2020-06-15 DIAGNOSIS — I10 Essential (primary) hypertension: Secondary | ICD-10-CM | POA: Diagnosis not present

## 2020-06-15 DIAGNOSIS — I4891 Unspecified atrial fibrillation: Secondary | ICD-10-CM | POA: Diagnosis not present

## 2020-06-15 DIAGNOSIS — I5032 Chronic diastolic (congestive) heart failure: Secondary | ICD-10-CM | POA: Diagnosis not present

## 2020-06-15 MED ORDER — FUROSEMIDE 40 MG PO TABS
40.0000 mg | ORAL_TABLET | Freq: Two times a day (BID) | ORAL | 1 refills | Status: DC | PRN
Start: 2020-06-15 — End: 2021-01-16

## 2020-06-15 NOTE — Patient Instructions (Signed)
Medication Instructions:  1) CHANGE Furosemide to 40mg  twice daily as needed  *If you need a refill on your cardiac medications before your next appointment, please call your pharmacy*   Lab Work: None If you have labs (blood work) drawn today and your tests are completely normal, you will receive your results only by: Marland Kitchen MyChart Message (if you have MyChart) OR . A paper copy in the mail If you have any lab test that is abnormal or we need to change your treatment, we will call you to review the results.   Testing/Procedures: None   Follow-Up: At [facility], you and your health needs are our priority.  As part of our continuing mission to provide you with exceptional heart care, we have created designated Provider Care Teams.  These Care Teams include your primary Cardiologist (physician) and Advanced Practice Providers (APPs -  Physician Assistants and Nurse Practitioners) who all work together to provide you with the care you need, when you need it.  We recommend signing up for the patient portal called "MyChart".  Sign up information is provided on this After Visit Summary.  MyChart is used to connect with patients for Virtual Visits (Telemedicine).  Patients are able to view lab/test results, encounter notes, upcoming appointments, etc.  Non-urgent messages can be sent to your provider as well.   To learn more about what you can do with MyChart, go to [email or web address].    Your next appointment:   6 month(s)  The format for your next appointment:   In Person  Provider:   You may see Larae Grooms, MD or one of the following Advanced Practice Providers on your designated Care Team:    Melina Copa, PA-C  Ermalinda Barrios, PA-C    Other Instructions   High-Fiber Eating Plan Fiber, also called dietary fiber, is a type of carbohydrate. It is found foods such as fruits, vegetables, whole grains, and beans. A high-fiber diet can have many health benefits. Your health  care provider may recommend a high-fiber diet to help:  Prevent constipation. Fiber can make your bowel movements more regular.  Lower your cholesterol.  Relieve the following conditions: ? Inflammation of veins in the anus (hemorrhoids). ? Inflammation of specific areas of the digestive tract (uncomplicated diverticulosis). ? A problem of the large intestine, also called the colon, that sometimes causes pain and diarrhea (irritable bowel syndrome, or IBS).  Prevent overeating as part of a weight-loss plan.  Prevent heart disease, type 2 diabetes, and certain cancers. What are tips for following this plan? Reading food labels  Check the nutrition facts label on food products for the amount of dietary fiber. Choose foods that have 5 grams of fiber or more per serving.  The goals for recommended daily fiber intake include: ? Men (age 41 or younger): 34-38 g. ? Men (over age 55): 28-34 g. ? Women (age 50 or younger): 25-28 g. ? Women (over age 34): 22-25 g. Your daily fiber goal is _____________ g.   Shopping  Choose whole fruits and vegetables instead of processed forms, such as apple juice or applesauce.  Choose a wide variety of high-fiber foods such as avocados, lentils, oats, and kidney beans.  Read the nutrition facts label of the foods you choose. Be aware of foods with added fiber. These foods often have high sugar and sodium amounts per serving. Cooking  Use whole-grain flour for baking and cooking.  Cook with brown rice instead of white rice. Meal planning  Start the  day with a breakfast that is high in fiber, such as a cereal that contains 5 g of fiber or more per serving.  Eat breads and cereals that are made with whole-grain flour instead of refined flour or white flour.  Eat brown rice, bulgur wheat, or millet instead of white rice.  Use beans in place of meat in soups, salads, and pasta dishes.  Be sure that half of the grains you eat each day are whole  grains. General information  You can get the recommended daily intake of dietary fiber by: ? Eating a variety of fruits, vegetables, grains, nuts, and beans. ? Taking a fiber supplement if you are not able to take in enough fiber in your diet. It is better to get fiber through food than from a supplement.  Gradually increase how much fiber you consume. If you increase your intake of dietary fiber too quickly, you may have bloating, cramping, or gas.  Drink plenty of water to help you digest fiber.  Choose high-fiber snacks, such as berries, raw vegetables, nuts, and popcorn. What foods should I eat? Fruits Berries. Pears. Apples. Oranges. Avocado. Prunes and raisins. Dried figs. Vegetables Sweet potatoes. Spinach. Kale. Artichokes. Cabbage. Broccoli. Cauliflower. Green peas. Carrots. Squash. Grains Whole-grain breads. Multigrain cereal. Oats and oatmeal. Brown rice. Barley. Bulgur wheat. [place]. Quinoa. Bran muffins. Popcorn. Rye wafer crackers. Meats and other proteins Navy beans, kidney beans, and pinto beans. Soybeans. Split peas. Lentils. Nuts and seeds. Dairy Fiber-fortified yogurt. Beverages Fiber-fortified soy milk. Fiber-fortified orange juice. Other foods Fiber bars. The items listed above may not be a complete list of recommended foods and beverages. Contact a dietitian for more information. What foods should I avoid? Fruits Fruit juice. Cooked, strained fruit. Vegetables Fried potatoes. Canned vegetables. Well-cooked vegetables. Grains White bread. Pasta made with refined flour. White rice. Meats and other proteins Fatty cuts of meat. Fried chicken or fried fish. Dairy Milk. Yogurt. Cream cheese. Sour cream. Fats and oils Butters. Beverages Soft drinks. Other foods Cakes and pastries. The items listed above may not be a complete list of foods and beverages to avoid. Talk with your dietitian about what choices are best for you. Summary  Fiber is a type of  carbohydrate. It is found in foods such as fruits, vegetables, whole grains, and beans.  A high-fiber diet has many benefits. It can help to prevent constipation, lower blood cholesterol, aid weight loss, and reduce your risk of heart disease, diabetes, and certain cancers.  Increase your intake of fiber gradually. Increasing fiber too quickly may cause cramping, bloating, and gas. Drink plenty of water while you increase the amount of fiber you consume.  The best sources of fiber include whole fruits and vegetables, whole grains, nuts, seeds, and beans. This information is not intended to replace advice given to you by your health care provider. Make sure you discuss any questions you have with your health care provider. Document Revised: 05/12/2019 Document Reviewed: 05/12/2019 Elsevier Patient Education  2021 [employer].

## 2020-07-06 ENCOUNTER — Encounter: Payer: Self-pay | Admitting: Urology

## 2020-07-06 ENCOUNTER — Other Ambulatory Visit: Payer: Self-pay

## 2020-07-06 ENCOUNTER — Ambulatory Visit (INDEPENDENT_AMBULATORY_CARE_PROVIDER_SITE_OTHER): Payer: Medicare Other | Admitting: Urology

## 2020-07-06 VITALS — BP 97/60 | HR 60 | Temp 97.8°F | Resp 16 | Ht 68.0 in | Wt 212.0 lb

## 2020-07-06 DIAGNOSIS — R351 Nocturia: Secondary | ICD-10-CM

## 2020-07-06 DIAGNOSIS — N401 Enlarged prostate with lower urinary tract symptoms: Secondary | ICD-10-CM | POA: Diagnosis not present

## 2020-07-06 DIAGNOSIS — C61 Malignant neoplasm of prostate: Secondary | ICD-10-CM | POA: Diagnosis not present

## 2020-07-06 DIAGNOSIS — N138 Other obstructive and reflux uropathy: Secondary | ICD-10-CM | POA: Diagnosis not present

## 2020-07-06 MED ORDER — ALFUZOSIN HCL ER 10 MG PO TB24
10.0000 mg | ORAL_TABLET | Freq: Every day | ORAL | 3 refills | Status: DC
Start: 2020-07-06 — End: 2021-01-09

## 2020-07-06 NOTE — Progress Notes (Signed)
07/06/2020 1:49 PM   Bryan Hamilton 12-Jan-1946 [id number]  Referring provider: Celene Squibb, MD [address] [place],  [place] [address]  Chief Complaint  Patient presents with   Follow-up   Followup BPH and prostate cancer  HPI: Bryan Hamilton is a 75yo here for followup for BPH and prostate cancer. No recent PSA. He underwent brachytherapy in 09/2019. IPSS 6 QOL 2. He is not bothered by his urinary frequency and nocturia. He has nocturia 2-3x. His stream is strong on uroxatral 10mg  qhs. No other complaints today.    PMH: Past Medical History:  Diagnosis Date   Anxiety    Arrhythmia    Arthritis    Asthma    Atrial fibrillation ([facility])    COPD (chronic obstructive pulmonary disease) ([place])    Depression    Diabetes mellitus without complication ([facility])    tpe 2    Dysrhythmia    afib    GERD (gastroesophageal reflux disease)    Hyperchloremia    Hypertension    PONV (postoperative nausea and vomiting)    Prostate cancer ([place])     Surgical History: Past Surgical History:  Procedure Laterality Date   BACK SURGERY     CYSTOSCOPY N/A 10/14/2019   Procedure: CYSTOSCOPY FLEXIBLE;  Surgeon: Cleon Gustin, MD;  Location: [facility];  Service: Urology;  Laterality: N/A;  NO SEEDS FOUND IN BLADDER   PROSTATE BIOPSY N/A 07/20/2019   Procedure: PROSTATE BIOPSY;  Surgeon: Cleon Gustin, MD;  Location: [facility];  Service: Urology;  Laterality: N/A;   RADIOACTIVE SEED IMPLANT N/A 10/14/2019   Procedure: RADIOACTIVE SEED IMPLANT/BRACHYTHERAPY IMPLANT;  Surgeon: Cleon Gustin, MD;  Location: [facility];  Service: Urology;  Laterality: N/A;    69  SEEDS IMPLANTED   SPACE OAR INSTILLATION N/A 10/14/2019   Procedure: SPACE OAR INSTILLATION;  Surgeon: Cleon Gustin, MD;  Location: [facility];  Service: Urology;  Laterality: N/A;    Home Medications:  Allergies as of 07/06/2020   No Known Allergies      Medication  List        Accurate as of July 06, 2020  1:49 PM. If you have any questions, ask your nurse or doctor.          acetaminophen 650 MG CR tablet Commonly known as: TYLENOL Take 1,300 mg by mouth 2 (two) times daily as needed for pain.   alfuzosin 10 MG 24 hr tablet Commonly known as: UROXATRAL Take 1 tablet (10 mg total) by mouth daily with breakfast.   ALPRAZolam 0.25 MG tablet Commonly known as: XANAX Take 0.25 mg by mouth daily as needed for anxiety.   apixaban 5 MG Tabs tablet Commonly known as: ELIQUIS Take 1 tablet (5 mg total) by mouth 2 (two) times daily.   atorvastatin 20 MG tablet Commonly known as: LIPITOR Take 20 mg by mouth at bedtime.   diclofenac Sodium 1 % Gel Commonly known as: VOLTAREN Apply 2 g topically daily as needed (Pain).   diltiazem 300 MG 24 hr capsule Commonly known as: Cardizem CD Take 1 capsule (300 mg total) by mouth daily. Please make yearly appt with Dr. Irish Lack for August 2022 for future refills. Thank you 1st attempt   furosemide 40 MG tablet Commonly known as: LASIX Take 1 tablet (40 mg total) by mouth 2 (two) times daily as needed.   metFORMIN 500 MG 24 hr tablet Commonly known as: GLUCOPHAGE-XR Take 500  mg by mouth daily.   metoprolol tartrate 50 MG tablet Commonly known as: LOPRESSOR Take 50 mg by mouth 2 (two) times daily.   OMEGA 3 PO Take 4 capsules by mouth daily. Last dose on 09/29/19   ProAir HFA 108 (90 Base) MCG/ACT inhaler Generic drug: albuterol Inhale 2 puffs into the lungs every 6 (six) hours as needed for wheezing or shortness of breath.   sertraline 25 MG tablet Commonly known as: ZOLOFT Take 25 mg by mouth at bedtime.   Systane 0.4-0.3 % Soln Generic drug: Polyethyl Glycol-Propyl Glycol Place 1-2 drops into both eyes daily as needed (Dry eye).   traMADol 50 MG tablet Commonly known as: ULTRAM Take 1 tablet (50 mg total) by mouth every 6 (six) hours as needed for moderate pain.   Trelegy Ellipta  100-62.5-25 MCG/INH Aepb Generic drug: Fluticasone-Umeclidin-Vilant Inhale 1 puff into the lungs daily.        Allergies: No Known Allergies  Family History: Family History  Problem Relation Age of Onset   Cancer Father        type unknown   Breast cancer Neg Hx    Prostate cancer Neg Hx    Colon cancer Neg Hx    Pancreatic cancer Neg Hx     Social History:  reports that he quit smoking about 12 years ago. His smoking use included cigarettes. He has a 80.00 pack-year smoking history. He has never used smokeless tobacco. He reports current alcohol use. He reports that he does not use drugs.  ROS: All other review of systems were reviewed and are negative except what is noted above in HPI  Physical Exam: BP 97/60   Pulse 60   Temp 97.8 F (36.6 C) (Oral)   Resp 16   Ht 5\' 8"  (1.727 m)   Wt 212 lb (96.2 kg)   BMI 32.23 kg/m   Constitutional:  Alert and oriented, No acute distress. HEENT: [place] AT, moist mucus membranes.  Trachea midline, no masses. Cardiovascular: No clubbing, cyanosis, or edema. Respiratory: Normal respiratory effort, no increased work of breathing. GI: Abdomen is soft, nontender, nondistended, no abdominal masses GU: No CVA tenderness.  Lymph: No cervical or inguinal lymphadenopathy. Skin: No rashes, bruises or suspicious lesions. Neurologic: Grossly intact, no focal deficits, moving all 4 extremities. Psychiatric: Normal mood and affect.  Laboratory Data: Lab Results  Component Value Date   WBC 7.9 10/11/2019   HGB 13.2 10/11/2019   HCT 41.0 10/11/2019   MCV 93.2 10/11/2019   PLT 276 10/11/2019    Lab Results  Component Value Date   CREATININE 1.06 10/11/2019    No results found for: PSA  No results found for: TESTOSTERONE  No results found for: HGBA1C  Urinalysis No results found for: COLORURINE, APPEARANCEUR, LABSPEC, PHURINE, GLUCOSEU, HGBUR, BILIRUBINUR, KETONESUR, PROTEINUR, UROBILINOGEN, NITRITE, LEUKOCYTESUR  No results found  for: LABMICR, [place], RBCUA, LABEPIT, MUCUS, BACTERIA  Pertinent Imaging:  No results found for this or any previous visit.  No results found for this or any previous visit.  No results found for this or any previous visit.  No results found for this or any previous visit.  No results found for this or any previous visit.  No results found for this or any previous visit.  No results found for this or any previous visit.  No results found for this or any previous visit.   Assessment & Plan:    1. Benign prostatic hyperplasia with urinary obstruction -continue uroxatral 10mg  qhs  2. Prostate cancer ([place]) PSA today, RTC 6 months with PSA  3. Nocturia -continue uroxatral 10mg .    No follow-ups on file.  Nicolette Bang, MD  [facility] [place]

## 2020-07-06 NOTE — Addendum Note (Signed)
Addended by: Cleon Gustin on: 07/06/2020 01:59 PM   Modules accepted: Orders

## 2020-07-06 NOTE — Patient Instructions (Signed)
Prostate Cancer  The prostate is a male gland that helps make semen. It is located below a man's bladder, in front of the rectum. Prostate cancer is when abnormal cells grow inthis gland. What are the causes? The cause of this condition is not known. What increases the risk? You are more likely to develop this condition if: You are 77 years of age or older. You are African American. You have a family history of prostate cancer. You have a family history of breast cancer. What are the signs or symptoms? Symptoms of this condition include: A need to pee often. Peeing that is weak, or pee that stops and starts. Trouble starting or stopping your pee. Inability to pee. Blood in your pee or semen. Pain in the lower back, lower belly (abdomen), hips, or upper thighs. Trouble getting an erection. Trouble emptying all of your pee. How is this treated? Treatment for this condition depends on your age, your health, the kind of treatment you like, and how far the cancer has spread. Treatments include: Being watched. This is called observation. You will be tested from time to time, but you will not get treated. Tests are to make sure that the cancer is not growing. Surgery. This may be done to remove the prostate, to remove the testicles, or to freeze or kill cancer cells. Radiation. This uses a strong beam to kill cancer cells. Ultrasound energy. This uses strong sound waves to kill cancer cells. Chemotherapy. This uses medicines that stop cancer cells from increasing. This kills cancer cells and healthy cells. Targeted therapy. This kills cancer cells only. Healthy cells are not affected. Hormone treatment. This stops the body from making hormones that help the cancer cells to grow. Follow these instructions at home: Take over-the-counter and prescription medicines only as told by your doctor. Eat a healthy diet. Get plenty of sleep. Ask your doctor for help to find a support group for men  with prostate cancer. If you have to go to the hospital, let your cancer doctor (oncologist) know. Treatment may affect your ability to have sex. Touch, hold, hug, and caress your partner to have intimate moments. Keep all follow-up visits as told by your doctor. This is important. Contact a doctor if: You have new or more trouble peeing. You have new or more blood in your pee. You have new or more pain in your hips, back, or chest. Get help right away if: You have weakness in your legs. You lose feeling in your legs. You cannot control your pee or your poop (stool). You have chills or a fever. Summary The prostate is a male gland that helps make semen. Prostate cancer is when abnormal cells grow in this gland. Treatment includes doing surgery, using medicines, using very strong beams, or watching without treatment. Ask your doctor for help to find a support group for men with prostate cancer. Contact a doctor if you have problems peeing or have any new pain that you did not have before. This information is not intended to replace advice given to you by your health care provider. Make sure you discuss any questions you have with your healthcare provider. Document Revised: 02/09/2020 Document Reviewed: 12/21/2018 Elsevier Patient Education  2022 [employer].  

## 2020-07-06 NOTE — Progress Notes (Signed)
Urological Symptom Review  Patient is experiencing the following symptoms: Frequent urination Hard to postpone urination Get up at night to urinate   Review of Systems  Gastrointestinal (upper)  : Negative for upper GI symptoms  Gastrointestinal (lower) : Negative for lower GI symptoms  Constitutional : Negative for symptoms  Skin: Negative for skin symptoms  Eyes: Negative for eye symptoms  Ear/Nose/Throat : Sinus problems  Hematologic/Lymphatic: Negative for Hematologic/Lymphatic symptoms  Cardiovascular : Leg swelling  Respiratory : Shortness of breath  Endocrine: Negative for endocrine symptoms  Musculoskeletal: Back pain  Neurological: Negative for neurological symptoms  Psychologic: Negative for psychiatric symptoms

## 2020-07-07 LAB — PSA: Prostate Specific Ag, Serum: 0.5 ng/mL (ref 0.0–4.0)

## 2020-07-17 NOTE — Progress Notes (Signed)
Results mailed 

## 2020-07-19 DIAGNOSIS — I10 Essential (primary) hypertension: Secondary | ICD-10-CM | POA: Diagnosis not present

## 2020-08-01 ENCOUNTER — Other Ambulatory Visit: Payer: Self-pay

## 2020-08-01 MED ORDER — DILTIAZEM HCL ER COATED BEADS 300 MG PO CP24
300.0000 mg | ORAL_CAPSULE | Freq: Every day | ORAL | 3 refills | Status: DC
Start: 2020-08-01 — End: 2020-09-10

## 2020-08-19 DIAGNOSIS — I10 Essential (primary) hypertension: Secondary | ICD-10-CM | POA: Diagnosis not present

## 2020-08-23 ENCOUNTER — Emergency Department (HOSPITAL_COMMUNITY)
Admission: EM | Admit: 2020-08-23 | Discharge: 2020-08-23 | Disposition: A | Discharge status: Home / Self Care | Payer: Medicare Other | Attending: Emergency Medicine | Admitting: Emergency Medicine

## 2020-08-23 ENCOUNTER — Other Ambulatory Visit: Payer: Self-pay

## 2020-08-23 ENCOUNTER — Emergency Department (HOSPITAL_COMMUNITY): Payer: Medicare Other

## 2020-08-23 ENCOUNTER — Encounter (HOSPITAL_COMMUNITY): Payer: Self-pay | Admitting: Emergency Medicine

## 2020-08-23 DIAGNOSIS — Z7901 Long term (current) use of anticoagulants: Secondary | ICD-10-CM | POA: Diagnosis not present

## 2020-08-23 DIAGNOSIS — J45909 Unspecified asthma, uncomplicated: Secondary | ICD-10-CM | POA: Diagnosis not present

## 2020-08-23 DIAGNOSIS — R600 Localized edema: Secondary | ICD-10-CM | POA: Diagnosis not present

## 2020-08-23 DIAGNOSIS — J449 Chronic obstructive pulmonary disease, unspecified: Secondary | ICD-10-CM | POA: Insufficient documentation

## 2020-08-23 DIAGNOSIS — Z8546 Personal history of malignant neoplasm of prostate: Secondary | ICD-10-CM | POA: Insufficient documentation

## 2020-08-23 DIAGNOSIS — Z87891 Personal history of nicotine dependence: Secondary | ICD-10-CM | POA: Insufficient documentation

## 2020-08-23 DIAGNOSIS — E119 Type 2 diabetes mellitus without complications: Secondary | ICD-10-CM | POA: Diagnosis not present

## 2020-08-23 DIAGNOSIS — Z8679 Personal history of other diseases of the circulatory system: Secondary | ICD-10-CM

## 2020-08-23 DIAGNOSIS — Z7984 Long term (current) use of oral hypoglycemic drugs: Secondary | ICD-10-CM | POA: Insufficient documentation

## 2020-08-23 DIAGNOSIS — I10 Essential (primary) hypertension: Secondary | ICD-10-CM | POA: Insufficient documentation

## 2020-08-23 DIAGNOSIS — M7989 Other specified soft tissue disorders: Secondary | ICD-10-CM

## 2020-08-23 DIAGNOSIS — L03115 Cellulitis of right lower limb: Secondary | ICD-10-CM | POA: Diagnosis not present

## 2020-08-23 DIAGNOSIS — I4891 Unspecified atrial fibrillation: Secondary | ICD-10-CM | POA: Diagnosis not present

## 2020-08-23 DIAGNOSIS — R609 Edema, unspecified: Secondary | ICD-10-CM

## 2020-08-23 DIAGNOSIS — Z8709 Personal history of other diseases of the respiratory system: Secondary | ICD-10-CM

## 2020-08-23 DIAGNOSIS — I517 Cardiomegaly: Secondary | ICD-10-CM | POA: Diagnosis not present

## 2020-08-23 LAB — COMPREHENSIVE METABOLIC PANEL
ALT: 15 U/L (ref 0–44)
AST: 15 U/L (ref 15–41)
Albumin: 3.9 g/dL (ref 3.5–5.0)
Alkaline Phosphatase: 105 U/L (ref 38–126)
Anion gap: 8 (ref 5–15)
BUN: 34 mg/dL — ABNORMAL HIGH (ref 8–23)
CO2: 28 mmol/L (ref 22–32)
Calcium: 8.7 mg/dL — ABNORMAL LOW (ref 8.9–10.3)
Chloride: 99 mmol/L (ref 98–111)
Creatinine, Ser: 1.45 mg/dL — ABNORMAL HIGH (ref 0.61–1.24)
GFR, Estimated: 50 mL/min — ABNORMAL LOW (ref >60)
Glucose, Bld: 127 mg/dL — ABNORMAL HIGH (ref 70–99)
Potassium: 4.3 mmol/L (ref 3.5–5.1)
Sodium: 135 mmol/L (ref 135–145)
Total Bilirubin: 0.6 mg/dL (ref 0.3–1.2)
Total Protein: 7.4 g/dL (ref 6.5–8.1)

## 2020-08-23 LAB — BRAIN NATRIURETIC PEPTIDE: B Natriuretic Peptide: 94 pg/mL (ref 0.0–100.0)

## 2020-08-23 LAB — CBC
HCT: 43.9 % (ref 39.0–52.0)
Hemoglobin: 13.7 g/dL (ref 13.0–17.0)
MCH: 30.3 pg (ref 26.0–34.0)
MCHC: 31.2 g/dL (ref 30.0–36.0)
MCV: 97.1 fL (ref 80.0–100.0)
Platelets: 197 10*3/uL (ref 150–400)
RBC: 4.52 MIL/uL (ref 4.22–5.81)
RDW: 16.7 % — ABNORMAL HIGH (ref 11.5–15.5)
WBC: 8.9 10*3/uL (ref 4.0–10.5)
nRBC: 0 % (ref 0.0–0.2)

## 2020-08-23 MED ORDER — CEFAZOLIN SODIUM-DEXTROSE 2-4 GM/100ML-% IV SOLN
2.0000 g | Freq: Three times a day (TID) | INTRAVENOUS | Status: DC
  Administered 2020-08-23: 2 g via INTRAVENOUS
  Filled 2020-08-23: qty 100

## 2020-08-23 MED ORDER — ALBUTEROL SULFATE HFA 108 (90 BASE) MCG/ACT IN AERS
4.0000 | INHALATION_SPRAY | Freq: Once | RESPIRATORY_TRACT | Status: AC
  Administered 2020-08-23: 4 via RESPIRATORY_TRACT
  Filled 2020-08-23: qty 6.7

## 2020-08-23 MED ORDER — CEPHALEXIN 500 MG PO CAPS: 500.0000 mg | ORAL_CAPSULE | Freq: Four times a day (QID) | ORAL | 0 refills | Status: DC

## 2020-08-23 NOTE — Discharge Instructions (Addendum)
It was our pleasure to provide your ER care today - we hope that you feel better.  Elevate legs. Limit salt intake. Take one extra lasix tablet a day for the next two days. Take keflex (antibiotic) as prescribed.   Follow up with your cardiologist in the coming week - call office today or tomorrow AM to arrange appointment.   For copd and borderline pulse ox/oxygen levels - follow up with pulmonary doctor in the next 1-2 weeks - call office to arrange appointment.  Return to ER if symptoms worsen, fevers, spreading redness/increased swelling, severe leg pain, chest pain, increased trouble breathing, or other concern.

## 2020-08-23 NOTE — ED Triage Notes (Addendum)
Pt to the ED with bilateral leg swelling for the past few days- worse on the right.

## 2020-08-23 NOTE — ED Provider Notes (Signed)
[facility] Provider Note   CSN: [id number] Arrival date & time: 08/23/20  0954     History Chief Complaint  Patient presents with   Leg Swelling    DOULGAS BUDNICK is a 75 y.o. male.  Patient w hx copd,  afib on eliquis, c/o bilateral leg swelling R>L. States legs stay swollen, but is new to have right leg more swollen than left. Symptoms acute onset in past week, constant, moderate. Mild erythema to anterior right lower leg. No pain to leg. No numbness/weakness. No chest pain. States feels breathing at baseline, states always some sob and doe due to copd. Former smoker. Denies chest pain or discomfort or any pleuritic pain. No new or increased wob. ?orthopnea. No pnd. Use albuterol tx prn. No hx dvt or pe, and indicates is compliant w eliquis therapy.   The history is provided by the patient.      Past Medical History:  Diagnosis Date   Anxiety    Arrhythmia    Arthritis    Asthma    Atrial fibrillation (HCC)    COPD (chronic obstructive pulmonary disease) ([place])    Depression    Diabetes mellitus without complication (HCC)    tpe 2    Dysrhythmia    afib    GERD (gastroesophageal reflux disease)    Hyperchloremia    Hypertension    PONV (postoperative nausea and vomiting)    Prostate cancer [facility])     Patient Active Problem List   Diagnosis Date Noted   Nocturia 11/02/2019   Prostate cancer ([place]) 07/27/2019   Elevated PSA 07/13/2019   Right hip pain    New onset atrial fibrillation ([place]) 05/12/2019    Past Surgical History:  Procedure Laterality Date   BACK SURGERY     CYSTOSCOPY N/A 10/14/2019   Procedure: CYSTOSCOPY FLEXIBLE;  Surgeon: Cleon Gustin, MD;  Location: [facility];  Service: Urology;  Laterality: N/A;  NO SEEDS FOUND IN BLADDER   PROSTATE BIOPSY N/A 07/20/2019   Procedure: PROSTATE BIOPSY;  Surgeon: Cleon Gustin, MD;  Location: [facility];  Service: Urology;  Laterality: N/A;   RADIOACTIVE SEED  IMPLANT N/A 10/14/2019   Procedure: RADIOACTIVE SEED IMPLANT/BRACHYTHERAPY IMPLANT;  Surgeon: Cleon Gustin, MD;  Location: [facility];  Service: Urology;  Laterality: N/A;    67  SEEDS IMPLANTED   SPACE OAR INSTILLATION N/A 10/14/2019   Procedure: SPACE OAR INSTILLATION;  Surgeon: Cleon Gustin, MD;  Location: [facility];  Service: Urology;  Laterality: N/A;       Family History  Problem Relation Age of Onset   Cancer Father        type unknown   Breast cancer Neg Hx    Prostate cancer Neg Hx    Colon cancer Neg Hx    Pancreatic cancer Neg Hx     Social History   Tobacco Use   Smoking status: Former    Packs/day: 2.00    Years: 40.00    Pack years: 80.00    Types: Cigarettes    Quit date: 01/21/2008    Years since quitting: 12.5   Smokeless tobacco: Never  Vaping Use   Vaping Use: Never used  Substance Use Topics   Alcohol use: Yes    Comment: rarely    Drug use: Never    Home Medications Prior to Admission medications   Medication Sig Start Date End Date Taking? Authorizing Provider  acetaminophen (TYLENOL) 650  MG CR tablet Take 1,300 mg by mouth 2 (two) times daily as needed for pain.    [provider]  alfuzosin (UROXATRAL) 10 MG 24 hr tablet Take 1 tablet (10 mg total) by mouth daily with breakfast. 07/06/20   McKenzie, Candee Furbish, MD  ALPRAZolam Duanne Moron) 0.25 MG tablet Take 0.25 mg by mouth daily as needed for anxiety.  07/12/19   [provider]  apixaban (ELIQUIS) 5 MG TABS tablet Take 1 tablet (5 mg total) by mouth 2 (two) times daily. 07/18/19   Herminio Commons, MD  atorvastatin (LIPITOR) 20 MG tablet Take 20 mg by mouth at bedtime.  03/07/19   [provider]  diclofenac Sodium (VOLTAREN) 1 % GEL Apply 2 g topically daily as needed (Pain).    [provider]  diltiazem (CARDIZEM CD) 300 MG 24 hr capsule Take 1 capsule (300 mg total) by mouth daily. 08/01/20   Jettie Booze, MD   furosemide (LASIX) 40 MG tablet Take 1 tablet (40 mg total) by mouth 2 (two) times daily as needed. 06/15/20   Jettie Booze, MD  metFORMIN (GLUCOPHAGE-XR) 500 MG 24 hr tablet Take 500 mg by mouth daily. 03/14/20   [provider]  metoprolol tartrate (LOPRESSOR) 50 MG tablet Take 50 mg by mouth 2 (two) times daily. 05/16/19   [provider]  Omega-3 Fatty Acids (OMEGA 3 PO) Take 4 capsules by mouth daily. Last dose on 09/29/19    [provider]  Polyethyl Glycol-Propyl Glycol (SYSTANE) 0.4-0.3 % SOLN Place 1-2 drops into both eyes daily as needed (Dry eye).     [provider]  PROAIR HFA 108 [id number] Base) MCG/ACT inhaler Inhale 2 puffs into the lungs every 6 (six) hours as needed for wheezing or shortness of breath.  03/31/19   [provider]  sertraline (ZOLOFT) 25 MG tablet Take 25 mg by mouth at bedtime.  07/12/19   [provider]  traMADol (ULTRAM) 50 MG tablet Take 1 tablet (50 mg total) by mouth every 6 (six) hours as needed for moderate pain. 05/14/19   Orson Eva, MD  TRELEGY ELLIPTA 100-62.5-25 MCG/INH AEPB Inhale 1 puff into the lungs daily. 03/31/19   [provider]    Allergies    Patient has no known allergies.  Review of Systems   Review of Systems  Constitutional:  Negative for chills and fever.  HENT:  Negative for sore throat.   Eyes:  Negative for redness.  Respiratory:  Negative for cough.   Cardiovascular:  Positive for leg swelling. Negative for chest pain and palpitations.  Gastrointestinal:  Negative for abdominal pain, nausea and vomiting.  Genitourinary:  Negative for flank pain.  Musculoskeletal:  Negative for back pain and neck pain.  Skin:  Negative for rash.  Neurological:  Negative for syncope, light-headedness and headaches.  Hematological:  Does not bruise/bleed easily.  Psychiatric/Behavioral:  Negative for confusion.    Physical Exam Updated Vital Signs BP 97/62   Pulse (!) 55   Temp  97.8 F (36.6 C) (Oral)   Resp 18   Ht 1.727 m ('5\' 8"'$ )   Wt 99.8 kg   SpO2 (!) 88%   BMI 33.45 kg/m   Physical Exam Vitals and nursing note reviewed.  Constitutional:      Appearance: Normal appearance. He is well-developed.  HENT:     Head: Atraumatic.     Nose: Nose normal.     Mouth/Throat:     Mouth: Mucous membranes  are moist.     Pharynx: Oropharynx is clear.  Eyes:     General: No scleral icterus.    Conjunctiva/sclera: Conjunctivae normal.  Neck:     Trachea: No tracheal deviation.  Cardiovascular:     Rate and Rhythm: Normal rate and regular rhythm.     Pulses: Normal pulses.     Heart sounds: Normal heart sounds. No murmur heard.   No friction rub. No gallop.  Pulmonary:     Effort: Pulmonary effort is normal. No accessory muscle usage or respiratory distress.     Breath sounds: Wheezing present.  Abdominal:     General: Bowel sounds are normal. There is no distension.     Palpations: Abdomen is soft.     Tenderness: There is no abdominal tenderness.  Genitourinary:    Comments: No cva tenderness. Musculoskeletal:     Cervical back: Normal range of motion and neck supple. No rigidity.     Right lower leg: Edema present.     Left lower leg: Edema present.     Comments: RLE>LLE swelling. Mild erythema and increased warmth to right lower leg. Compartments of bilateral legs, soft, not tense. Distal pulses palp bilateral. No foot ulcers.   Skin:    General: Skin is warm and dry.     Findings: No rash.  Neurological:     Mental Status: He is alert.     Comments: Alert, speech clear.   Psychiatric:        Mood and Affect: Mood normal.    ED Results / Procedures / Treatments   Labs (all labs ordered are listed, but only abnormal results are displayed) Results for orders placed or performed during the hospital encounter of 08/23/20  CBC  Result Value Ref Range   WBC 8.9 4.0 - 10.5 K/uL   RBC 4.52 4.22 - 5.81 MIL/uL   Hemoglobin 13.7 13.0 - 17.0 g/dL    HCT 43.9 39.0 - 52.0 %   MCV 97.1 80.0 - 100.0 fL   MCH 30.3 26.0 - 34.0 pg   MCHC 31.2 30.0 - 36.0 g/dL   RDW 16.7 (H) 11.5 - 15.5 %   Platelets 197 150 - 400 K/uL   nRBC 0.0 0.0 - 0.2 %  Comprehensive metabolic panel  Result Value Ref Range   Sodium 135 135 - 145 mmol/L   Potassium 4.3 3.5 - 5.1 mmol/L   Chloride 99 98 - 111 mmol/L   CO2 28 22 - 32 mmol/L   Glucose, Bld 127 (H) 70 - 99 mg/dL   BUN 34 (H) 8 - 23 mg/dL   Creatinine, Ser 1.45 (H) 0.61 - 1.24 mg/dL   Calcium 8.7 (L) 8.9 - 10.3 mg/dL   Total Protein 7.4 6.5 - 8.1 g/dL   Albumin 3.9 3.5 - 5.0 g/dL   AST 15 15 - 41 U/L   ALT 15 0 - 44 U/L   Alkaline Phosphatase 105 38 - 126 U/L   Total Bilirubin 0.6 0.3 - 1.2 mg/dL   GFR, Estimated 50 (L) >60 mL/min   Anion gap 8 5 - 15  Brain natriuretic peptide  Result Value Ref Range   B Natriuretic Peptide 94.0 0.0 - 100.0 pg/mL      EKG None  Radiology US Venous Img Lower Right (DVT Study)  Result Date: 08/23/2020 CLINICAL DATA:  Right lower extremity swelling EXAM: RIGHT LOWER EXTREMITY VENOUS DOPPLER ULTRASOUND TECHNIQUE: Gray-scale sonography with graded compression, as well as color Doppler and duplex ultrasound were performed  to evaluate the lower extremity deep venous systems from the level of the common femoral vein and including the common femoral, femoral, profunda femoral, popliteal and calf veins including the posterior tibial, peroneal and gastrocnemius veins when visible. The superficial great saphenous vein was also interrogated. Spectral Doppler was utilized to evaluate flow at rest and with distal augmentation maneuvers in the common femoral, femoral and popliteal veins. COMPARISON:  None. FINDINGS: Contralateral Common Femoral Vein: Respiratory phasicity is normal and symmetric with the symptomatic side. No evidence of thrombus. Normal compressibility. Common Femoral Vein: No evidence of thrombus. Normal compressibility, respiratory phasicity and response to  augmentation. Saphenofemoral Junction: No evidence of thrombus. Normal compressibility and flow on color Doppler imaging. Profunda Femoral Vein: No evidence of thrombus. Normal compressibility and flow on color Doppler imaging. Femoral Vein: No evidence of thrombus. Normal compressibility, respiratory phasicity and response to augmentation. Popliteal Vein: No evidence of thrombus. Normal compressibility, respiratory phasicity and response to augmentation. Calf Veins: No evidence of thrombus. Normal compressibility and flow on color Doppler imaging. Superficial Great Saphenous Vein: No evidence of thrombus. Normal compressibility. Venous Reflux:  None. Other Findings:  Subcutaneous edema noted within the calf. IMPRESSION: No evidence of right lower extremity deep venous thrombosis. Electronically Signed   By: Davina Poke D.O.   On: 08/23/2020 11:22   DG Chest Port 1 View  Result Date: 08/23/2020 CLINICAL DATA:  Bilateral leg swelling. EXAM: PORTABLE CHEST 1 VIEW COMPARISON:  Chest x-ray 09/15/2019. FINDINGS: Cardiomegaly with mild bilateral interstitial prominence. Mild interstitial edema cannot be excluded. No pleural effusion or pneumothorax. Degenerative changes both shoulders. No acute bony abnormality. IMPRESSION: Cardiomegaly with mild bilateral interstitial prominence. Mild interstitial edema cannot be excluded. Electronically Signed   By: Marcello Moores  Register   On: 08/23/2020 11:17    Procedures Procedures   Medications Ordered in ED Medications  albuterol (VENTOLIN HFA) 108 (90 Base) MCG/ACT inhaler 4 puff (has no administration in time range)    ED Course  I have reviewed the triage vital signs and the nursing notes.  Pertinent labs & imaging results that were available during my care of the patient were reviewed by me and considered in my medical decision making (see chart for details).    MDM Rules/Calculators/A&P                          Labs and imaging ordered.  Reviewed  nursing notes and prior charts for additional history.   Labs reviewed/interpreted by me - chem normal.   CXR reviewed/interpreted by me - no pna. ?mild vascular congestion. Pt denies pnd, no chest pain or discomfort, and indicates feels breathing at baseline. Albuterol tx given for mild wheezing. When pulse ox is picking up/correlating, o2 sats 92-93%. Pt continues to indicates feels breathing is at baseline. Additional labs reviewed/interpreted by me - bnp normal.   Vascular u/s reviewed/interpreted by me - no dvt.   Exam c/w mild/early cellulitis. Abx tx given.   Discussed labs and xrays w pt.   Rec close cardiology f/u re htn, afib, ?chf. Also rec pulmonary f/u re copd, borderline pulse ox, etc.  Return precautions provided.       Final Clinical Impression(s) / ED Diagnoses Final diagnoses:  None    Rx / DC Orders ED Discharge Orders     None        Lajean Saver, MD 08/23/20 1228

## 2020-08-29 NOTE — Progress Notes (Signed)
Synopsis: Referred for COPD and 'borderline oxygen saturation' by Celene Squibb, MD  Subjective:   PATIENT ID: Bryan Hamilton GENDER: male DOB: 06-13-1945, MRN: [id number]  Chief Complaint  Patient presents with   Consult    Consult. Recent hospital, DX COPD. Trelegy daily and using Pro-air 2-3x/day. Reports increased wheezing.       23yM with history of COPD on trelegy, asthma, AF on eliquis, GERD who is referred for COPD and 'borderline oxygen saturation.'  He has had increasing DOE. Can only walk about 50 yards due to [place]. Over the last 3 months is when he's had this decline - at that point he thinks he could have walked about 100 yards. He doesn't have much of a cough, does feel chest congestion but can't quite bring it up. Currently using albuterol nebulizer 1-2 times daily, albuterol mdi 2-3x daily but doesn't notice much improvement with them. Uses trelegy daily for the last 4 years and does believe it was helpful.  No hospitalizations this year due to COPD or ever. He has received no prescriptions for steroids this year for COPD. Last year got a course of steroids but didn't think it was very helpful wrt dyspnea.   Had ED trip last week due to RLE swelling. Thought there to have cellulitis.  Never has had sleep study.  Otherwise pertinent review of systems is negative.  Previous inhalers:  Unsure what he's tried other than trelegy and albuterol   Dad had COPD. Mom had cancer, unsure if lung cancer.   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. Has dog at home. Never has lived outside of [place].    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;  Service: Urology;  Laterality: N/A;  NO SEEDS FOUND IN BLADDER   PROSTATE BIOPSY N/A 07/20/2019   Procedure: PROSTATE BIOPSY;  Surgeon: Cleon Gustin, MD;  Location: [facility];  Service: Urology;  Laterality: N/A;   RADIOACTIVE SEED IMPLANT N/A 10/14/2019   Procedure: RADIOACTIVE SEED IMPLANT/BRACHYTHERAPY IMPLANT;  Surgeon: Cleon Gustin, MD;  Location: [facility];  Service: Urology;  Laterality: N/A;    69  SEEDS IMPLANTED   SPACE OAR INSTILLATION N/A 10/14/2019   Procedure: SPACE OAR INSTILLATION;  Surgeon: Cleon Gustin, MD;  Location: [facility];  Service: Urology;  Laterality: N/A;    Social History   Socioeconomic History   Marital status: Married  Spouse name: Not on file   Number of children: 2   Years of education: 64   Highest education level: Not on file  Occupational History   Not on file  Tobacco Use   Smoking status: Former    Packs/day: 2.00    Years: 40.00    Pack years: 80.00    Types: Cigarettes    Quit date: 01/21/2008    Years since quitting: 12.6   Smokeless tobacco: Never  Vaping Use   Vaping Use: Never used  Substance and Sexual Activity   Alcohol use: Yes    Comment: rarely    Drug use: Never   Sexual activity: Not Currently  Other Topics Concern   Not on file  Social History Narrative   Not on file   Social Determinants of Health   Financial Resource Strain: Not on file  Food Insecurity: Not on file  Transportation  Needs: Not on file  Physical Activity: Not on file  Stress: Not on file  Social Connections: Not on file  Intimate Partner Violence: Not on file     No Known Allergies   Outpatient Medications Prior to Visit  Medication Sig Dispense Refill   acetaminophen (TYLENOL) 650 MG CR tablet Take 1,300 mg by mouth 2 (two) times daily as needed for pain.     alfuzosin (UROXATRAL) 10 MG 24 hr tablet Take 1 tablet (10 mg total) by mouth daily with breakfast. 90 tablet 3   ALPRAZolam (XANAX) 0.25 MG tablet Take 0.25 mg by mouth daily as needed for anxiety.      apixaban (ELIQUIS) 5 MG TABS tablet Take 1 tablet (5 mg total) by mouth 2 (two) times daily. 60 tablet 11   atorvastatin (LIPITOR) 20 MG tablet Take 20 mg by mouth at bedtime.      diclofenac Sodium (VOLTAREN) 1 % GEL Apply 2 g topically daily as needed (Pain).     diltiazem (CARDIZEM CD) 300 MG 24 hr capsule Take 1 capsule (300 mg total) by mouth daily. 90 capsule 3   furosemide (LASIX) 40 MG tablet Take 1 tablet (40 mg total) by mouth 2 (two) times daily as needed. 180 tablet 1   metFORMIN (GLUCOPHAGE-XR) 500 MG 24 hr tablet Take 500 mg by mouth daily.     metoprolol tartrate (LOPRESSOR) 50 MG tablet Take 50 mg by mouth 2 (two) times daily.     Omega-3 Fatty Acids (OMEGA 3 PO) Take 4 capsules by mouth daily. Last dose on 09/29/19     Polyethyl Glycol-Propyl Glycol (SYSTANE) 0.4-0.3 % SOLN Place 1-2 drops into both eyes daily as needed (Dry eye).      PROAIR HFA 108 (90 Base) MCG/ACT inhaler Inhale 2 puffs into the lungs every 6 (six) hours as needed for wheezing or shortness of breath.      sertraline (ZOLOFT) 25 MG tablet Take 25 mg by mouth at bedtime.      traMADol (ULTRAM) 50 MG tablet Take 1 tablet (50 mg total) by mouth every 6 (six) hours as needed for moderate pain. 12 tablet 0   TRELEGY ELLIPTA 100-62.5-25 MCG/INH AEPB Inhale 1 puff into the lungs daily.     cephALEXin (KEFLEX) 500 MG capsule Take 1 capsule (500 mg total) by mouth 4  (four) times daily. (Patient not taking: Reported on 08/30/2020) 28 capsule 0   No facility-administered medications prior to visit.       Objective:   Physical Exam:  General appearance: 75 y.o., male, NAD, conversant  Eyes: anicteric sclerae, moist conjunctivae; no lid-lag; PERRL, tracking appropriately HENT: NCAT; oropharynx, MMM, no mucosal ulcerations; normal hard and soft palate Neck: Trachea midline; no lymphadenopathy, no JVD Lungs: CTAB, no crackles, no wheeze, with normal respiratory effort CV: RRR, no MRGs  Abdomen: Soft, non-tender; non-distended, BS present  Extremities: 1+ BLE R>L edema, radial and DP pulses present bilaterally  Skin: Normal temperature, turgor and texture; venous stasis changes over BLE R>L Psych: Appropriate affect Neuro: Alert and oriented to person and place, no focal deficit    Vitals:   08/30/20 1116  BP: 100/62  Pulse: (!) 56  Temp: 97.6 F (36.4 C)  TempSrc: Oral  SpO2: (!) 87%  Weight: 214 lb 12.8 oz (97.4 kg)  Height: '5\' 8"'$  (1.727 m)   (!) 87% on RA BMI Readings from Last 3 Encounters:  08/30/20 32.66 kg/m  08/23/20 33.45 kg/m  07/06/20 32.23 kg/m   Wt Readings from Last 3 Encounters:  08/30/20 214 lb 12.8 oz (97.4 kg)  08/23/20 220 lb (99.8 kg)  07/06/20 212 lb (96.2 kg)     CBC    Component Value Date/Time   WBC 8.9 08/23/2020 1105   RBC 4.52 08/23/2020 1105   HGB 13.7 08/23/2020 1105   HCT 43.9 08/23/2020 1105   PLT 197 08/23/2020 1105   MCV 97.1 08/23/2020 1105   MCH 30.3 08/23/2020 1105   MCHC 31.2 08/23/2020 1105   RDW 16.7 (H) 08/23/2020 1105   LYMPHSABS 1.1 07/21/2019 1345   MONOABS 1.3 (H) 07/21/2019 1345   EOSABS 0.2 07/21/2019 1345   BASOSABS 0.1 07/21/2019 1345    06/15/20 QTc 399  Chest Imaging: CXR 08/23/20 reviewed by me and remarkable for prominent vascular markings   CT A/p 08/23/20 with RML, lingula scarring, posterior/dependent atelectasis  Pulmonary Functions Testing Results: No  flowsheet data found.    Echocardiogram:   TTE 05/13/19:  1. Left ventricular ejection fraction, by estimation, is 60 to 65%. The  left ventricle has normal function. Left ventricular endocardial border  not optimally defined to evaluate regional wall motion. There is mild left  ventricular hypertrophy. Left  ventricular diastolic parameters are indeterminate.   2. Right ventricular systolic function is normal. The right ventricular  size is mildly enlarged. There is moderately elevated pulmonary artery  systolic pressure.   3. Left atrial size was mildly dilated.   4. The mitral valve is normal in structure. No evidence of mitral valve  regurgitation. No evidence of mitral stenosis.   5. The aortic valve is tricuspid. Aortic valve regurgitation is not  visualized. No aortic stenosis is present.   6. The inferior vena cava is normal in size with greater than 50%  respiratory variability, suggesting right atrial pressure of 3 mmHg.    Heart Catheterization: None    Assessment & Plan:   # Dyspnea on exertion: likely multifactorial, may have component of COPD but also diastolic dysfunction (looks mildly hypervolemic today), deconditioning.   # COPD: presumptive diagnosis. No emphysema on prior CT A/P although there is some mucus plugging/atelectasis/scar. He's not an exacerbator by history.   # Chronic hypoxemic respiratory failure:  # Suspected sleep-disordered breathing:   Plan: - PFTs - will prescribe 4L O2 per [place] continuous at rest, during exertion, and sleep based on walking oximetry today in the office - abg to help guide selection of optimal sleep study  - continue trelegy - albuterol neb or inhaler prn - add flutter valve 10 slow but firm puffs twice daily after  albuterol treatment - although he's on the older end of the spectrum will place lung cancer screening program referral so he can discuss risks/benefits of starting screening - If confirm COPD or alternative  ventilatory defect then will refer to pulmonary rehab     Maryjane Hurter, MD [facility] 08/30/2020 11:57 AM

## 2020-08-30 ENCOUNTER — Other Ambulatory Visit: Payer: Self-pay

## 2020-08-30 ENCOUNTER — Ambulatory Visit: Payer: Medicare Other | Admitting: Student

## 2020-08-30 ENCOUNTER — Encounter: Payer: Self-pay | Admitting: Student

## 2020-08-30 VITALS — BP 100/62 | HR 56 | Temp 97.6°F | Ht 68.0 in | Wt 214.8 lb

## 2020-08-30 DIAGNOSIS — R0600 Dyspnea, unspecified: Secondary | ICD-10-CM

## 2020-08-30 DIAGNOSIS — J9611 Chronic respiratory failure with hypoxia: Secondary | ICD-10-CM | POA: Diagnosis not present

## 2020-08-30 DIAGNOSIS — J441 Chronic obstructive pulmonary disease with (acute) exacerbation: Secondary | ICD-10-CM

## 2020-08-30 DIAGNOSIS — Z87891 Personal history of nicotine dependence: Secondary | ICD-10-CM | POA: Diagnosis not present

## 2020-08-30 DIAGNOSIS — R0609 Other forms of dyspnea: Secondary | ICD-10-CM

## 2020-08-30 NOTE — Patient Instructions (Addendum)
-   We will schedule breathing tests this month - We will check an arterial blood gas to help guide optimal sleep study for you  - START using flutter valve 10 slow but firm puffs twice daily after using your albuterol - keep using trelegy every day as you are - referral placed for lung cancer screening program today   - oxygen 4L at rest, exertion, sleep - see you in 6 months or sooner if need be, just call us or send a message

## 2020-08-31 ENCOUNTER — Telehealth: Payer: Self-pay | Admitting: Student

## 2020-08-31 DIAGNOSIS — R0609 Other forms of dyspnea: Secondary | ICD-10-CM

## 2020-08-31 DIAGNOSIS — J441 Chronic obstructive pulmonary disease with (acute) exacerbation: Secondary | ICD-10-CM | POA: Diagnosis not present

## 2020-08-31 DIAGNOSIS — J9611 Chronic respiratory failure with hypoxia: Secondary | ICD-10-CM

## 2020-08-31 DIAGNOSIS — R0600 Dyspnea, unspecified: Secondary | ICD-10-CM

## 2020-08-31 NOTE — Telephone Encounter (Signed)
Urgent order has been placed with DME being changed to [facility]. I have sent Dr. Verlee Monte a message letting him know this so that way the order can be signed off ASAP and have also made PCCs aware so that way they can contact [place] to update them too with it being 5pm. [facility] is aware and will be delivering pt the O2. Nothing further needed.

## 2020-09-03 ENCOUNTER — Telehealth: Payer: Self-pay | Admitting: Student

## 2020-09-03 NOTE — Telephone Encounter (Signed)
Called and spoke with pt's daughter Verdis Frederickson about the call received from [place].  Verdis Frederickson said after speaking with [place] on Friday 8/12, pt has decided that he wants to go back with DME [facility] due to them having a lighter weight concentrator that pt was wanting. She said that she called [place] after pt made up his mind about this and cancelled the O2 order with [employer].

## 2020-09-03 NOTE — Telephone Encounter (Signed)
Spoke with PCCs about the call had with pt's daughter. Per Marilynne Halsted, she would call [facility] to see if a new order will need to be placed or if the prior order is still good.  Tasia heard from [facility] and there is not needing to be a new order but a form needs to be filled out and signed by Dr. Verlee Monte and have it sent back to [facility]. Dr. Verlee Monte is not back in the office until Wednesday 8/17 which Tasia updated [employer] rep and rep said that he would also update pt.

## 2020-09-04 ENCOUNTER — Telehealth: Payer: Self-pay | Admitting: Student

## 2020-09-04 NOTE — Telephone Encounter (Signed)
Bryan Hamilton was wanting to know if the prescription that he sent over for oxygen was filled out and he is just checking the status of it.  Pls regard; [phone]

## 2020-09-04 NOTE — Telephone Encounter (Signed)
LMTCB   Dr Verlee Monte returns to the office 8/17.   Will route to triage to see if form has been received.

## 2020-09-05 NOTE — Telephone Encounter (Signed)
We have 2 copies of this form Dr Verlee Monte has the folder

## 2020-09-05 NOTE — Telephone Encounter (Signed)
Called [facility] but he did not answer. Left detailed message for Erlene Quan to call us back and to refax the form.

## 2020-09-06 ENCOUNTER — Telehealth: Payer: Self-pay | Admitting: Internal Medicine

## 2020-09-06 NOTE — Telephone Encounter (Signed)
Able to answer question instead of leaving a message.

## 2020-09-07 ENCOUNTER — Other Ambulatory Visit (HOSPITAL_COMMUNITY)
Admission: RE | Admit: 2020-09-07 | Discharge: 2020-09-07 | Disposition: A | Discharge status: Home / Self Care | Payer: Medicare Other | Source: Ambulatory Visit | Attending: Student | Admitting: Student

## 2020-09-07 ENCOUNTER — Other Ambulatory Visit: Payer: Self-pay

## 2020-09-07 DIAGNOSIS — Z20822 Contact with and (suspected) exposure to covid-19: Secondary | ICD-10-CM | POA: Insufficient documentation

## 2020-09-07 DIAGNOSIS — Z01812 Encounter for preprocedural laboratory examination: Secondary | ICD-10-CM | POA: Insufficient documentation

## 2020-09-07 LAB — SARS CORONAVIRUS 2 (TAT 6-24 HRS): SARS Coronavirus 2: NEGATIVE

## 2020-09-09 NOTE — Progress Notes (Signed)
Cardiology Office Note   Date:  09/10/2020   ID:  Bryan Hamilton, DOB 30-Aug-1945, MRN [id number]  PCP:  Celene Squibb, MD    No chief complaint on file.  AFib  Wt Readings from Last 3 Encounters:  09/10/20 216 lb 3.2 oz (98.1 kg)  08/30/20 214 lb 12.8 oz (97.4 kg)  08/23/20 220 lb (99.8 kg)       History of Present Illness: Bryan Hamilton is a 75 y.o. male   Who was previously seen by Dr. Bronson Ing for AFib.  No h/o CAD.  Records showed: "The patient presents for posthospitalization follow-up for rapid atrial fibrillation.  I consulted on him on 05/13/2019.   He is here with his daughter, Verdis Frederickson, who would also like to be contacted with any information regarding her father's health [phone]). She plans to move here from [facility] to help take care of her father. He has another daughter who lives in [place] with her family.  That daughter has atrial fibrillation and recently underwent cardioversion."   He has chronic SHOB from COPD.  He was started on ELiquis in 4/21.  Rate control with metoprolol 50 mg BID and Cardizem 240 mg CD.  HR was 108 on this regimen in May 2021 so Cardizem was increased to 300 mg daily.    Echo in 2021 April showed: "Left ventricular ejection fraction, by estimation, is 60 to 65%. The  left ventricle has normal function. Left ventricular endocardial border  not optimally defined to evaluate regional wall motion. There is mild left  ventricular hypertrophy. Left  ventricular diastolic parameters are indeterminate.   2. Right ventricular systolic function is normal. The right ventricular  size is mildly enlarged. There is moderately elevated pulmonary artery  systolic pressure.   3. Left atrial size was mildly dilated.   4. The mitral valve is normal in structure. No evidence of mitral valve  regurgitation. No evidence of mitral stenosis.   5. The aortic valve is tricuspid. Aortic valve regurgitation is not  visualized. No aortic  stenosis is present.   6. The inferior vena cava is normal in size with greater than 50%  respiratory variability, suggesting right atrial pressure of 3 mmHg. "  Plan in 5/22 was : "AFib: rate control.  Eliquis for stroke prevention. No palpitations or bleding issues.  HTN: The current medical regimen is effective;  continue present plan and medications.  Whole food, plant based diet recommended.  Chronic diastolic heart failure: Increase Lasix 40 mg BID prn.  I explained the scenario is when he would take an extra furosemide tablet.  He does weigh himself daily.  He will watch for worsening lower extremity edema or shortness of breath.  Elevate legs as well to help with edema.  Low-salt diet.  Avoid processed foods. Hyperlipidemia: The current medical regimen is effective;  continue present plan and medications. COPD: Continue inhalers."   Seen in the ER on August 23, 2020 with the following history: " c/o bilateral leg swelling R>L. States legs stay swollen, but is new to have right leg more swollen than left. Symptoms acute onset in past week, constant, moderate. Mild erythema to anterior right lower leg. No pain to leg. No numbness/weakness. No chest pain. States feels breathing at baseline, states always some sob and doe due to copd. Former smoker. Denies chest pain or discomfort or any pleuritic pain. No new or increased wob. ?orthopnea. No pnd. Use albuterol tx prn. No hx dvt or  pe, and indicates is compliant w eliquis therapy. "  No DVT by ultrasound.  Normal BNP as well.  Denies : Chest pain. Dizziness. Leg edema. Nitroglycerin use. Orthopnea. Palpitations. Paroxysmal nocturnal dyspnea.  Syncope.    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     Dysrhythmia    afib    GERD (gastroesophageal reflux disease)    Hyperchloremia    Hypertension    PONV (postoperative nausea and vomiting)    Prostate cancer [facility])     Past Surgical History:  Procedure Laterality Date   BACK SURGERY     CYSTOSCOPY N/A 10/14/2019   Procedure: CYSTOSCOPY FLEXIBLE;  Surgeon: Cleon Gustin, MD;  Location: [facility];  Service: Urology;  Laterality: N/A;  NO SEEDS FOUND IN BLADDER   PROSTATE BIOPSY N/A 07/20/2019   Procedure: PROSTATE BIOPSY;  Surgeon: Cleon Gustin, MD;  Location: [facility];  Service: Urology;  Laterality: N/A;   RADIOACTIVE SEED IMPLANT N/A 10/14/2019   Procedure: RADIOACTIVE SEED IMPLANT/BRACHYTHERAPY IMPLANT;  Surgeon: Cleon Gustin, MD;  Location: [facility];  Service: Urology;  Laterality: N/A;    69  SEEDS IMPLANTED   SPACE OAR INSTILLATION N/A 10/14/2019   Procedure: SPACE OAR INSTILLATION;  Surgeon: Cleon Gustin, MD;  Location: [facility];  Service: Urology;  Laterality: N/A;     Current Outpatient Medications  Medication Sig Dispense Refill   acetaminophen (TYLENOL) 650 MG CR tablet Take 1,300 mg by mouth 2 (two) times daily as needed for pain.     alfuzosin (UROXATRAL) 10 MG 24 hr tablet Take 1 tablet (10 mg total) by mouth daily with breakfast. 90 tablet 3   ALPRAZolam (XANAX) 0.25 MG tablet Take 0.25 mg by mouth daily as needed for anxiety.      apixaban (ELIQUIS) 5 MG TABS tablet Take 1 tablet (5 mg total) by mouth 2 (two) times daily. 60 tablet 11   atorvastatin (LIPITOR) 20 MG tablet Take 20 mg by mouth at bedtime.      cephALEXin (KEFLEX) 500 MG capsule Take 1 capsule (500 mg total) by mouth 4 (four) times daily. 28 capsule 0   diclofenac Sodium (VOLTAREN) 1 % GEL Apply 2 g topically daily as needed (Pain).     diltiazem (CARDIZEM CD) 300 MG 24 hr capsule Take 1 capsule (300 mg total) by mouth daily. 90 capsule 3   furosemide (LASIX) 40 MG tablet Take  1 tablet (40 mg total) by mouth 2 (two) times daily as needed. 180 tablet 1   metFORMIN (GLUCOPHAGE-XR) 500 MG 24 hr tablet Take 500 mg by mouth daily.     metoprolol tartrate (LOPRESSOR) 50 MG tablet Take 50 mg by mouth 2 (two) times daily.     Omega-3 Fatty Acids (OMEGA 3 PO) Take 4 capsules by mouth daily. Last dose on 09/29/19     Polyethyl Glycol-Propyl Glycol (SYSTANE) 0.4-0.3 % SOLN Place 1-2 drops into both eyes daily as needed (Dry eye).      PROAIR HFA 108 (90 Base) MCG/ACT inhaler Inhale 2 puffs into the lungs every 6 (six) hours as needed for wheezing or shortness of breath.  sertraline (ZOLOFT) 25 MG tablet Take 25 mg by mouth at bedtime.      traMADol (ULTRAM) 50 MG tablet Take 1 tablet (50 mg total) by mouth every 6 (six) hours as needed for moderate pain. 12 tablet 0   TRELEGY ELLIPTA 100-62.5-25 MCG/INH AEPB Inhale 1 puff into the lungs daily.     No current facility-administered medications for this visit.    Allergies:   Patient has no known allergies.    Social History:  The patient  reports that he quit smoking about 12 years ago. His smoking use included cigarettes. He has a 80.00 pack-year smoking history. He has never used smokeless tobacco. He reports current alcohol use. He reports that he does not use drugs.   Family History:  The patient's family history includes Cancer in his father.    ROS:  Please see the history of present illness.   Otherwise, review of systems are positive for leg swelling, right greater than left.   All other systems are reviewed and negative.    PHYSICAL EXAM: VS:  BP 108/70   Pulse 64   Ht '5\' 8"'$  (1.727 m)   Wt 216 lb 3.2 oz (98.1 kg)   SpO2 92%   BMI 32.87 kg/m  , BMI Body mass index is 32.87 kg/m. GEN: Well nourished, well developed, in no acute distress HEENT: normal Neck: no JVD, carotid bruits, or masses Cardiac: Irregularly irregular; no murmurs, rubs, or gallops,no edema  Respiratory:  clear to auscultation  bilaterally, normal work of breathing GI: soft, nontender, nondistended, + BS MS: no deformity or atrophy; right leg swelling Skin: warm and dry, no rash Neuro:  Strength and sensation are intact Psych: euthymic mood, full affect   Recent Labs: 08/23/2020: ALT 15; B Natriuretic Peptide 94.0; BUN 34; Creatinine, Ser 1.45; Hemoglobin 13.7; Platelets 197; Potassium 4.3; Sodium 135   Lipid Panel No results found for: CHOL, TRIG, HDL, CHOLHDL, VLDL, LDLCALC, LDLDIRECT   Other studies Reviewed: Additional studies/ records that were reviewed today with results demonstrating: ER records reviewed.   ASSESSMENT AND PLAN:  AFib: Rate controlled.  Given swelling, will decrease Cardizem CD to 180 mg daily.  If heart rate increases, can increase metoprolol.  Due to the swelling, I want to decrease his calcium channel blocker.  Continue using compression stockings for his lower extremity edema and elevating his legs as well.  Negative ultrasound for DVT while in the emergency room. HTN: The current medical regimen is effective;  continue present plan and medications. Chronic diastolic heart failure: Has been using Lasix more for swelling.  Check BMet.  BNP was normal.  He appears euvolemic with the exception of the right leg swelling.  This may be more venous insufficiency as opposed to any kind of heart failure.  If symptoms persist, will need to repeat echocardiogram.  Decrease salt intake as well. Hyperlipidemia: The current medical regimen is effective;  continue present plan and medications. Anticoagulated: No bleeding issues.  Continue Eliquis.     Current medicines are reviewed at length with the patient today.  The patient concerns regarding his medicines were addressed.  The following changes have been made:  No change  Labs/ tests ordered today include:  No orders of the defined types were placed in this encounter.   Recommend 150 minutes/week of aerobic exercise Low fat, low carb, high  fiber diet recommended  Disposition:   FU in 3 months as scheduled   Signed, Larae Grooms, MD  09/10/2020 8:48 AM  [facility] [address], [place], [place]  [address] Phone: [phone]; Fax: [phone]

## 2020-09-10 ENCOUNTER — Ambulatory Visit: Payer: Medicare Other | Admitting: Interventional Cardiology

## 2020-09-10 ENCOUNTER — Encounter: Payer: Self-pay | Admitting: Interventional Cardiology

## 2020-09-10 ENCOUNTER — Other Ambulatory Visit: Payer: Self-pay

## 2020-09-10 VITALS — BP 108/70 | HR 64 | Ht 68.0 in | Wt 216.2 lb

## 2020-09-10 DIAGNOSIS — E782 Mixed hyperlipidemia: Secondary | ICD-10-CM

## 2020-09-10 DIAGNOSIS — I10 Essential (primary) hypertension: Secondary | ICD-10-CM

## 2020-09-10 DIAGNOSIS — I4891 Unspecified atrial fibrillation: Secondary | ICD-10-CM

## 2020-09-10 DIAGNOSIS — I5032 Chronic diastolic (congestive) heart failure: Secondary | ICD-10-CM | POA: Diagnosis not present

## 2020-09-10 DIAGNOSIS — Z79899 Other long term (current) drug therapy: Secondary | ICD-10-CM | POA: Diagnosis not present

## 2020-09-10 LAB — BASIC METABOLIC PANEL
BUN/Creatinine Ratio: 16 (ref 10–24)
BUN: 21 mg/dL (ref 8–27)
CO2: 27 mmol/L (ref 20–29)
Calcium: 9.3 mg/dL (ref 8.6–10.2)
Chloride: 95 mmol/L — ABNORMAL LOW (ref 96–106)
Creatinine, Ser: 1.28 mg/dL — ABNORMAL HIGH (ref 0.76–1.27)
Glucose: 108 mg/dL — ABNORMAL HIGH (ref 65–99)
Potassium: 4.2 mmol/L (ref 3.5–5.2)
Sodium: 136 mmol/L (ref 134–144)
eGFR: 58 mL/min/{1.73_m2} — ABNORMAL LOW (ref >59)

## 2020-09-10 MED ORDER — DILTIAZEM HCL ER COATED BEADS 180 MG PO CP24: 180.0000 mg | ORAL_CAPSULE | Freq: Every day | ORAL | 3 refills | Status: DC

## 2020-09-10 NOTE — Patient Instructions (Signed)
Medication Instructions: *If you need a refill on your cardiac medications before your next appointment, please call your pharmacy*  Change Diltiazem to 180 mg daily    Lab Work: BMETtoday   If you have labs (blood work) drawn today and your tests are completely normal, you will receive your results only by: [place] (if you have MyChart) OR A paper copy in the mail If you have any lab test that is abnormal or we need to change your treatment, we will call you to review the results.   Testing/Procedures: none   Follow-Up: At [facility], you and your health needs are our priority.  As part of our continuing mission to provide you with exceptional heart care, we have created designated Provider Care Teams.  These Care Teams include your primary Cardiologist (physician) and Advanced Practice Providers (APPs -  Physician Assistants and Nurse Practitioners) who all work together to provide you with the care you need, when you need it.  We recommend signing up for the patient portal called "MyChart".  Sign up information is provided on this After Visit Summary.  MyChart is used to connect with patients for Virtual Visits (Telemedicine).  Patients are able to view lab/test results, encounter notes, upcoming appointments, etc.  Non-urgent messages can be sent to your provider as well.   To learn more about what you can do with MyChart, go to [email or web address].    Your next appointment: November with Dr. Irish Lack

## 2020-09-11 ENCOUNTER — Ambulatory Visit (HOSPITAL_COMMUNITY)
Admission: RE | Admit: 2020-09-11 | Discharge: 2020-09-11 | Disposition: A | Discharge status: Home / Self Care | Payer: Medicare Other | Source: Ambulatory Visit | Attending: Student | Admitting: Student

## 2020-09-11 DIAGNOSIS — R0600 Dyspnea, unspecified: Secondary | ICD-10-CM | POA: Insufficient documentation

## 2020-09-11 DIAGNOSIS — J441 Chronic obstructive pulmonary disease with (acute) exacerbation: Secondary | ICD-10-CM | POA: Insufficient documentation

## 2020-09-11 DIAGNOSIS — R0609 Other forms of dyspnea: Secondary | ICD-10-CM

## 2020-09-11 LAB — PULMONARY FUNCTION TEST
DL/VA % pred: 56 %
DL/VA: 2.25 ml/min/mmHg/L
DLCO unc % pred: 35 %
DLCO unc: 8.33 ml/min/mmHg
FEF 25-75 Post: 0.39 L/sec
FEF 25-75 Pre: 0.38 L/sec
FEF2575-%Change-Post: 3 %
FEF2575-%Pred-Post: 20 %
FEF2575-%Pred-Pre: 19 %
FEV1-%Change-Post: 3 %
FEV1-%Pred-Post: 44 %
FEV1-%Pred-Pre: 43 %
FEV1-Post: 1.22 L
FEV1-Pre: 1.18 L
FEV1FVC-%Change-Post: 9 %
FEV1FVC-%Pred-Pre: 71 %
FEV6-%Change-Post: -1 %
FEV6-%Pred-Post: 56 %
FEV6-%Pred-Pre: 57 %
FEV6-Post: 2 L
FEV6-Pre: 2.03 L
FEV6FVC-%Change-Post: 3 %
FEV6FVC-%Pred-Post: 99 %
FEV6FVC-%Pred-Pre: 95 %
FVC-%Change-Post: -5 %
FVC-%Pred-Post: 56 %
FVC-%Pred-Pre: 59 %
FVC-Post: 2.15 L
FVC-Pre: 2.27 L
Post FEV1/FVC ratio: 57 %
Post FEV6/FVC ratio: 93 %
Pre FEV1/FVC ratio: 52 %
Pre FEV6/FVC Ratio: 89 %
RV % pred: 152 %
RV: 3.8 L
TLC % pred: 93 %
TLC: 6.23 L

## 2020-09-11 LAB — BLOOD GAS, ARTERIAL
Acid-Base Excess: 3.2 mmol/L — ABNORMAL HIGH (ref 0.0–2.0)
Bicarbonate: 27.5 mmol/L (ref 20.0–28.0)
Drawn by: 4179
FIO2: 21
O2 Saturation: 86 %
Patient temperature: 37
pCO2 arterial: 45 mmHg (ref 32.0–48.0)
pH, Arterial: 7.404 (ref 7.350–7.450)
pO2, Arterial: 54.4 mmHg — ABNORMAL LOW (ref 83.0–108.0)

## 2020-09-11 MED ORDER — ALBUTEROL SULFATE (2.5 MG/3ML) 0.083% IN NEBU
2.5000 mg | INHALATION_SOLUTION | Freq: Once | RESPIRATORY_TRACT | Status: AC
Start: 2020-09-11 — End: 2020-09-11
  Administered 2020-09-11: 2.5 mg via RESPIRATORY_TRACT

## 2020-09-11 NOTE — Progress Notes (Signed)
Patient in today for ABG  on Room.  Done in and in computer.. Pt oxygen saturation on Pulse Machine is  86%-88% on RA

## 2020-09-12 ENCOUNTER — Telehealth: Payer: Self-pay | Admitting: Student

## 2020-09-12 DIAGNOSIS — G4719 Other hypersomnia: Secondary | ICD-10-CM

## 2020-09-12 DIAGNOSIS — J449 Chronic obstructive pulmonary disease, unspecified: Secondary | ICD-10-CM

## 2020-09-12 NOTE — Telephone Encounter (Signed)
Called and discussed PFT, ABG results with pt's daughter. Will refer to pulmonary rehab at [facility]. Will start with home sleep apnea test due to his underlying anxiety and anticipated difficulty sleeping in the lab.  [place]

## 2020-09-27 DIAGNOSIS — Z23 Encounter for immunization: Secondary | ICD-10-CM | POA: Diagnosis not present

## 2020-09-27 DIAGNOSIS — E1165 Type 2 diabetes mellitus with hyperglycemia: Secondary | ICD-10-CM | POA: Diagnosis not present

## 2020-09-27 DIAGNOSIS — I10 Essential (primary) hypertension: Secondary | ICD-10-CM | POA: Diagnosis not present

## 2020-10-02 DIAGNOSIS — R0602 Shortness of breath: Secondary | ICD-10-CM | POA: Diagnosis not present

## 2020-10-02 DIAGNOSIS — I480 Paroxysmal atrial fibrillation: Secondary | ICD-10-CM | POA: Diagnosis not present

## 2020-10-02 DIAGNOSIS — E1165 Type 2 diabetes mellitus with hyperglycemia: Secondary | ICD-10-CM | POA: Diagnosis not present

## 2020-10-02 DIAGNOSIS — I509 Heart failure, unspecified: Secondary | ICD-10-CM | POA: Diagnosis not present

## 2020-10-02 DIAGNOSIS — I10 Essential (primary) hypertension: Secondary | ICD-10-CM | POA: Diagnosis not present

## 2020-10-02 DIAGNOSIS — J449 Chronic obstructive pulmonary disease, unspecified: Secondary | ICD-10-CM | POA: Diagnosis not present

## 2020-10-02 DIAGNOSIS — R0600 Dyspnea, unspecified: Secondary | ICD-10-CM | POA: Diagnosis not present

## 2020-10-02 DIAGNOSIS — E8770 Fluid overload, unspecified: Secondary | ICD-10-CM | POA: Diagnosis not present

## 2020-10-02 DIAGNOSIS — E782 Mixed hyperlipidemia: Secondary | ICD-10-CM | POA: Diagnosis not present

## 2020-10-02 DIAGNOSIS — Z23 Encounter for immunization: Secondary | ICD-10-CM | POA: Diagnosis not present

## 2020-10-04 ENCOUNTER — Other Ambulatory Visit: Payer: Self-pay | Admitting: *Deleted

## 2020-10-04 DIAGNOSIS — Z87891 Personal history of nicotine dependence: Secondary | ICD-10-CM

## 2020-10-31 ENCOUNTER — Ambulatory Visit (INDEPENDENT_AMBULATORY_CARE_PROVIDER_SITE_OTHER): Payer: Medicare Other | Admitting: Acute Care

## 2020-10-31 ENCOUNTER — Encounter: Payer: Self-pay | Admitting: Acute Care

## 2020-10-31 ENCOUNTER — Ambulatory Visit (HOSPITAL_COMMUNITY)
Admission: RE | Admit: 2020-10-31 | Discharge: 2020-10-31 | Disposition: A | Discharge status: Home / Self Care | Payer: Medicare Other | Source: Ambulatory Visit | Attending: Dermatology | Admitting: Dermatology

## 2020-10-31 ENCOUNTER — Other Ambulatory Visit: Payer: Self-pay

## 2020-10-31 DIAGNOSIS — Z87891 Personal history of nicotine dependence: Secondary | ICD-10-CM | POA: Insufficient documentation

## 2020-10-31 NOTE — Progress Notes (Signed)
Virtual Visit via Telephone Note  I connected with Bryan Hamilton on 10/31/20 at 10:30 AM EDT by telephone and verified that I am speaking with the correct person using two identifiers.  Location: Patient: At home Provider: [address], [place], [place], [address]    I discussed the limitations, risks, security and privacy concerns of performing an evaluation and management service by telephone and the availability of in person appointments. I also discussed with the patient that there may be a patient responsible charge related to this service. The patient expressed understanding and agreed to proceed.    Shared Decision Making Visit Lung Cancer Screening Program [id number])   Eligibility: Age 75 y.o. Pack Years Smoking History Calculation 76 pack year smoking hisotry (# packs/per year x # years smoked) Recent History of coughing up blood  no Unexplained weight loss? no ( >Than 15 pounds within the last 6 months ) Prior History Lung / other cancer no (Diagnosis within the last 5 years already requiring surveillance chest CT Scans). Smoking Status Former Smoker Former Smokers: Years since quit: 10 years  Quit Date: 2012  Visit Components: Discussion included one or more decision making aids. yes Discussion included risk/benefits of screening. yes Discussion included potential follow up diagnostic testing for abnormal scans. yes Discussion included meaning and risk of over diagnosis. yes Discussion included meaning and risk of False Positives. yes Discussion included meaning of total radiation exposure. yes  Counseling Included: Importance of adherence to annual lung cancer LDCT screening. yes Impact of comorbidities on ability to participate in the program. yes Ability and willingness to under diagnostic treatment. yes  Smoking Cessation Counseling: Current Smokers:  Discussed importance of smoking cessation. yes Information about tobacco cessation classes and  interventions provided to patient. yes Patient provided with "ticket" for LDCT Scan. yes Symptomatic Patient. no  Counseling Diagnosis Code: Tobacco Use Z72.0 Asymptomatic Patient yes  Counseling (Intermediate counseling: > three minutes counseling) L3810 Former Smokers:  Discussed the importance of maintaining cigarette abstinence. yes Diagnosis Code: Personal History of Nicotine Dependence. F75.102 Information about tobacco cessation classes and interventions provided to patient. Yes Patient provided with "ticket" for LDCT Scan. yes Written Order for Lung Cancer Screening with LDCT placed in Epic. Yes (CT Chest Lung Cancer Screening Low Dose W/O CM) [id number] Z12.2-Screening of respiratory organs Z87.891-Personal history of nicotine dependence  I spent 25 minutes of face to face time with Bryan Hamilton  discussing the risks and benefits of lung cancer screening. We viewed a power point together that explained in detail the above noted topics. We took the time to pause the power point at intervals to allow for questions to be asked and answered to ensure understanding. We discussed that he had taken the single most powerful action possible to decrease his risk of developing lung cancer when he quit smoking. I counseled him to remain smoke free, and to contact me if he ever had the desire to smoke again so that I can provide resources and tools to help support the effort to remain smoke free. We discussed the time and location of the scan, and that either  [name] RN or I will call with the results within  24-48 hours of receiving them. He has my card and contact information in the event He needs to speak with me, in addition to a copy of the power point we reviewed as a resource. He verbalized understanding of all of the above and had no further questions upon leaving  the office.     I explained to the patient that there has been a high incidence of coronary artery disease noted on  these exams. I explained that this is a non-gated exam therefore degree or severity cannot be determined. This patient is on statin therapy. I have asked the patient to follow-up with their PCP regarding any incidental finding of coronary artery disease and management with diet or medication as they feel is clinically indicated. The patient verbalized understanding of the above and had no further questions.     Magdalen Spatz, NP 10/31/2020

## 2020-11-12 ENCOUNTER — Telehealth: Payer: Self-pay | Admitting: Acute Care

## 2020-11-12 NOTE — Telephone Encounter (Signed)
Pt's daughter Verdis Frederickson [facility]) informed of CT results per Eric Form, NP.  She verbalized understanding.  Copy of CT sent to PCP.  Advised that pt will no longer qualify for lung cancer screening due to age.

## 2020-11-19 DIAGNOSIS — I10 Essential (primary) hypertension: Secondary | ICD-10-CM | POA: Diagnosis not present

## 2020-11-26 NOTE — Progress Notes (Deleted)
Cardiology Office Note   Date:  11/26/2020   ID:  Bryan Hamilton, DOB 02/22/1945, MRN [id number]  PCP:  Celene Squibb, MD    No chief complaint on file.  AFib  Wt Readings from Last 3 Encounters:  09/10/20 216 lb 3.2 oz (98.1 kg)  08/30/20 214 lb 12.8 oz (97.4 kg)  08/23/20 220 lb (99.8 kg)       History of Present Illness: Bryan Hamilton is a 75 y.o. male    Who was previously seen by Dr. Bronson Ing for AFib.  No h/o CAD.  Records showed: "The patient presents for posthospitalization follow-up for rapid atrial fibrillation.  I consulted on him on 05/13/2019.   He is here with his daughter, Verdis Frederickson, who would also like to be contacted with any information regarding her father's health [phone]). She plans to move here from [facility] to help take care of her father. He has another daughter who lives in [place] with her family.  That daughter has atrial fibrillation and recently underwent cardioversion."   He has chronic SHOB from COPD.  He was started on ELiquis in 4/21.  Rate control with metoprolol 50 mg BID and Cardizem 240 mg CD.  HR was 108 on this regimen in May 2021 so Cardizem was increased to 300 mg daily.    Echo in 2021 April showed: "Left ventricular ejection fraction, by estimation, is 60 to 65%. The  left ventricle has normal function. Left ventricular endocardial border  not optimally defined to evaluate regional wall motion. There is mild left  ventricular hypertrophy. Left  ventricular diastolic parameters are indeterminate.   2. Right ventricular systolic function is normal. The right ventricular  size is mildly enlarged. There is moderately elevated pulmonary artery  systolic pressure.   3. Left atrial size was mildly dilated.   4. The mitral valve is normal in structure. No evidence of mitral valve  regurgitation. No evidence of mitral stenosis.   5. The aortic valve is tricuspid. Aortic valve regurgitation is not  visualized. No aortic  stenosis is present.   6. The inferior vena cava is normal in size with greater than 50%  respiratory variability, suggesting right atrial pressure of 3 mmHg. "   Plan in 5/22 was : "AFib: rate control.  Eliquis for stroke prevention. No palpitations or bleding issues.  HTN: The current medical regimen is effective;  continue present plan and medications.  Whole food, plant based diet recommended.  Chronic diastolic heart failure: Increase Lasix 40 mg BID prn.  I explained the scenario is when he would take an extra furosemide tablet.  He does weigh himself daily.  He will watch for worsening lower extremity edema or shortness of breath.  Elevate legs as well to help with edema.  Low-salt diet.  Avoid processed foods. Hyperlipidemia: The current medical regimen is effective;  continue present plan and medications. COPD: Continue inhalers."   Seen in the ER on August 23, 2020 with the following history: " c/o bilateral leg swelling R>L. States legs stay swollen, but is new to have right leg more swollen than left. Symptoms acute onset in past week, constant, moderate. Mild erythema to anterior right lower leg. No pain to leg. No numbness/weakness. No chest pain. States feels breathing at baseline, states always some sob and doe due to copd. Former smoker. Denies chest pain or discomfort or any pleuritic pain. No new or increased wob. ?orthopnea. No pnd. Use albuterol tx prn. No hx  dvt or pe, and indicates is compliant w eliquis therapy. "   No DVT by ultrasound.  Normal BNP as well.      Past Medical History:  Diagnosis Date   Anxiety    Arrhythmia    Arthritis    Asthma    Atrial fibrillation (HCC)    COPD (chronic obstructive pulmonary disease) ([place])    Depression    Diabetes mellitus without complication (HCC)    tpe 2    Dysrhythmia    afib    GERD (gastroesophageal reflux disease)    Hyperchloremia    Hypertension    PONV (postoperative nausea and vomiting)    Prostate cancer  [facility])     Past Surgical History:  Procedure Laterality Date   BACK SURGERY     CYSTOSCOPY N/A 10/14/2019   Procedure: CYSTOSCOPY FLEXIBLE;  Surgeon: Cleon Gustin, MD;  [facility];  Service: Urology;  Laterality: N/A;  NO SEEDS FOUND IN BLADDER   PROSTATE BIOPSY N/A 07/20/2019   Procedure: PROSTATE BIOPSY;  Surgeon: Cleon Gustin, MD;  Location: [facility];  Service: Urology;  Laterality: N/A;   RADIOACTIVE SEED IMPLANT N/A 10/14/2019   Procedure: RADIOACTIVE SEED IMPLANT/BRACHYTHERAPY IMPLANT;  Surgeon: Cleon Gustin, MD;  Location: [facility];  Service: Urology;  Laterality: N/A;    69  SEEDS IMPLANTED   SPACE OAR INSTILLATION N/A 10/14/2019   Procedure: SPACE OAR INSTILLATION;  Surgeon: Cleon Gustin, MD;  Location: [facility];  Service: Urology;  Laterality: N/A;     Current Outpatient Medications  Medication Sig Dispense Refill   acetaminophen (TYLENOL) 650 MG CR tablet Take 1,300 mg by mouth 2 (two) times daily as needed for pain.     alfuzosin (UROXATRAL) 10 MG 24 hr tablet Take 1 tablet (10 mg total) by mouth daily with breakfast. 90 tablet 3   ALPRAZolam (XANAX) 0.25 MG tablet Take 0.25 mg by mouth daily as needed for anxiety.      apixaban (ELIQUIS) 5 MG TABS tablet Take 1 tablet (5 mg total) by mouth 2 (two) times daily. 60 tablet 11   atorvastatin (LIPITOR) 20 MG tablet Take 20 mg by mouth at bedtime.      cephALEXin (KEFLEX) 500 MG capsule Take 1 capsule (500 mg total) by mouth 4 (four) times daily. 28 capsule 0   diclofenac Sodium (VOLTAREN) 1 % GEL Apply 2 g topically daily as needed (Pain).     diltiazem (CARDIZEM CD) 180 MG 24 hr capsule Take 1 capsule (180 mg total) by mouth daily. 90 capsule 3   furosemide (LASIX) 40 MG tablet Take 1 tablet (40 mg total) by mouth 2 (two) times daily as needed. 180 tablet 1   metFORMIN (GLUCOPHAGE-XR) 500 MG 24 hr tablet Take 500 mg by mouth daily.     metoprolol  tartrate (LOPRESSOR) 50 MG tablet Take 50 mg by mouth 2 (two) times daily.     Omega-3 Fatty Acids (OMEGA 3 PO) Take 4 capsules by mouth daily. Last dose on 09/29/19     Polyethyl Glycol-Propyl Glycol (SYSTANE) 0.4-0.3 % SOLN Place 1-2 drops into both eyes daily as needed (Dry eye).      PROAIR HFA 108 (90 Base) MCG/ACT inhaler Inhale 2 puffs into the lungs every 6 (six) hours as needed for wheezing or shortness of breath.      sertraline (ZOLOFT) 25 MG tablet Take 25 mg by mouth at bedtime.      traMADol (  ULTRAM) 50 MG tablet Take 1 tablet (50 mg total) by mouth every 6 (six) hours as needed for moderate pain. 12 tablet 0   TRELEGY ELLIPTA 100-62.5-25 MCG/INH AEPB Inhale 1 puff into the lungs daily.     No current facility-administered medications for this visit.    Allergies:   Patient has no known allergies.    Social History:  The patient  reports that he quit smoking about 12 years ago. His smoking use included cigarettes. He has a 80.00 pack-year smoking history. He has never used smokeless tobacco. He reports current alcohol use. He reports that he does not use drugs.   Family History:  The patient's ***family history includes Cancer in his father.    ROS:  Please see the history of present illness.   Otherwise, review of systems are positive for ***.   All other systems are reviewed and negative.    PHYSICAL EXAM: VS:  There were no vitals taken for this visit. , BMI There is no height or weight on file to calculate BMI. GEN: Well nourished, well developed, in no acute distress HEENT: normal Neck: no JVD, carotid bruits, or masses Cardiac: ***RRR; no murmurs, rubs, or gallops,no edema  Respiratory:  clear to auscultation bilaterally, normal work of breathing GI: soft, nontender, nondistended, + BS MS: no deformity or atrophy Skin: warm and dry, no rash Neuro:  Strength and sensation are intact Psych: euthymic mood, full affect   EKG:   The ekg ordered today demonstrates  ***   Recent Labs: 08/23/2020: ALT 15; B Natriuretic Peptide 94.0; Hemoglobin 13.7; Platelets 197 09/10/2020: BUN 21; Creatinine, Ser 1.28; Potassium 4.2; Sodium 136   Lipid Panel No results found for: CHOL, TRIG, HDL, CHOLHDL, VLDL, LDLCALC, LDLDIRECT   Other studies Reviewed: Additional studies/ records that were reviewed today with results demonstrating: ***.   ASSESSMENT AND PLAN:  Atrial fibrillation: Continue rate control.  Anticoagulation for stroke prevention with Eliquis.  Cardizem CD was decreased due to leg swelling.  Plan was if heart rate increased, metoprolol dose could be increased. Hypertension: Chronic diastolic heart failure: Hyperlipidemia: Anticoagulated:   Current medicines are reviewed at length with the patient today.  The patient concerns regarding his medicines were addressed.  The following changes have been made:  No change***  Labs/ tests ordered today include: *** No orders of the defined types were placed in this encounter.   Recommend 150 minutes/week of aerobic exercise Low fat, low carb, high fiber diet recommended  Disposition:   FU in ***   Signed, Larae Grooms, MD  11/26/2020 6:38 PM    [facility] [place], [place],   [address] Phone: [phone]; Fax: [phone]

## 2020-11-27 ENCOUNTER — Other Ambulatory Visit: Payer: Self-pay

## 2020-11-27 ENCOUNTER — Telehealth: Payer: Self-pay | Admitting: Student

## 2020-11-27 ENCOUNTER — Ambulatory Visit: Payer: Medicare Other

## 2020-11-27 ENCOUNTER — Ambulatory Visit: Payer: Medicare Other | Admitting: Interventional Cardiology

## 2020-11-27 DIAGNOSIS — I4891 Unspecified atrial fibrillation: Secondary | ICD-10-CM

## 2020-11-27 DIAGNOSIS — E782 Mixed hyperlipidemia: Secondary | ICD-10-CM

## 2020-11-27 DIAGNOSIS — I10 Essential (primary) hypertension: Secondary | ICD-10-CM

## 2020-11-27 DIAGNOSIS — I5032 Chronic diastolic (congestive) heart failure: Secondary | ICD-10-CM

## 2020-11-27 NOTE — Telephone Encounter (Signed)
FYI - order was placed for pt to have a hst.  Pt was scheduled for today and dtr came to pick up machine.  She states he is on O2 at night.  She called pt while she was in the office to see if he thought he could go without O2 at night or if he wants to ask for an inlab sleep study to be ordered.  She states pt wants to try to go without O2 at home one night to see if he can and then will let us know if he wants hst.  She states pt does not want to go to sleep lab for study at this time.

## 2020-12-11 ENCOUNTER — Telehealth: Payer: Self-pay | Admitting: Interventional Cardiology

## 2020-12-11 DIAGNOSIS — M7989 Other specified soft tissue disorders: Secondary | ICD-10-CM

## 2020-12-11 DIAGNOSIS — S81801A Unspecified open wound, right lower leg, initial encounter: Secondary | ICD-10-CM | POA: Diagnosis not present

## 2020-12-11 DIAGNOSIS — I5032 Chronic diastolic (congestive) heart failure: Secondary | ICD-10-CM

## 2020-12-11 DIAGNOSIS — E8770 Fluid overload, unspecified: Secondary | ICD-10-CM | POA: Diagnosis not present

## 2020-12-11 NOTE — Telephone Encounter (Signed)
Patient's daughter notified. Patient will come in for lab work on 12/17/20

## 2020-12-11 NOTE — Telephone Encounter (Signed)
I spoke with patient's daughter. She reports patient has been having swelling in both lower legs for awhile.  He now has a blister on his right leg.  Saw primary care today and antibiotic started. Lab work was not done today. He was advised to contact cardiology to see if lasix dose needed adjusting. Daughter reports patient has been taking lasix 40 mg twice daily for at least a month. Had been wearing compression stockings but is not wearing now due to blister. Is trying to keep legs elevated.  He does eat a lot of take out food.  Little canned or frozen food. Drinks mountain dew often. Has chronic shortness of breath and uses home oxygen. No increase in shortness of breath.  Weight fluctuates but no recent gain.  Daughter also reports patient is sleeping a lot and is moody and withdrawn.  I asked her to discuss this with patient's PCP.  Patient missed recent appointment with Dr Irish Lack.  Appointment made for January 16, 2021.  Daughter advised to have patient decrease intake of take out food, salt and soda.  Will forward to Dr Irish Lack for review/recommendations.

## 2020-12-11 NOTE — Telephone Encounter (Signed)
Pt c/o swelling: STAT is pt has developed SOB within 24 hours  How much weight have you gained and in what time span?  Patient's daughter states the patient's weight fluctuates, but he hasn't had any major weight gain   If swelling, where is the swelling located?  Legs, + blistering on right leg  Are you currently taking a fluid pill?  Yes, patient takes furosemide (LASIX) 40 MG twice daily. Patient's daughter would like to know if it needs to be adjusted.   Are you currently SOB?  No   Do you have a log of your daily weights (if so, list)?  They have a log, but it isn't available (they aren't home)  Have you gained 3 pounds in a day or 5 pounds in a week?  Unsure   Have you traveled recently?  No

## 2020-12-11 NOTE — Telephone Encounter (Signed)
He can increase Lasix to 80 mg BID for 3 days, then return to 40 mg BID. Needs BMet and BNP early next week.

## 2020-12-17 ENCOUNTER — Other Ambulatory Visit: Payer: Medicare Other

## 2020-12-17 ENCOUNTER — Emergency Department (HOSPITAL_COMMUNITY)
Admission: EM | Admit: 2020-12-17 | Discharge: 2020-12-17 | Disposition: A | Discharge status: Home / Self Care | Payer: Medicare Other | Attending: Emergency Medicine | Admitting: Emergency Medicine

## 2020-12-17 ENCOUNTER — Other Ambulatory Visit: Payer: Self-pay

## 2020-12-17 ENCOUNTER — Encounter (HOSPITAL_COMMUNITY): Payer: Self-pay | Admitting: *Deleted

## 2020-12-17 DIAGNOSIS — R600 Localized edema: Secondary | ICD-10-CM | POA: Insufficient documentation

## 2020-12-17 DIAGNOSIS — Z5189 Encounter for other specified aftercare: Secondary | ICD-10-CM

## 2020-12-17 DIAGNOSIS — Z7901 Long term (current) use of anticoagulants: Secondary | ICD-10-CM | POA: Insufficient documentation

## 2020-12-17 DIAGNOSIS — J45909 Unspecified asthma, uncomplicated: Secondary | ICD-10-CM | POA: Insufficient documentation

## 2020-12-17 DIAGNOSIS — I4891 Unspecified atrial fibrillation: Secondary | ICD-10-CM | POA: Diagnosis not present

## 2020-12-17 DIAGNOSIS — E119 Type 2 diabetes mellitus without complications: Secondary | ICD-10-CM | POA: Insufficient documentation

## 2020-12-17 DIAGNOSIS — Z8546 Personal history of malignant neoplasm of prostate: Secondary | ICD-10-CM | POA: Diagnosis not present

## 2020-12-17 DIAGNOSIS — Z7951 Long term (current) use of inhaled steroids: Secondary | ICD-10-CM | POA: Insufficient documentation

## 2020-12-17 DIAGNOSIS — I509 Heart failure, unspecified: Secondary | ICD-10-CM | POA: Insufficient documentation

## 2020-12-17 DIAGNOSIS — J449 Chronic obstructive pulmonary disease, unspecified: Secondary | ICD-10-CM | POA: Insufficient documentation

## 2020-12-17 DIAGNOSIS — Z87891 Personal history of nicotine dependence: Secondary | ICD-10-CM | POA: Diagnosis not present

## 2020-12-17 DIAGNOSIS — Z79899 Other long term (current) drug therapy: Secondary | ICD-10-CM | POA: Diagnosis not present

## 2020-12-17 DIAGNOSIS — I110 Hypertensive heart disease with heart failure: Secondary | ICD-10-CM | POA: Insufficient documentation

## 2020-12-17 DIAGNOSIS — Z7984 Long term (current) use of oral hypoglycemic drugs: Secondary | ICD-10-CM | POA: Diagnosis not present

## 2020-12-17 DIAGNOSIS — Z4801 Encounter for change or removal of surgical wound dressing: Secondary | ICD-10-CM | POA: Diagnosis not present

## 2020-12-17 DIAGNOSIS — Z4800 Encounter for change or removal of nonsurgical wound dressing: Secondary | ICD-10-CM | POA: Diagnosis not present

## 2020-12-17 HISTORY — DX: Heart failure, unspecified: I50.9

## 2020-12-17 NOTE — ED Provider Notes (Signed)
[facility] Provider Note   CSN: [id number] Arrival date & time: 12/17/20  1018     History Chief Complaint  Patient presents with   Wound Check    Bryan Hamilton is a 75 y.o. male.   Wound Check Pertinent negatives include no shortness of breath. Patient presents for wound check.  Had a blister on his right lower leg that got popped and torn off with compression socks getting placed.  This happened about 2 weeks ago.  Saw PCP just under a week ago and started on antibiotics.  Comes in for recheck.  Per caregiver has had some drainage that may be purulent but is yellow.  Dressing changed twice today.  Is on Keflex.  No fevers.  Well-appearing.  Patient states it does not really hurt that much.  No warmth.  No fevers.     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    Dysrhythmia    afib    GERD (gastroesophageal reflux disease)    Hyperchloremia    Hypertension    PONV (postoperative nausea and vomiting)    Prostate cancer [facility])     Patient Active Problem List   Diagnosis Date Noted   Nocturia 11/02/2019   Prostate cancer ([place]) 07/27/2019   Elevated PSA 07/13/2019   Right hip pain    New onset atrial fibrillation ([place]) 05/12/2019    Past Surgical History:  Procedure Laterality Date   BACK SURGERY     CYSTOSCOPY N/A 10/14/2019   Procedure: CYSTOSCOPY FLEXIBLE;  Surgeon: Cleon Gustin, MD;  Location: [facility];  Service: Urology;  Laterality: N/A;  NO SEEDS FOUND IN BLADDER   PROSTATE BIOPSY N/A 07/20/2019   Procedure: PROSTATE BIOPSY;  Surgeon: Cleon Gustin, MD;  Location: [facility];  Service: Urology;  Laterality: N/A;   RADIOACTIVE SEED IMPLANT N/A 10/14/2019   Procedure:  RADIOACTIVE SEED IMPLANT/BRACHYTHERAPY IMPLANT;  Surgeon: Cleon Gustin, MD;  Location: [facility];  Service: Urology;  Laterality: N/A;    67  SEEDS IMPLANTED   SPACE OAR INSTILLATION N/A 10/14/2019   Procedure: SPACE OAR INSTILLATION;  Surgeon: Cleon Gustin, MD;  Location: [facility];  Service: Urology;  Laterality: N/A;       Family History  Problem Relation Age of Onset   Cancer Father        type unknown   Breast cancer Neg Hx    Prostate cancer Neg Hx    Colon cancer Neg Hx    Pancreatic cancer Neg Hx     Social History   Tobacco Use   Smoking status: Former    Packs/day: 2.00    Years: 40.00    Pack years: 80.00    Types: Cigarettes    Quit date: 01/21/2008    Years since quitting: 12.9   Smokeless tobacco: Never  Vaping Use   Vaping Use: Never used  Substance Use Topics   Alcohol use: Not Currently    Comment: rarely    Drug use: Never    Home Medications Prior to Admission medications   Medication Sig Start Date End Date Taking? Authorizing Provider  acetaminophen (TYLENOL)  650 MG CR tablet Take 1,300 mg by mouth 2 (two) times daily as needed for pain.    [provider]  alfuzosin (UROXATRAL) 10 MG 24 hr tablet Take 1 tablet (10 mg total) by mouth daily with breakfast. 07/06/20   McKenzie, Candee Furbish, MD  ALPRAZolam Duanne Moron) 0.25 MG tablet Take 0.25 mg by mouth daily as needed for anxiety.  07/12/19   [provider]  apixaban (ELIQUIS) 5 MG TABS tablet Take 1 tablet (5 mg total) by mouth 2 (two) times daily. 07/18/19   Herminio Commons, MD  atorvastatin (LIPITOR) 20 MG tablet Take 20 mg by mouth at bedtime.  03/07/19   [provider]  cephALEXin (KEFLEX) 500 MG capsule Take 1 capsule (500 mg total) by mouth 4 (four) times daily. 08/23/20   Lajean Saver, MD  diclofenac Sodium (VOLTAREN) 1 % GEL Apply 2 g topically daily as needed (Pain).    [provider]  diltiazem (CARDIZEM CD) 180  MG 24 hr capsule Take 1 capsule (180 mg total) by mouth daily. 09/10/20   Jettie Booze, MD  furosemide (LASIX) 40 MG tablet Take 1 tablet (40 mg total) by mouth 2 (two) times daily as needed. 06/15/20   Jettie Booze, MD  metFORMIN (GLUCOPHAGE-XR) 500 MG 24 hr tablet Take 500 mg by mouth daily. 03/14/20   [provider]  metoprolol tartrate (LOPRESSOR) 50 MG tablet Take 50 mg by mouth 2 (two) times daily. 05/16/19   [provider]  Omega-3 Fatty Acids (OMEGA 3 PO) Take 4 capsules by mouth daily. Last dose on 09/29/19    [provider]  Polyethyl Glycol-Propyl Glycol (SYSTANE) 0.4-0.3 % SOLN Place 1-2 drops into both eyes daily as needed (Dry eye).     [provider]  PROAIR HFA 108 [phone] Base) MCG/ACT inhaler Inhale 2 puffs into the lungs every 6 (six) hours as needed for wheezing or shortness of breath.  03/31/19   [provider]  sertraline (ZOLOFT) 25 MG tablet Take 25 mg by mouth at bedtime.  07/12/19   [provider]  traMADol (ULTRAM) 50 MG tablet Take 1 tablet (50 mg total) by mouth every 6 (six) hours as needed for moderate pain. 05/14/19   Orson Eva, MD  TRELEGY ELLIPTA 100-62.5-25 MCG/INH AEPB Inhale 1 puff into the lungs daily. 03/31/19   [provider]    Allergies    Patient has no known allergies.  Review of Systems   Review of Systems  Constitutional:  Negative for appetite change.  HENT:  Negative for congestion.   Respiratory:  Negative for shortness of breath.   Cardiovascular:  Positive for leg swelling.  Genitourinary:  Negative for flank pain.  Musculoskeletal:  Negative for back pain.  Skin:  Positive for wound.  Neurological:  Negative for weakness.  Psychiatric/Behavioral:  Negative for confusion.    Physical Exam Updated Vital Signs BP 116/64 (BP Location: Right Arm)   Pulse 75   Temp 98.5 F (36.9 C) (Oral)   Ht 5\' 8"  (1.727 m)   Wt 99.8 kg   SpO2 97%   BMI 33.45 kg/m   Physical  Exam Vitals and nursing note reviewed.  HENT:     Head: Normocephalic.  Eyes:     Pupils: Pupils are equal, round, and reactive to light.  Cardiovascular:     Rate and Rhythm: Regular rhythm.  Musculoskeletal:     Cervical back: Neck supple.     Right lower leg: Edema  present.     Left lower leg: Edema present.     Comments: Pitting edema bilateral lower extremity.  On the posterior right lower leg there is a approximately 4 cm area with a wound.  No induration.  No current drainage.  No fluctuance.  Per family member the redness that is in the area is always there and is not worse necessarily now.  Neurological:     Mental Status: He is alert.     ED Results / Procedures / Treatments   Labs (all labs ordered are listed, but only abnormal results are displayed) Labs Reviewed - No data to display  EKG None  Radiology No results found.  Procedures Procedures   Medications Ordered in ED Medications - No data to display  ED Course  I have reviewed the triage vital signs and the nursing notes.  Pertinent labs & imaging results that were available during my care of the patient were reviewed by me and considered in my medical decision making (see chart for details).    MDM Rules/Calculators/A&P                           Patient with wound of right lower leg.  Overall well-appearing.  No drainage.  No induration and stable erythema that is unchanged.  Is on Keflex currently.  Does not appear to that is necessarily even infected at this time.  However I will keep the antibiotics given.  Well-appearing.  Follow-up with Dr. [place] Crane as needed.  Continue dressing changes and monitoring.  Doubt a sepsis.  Did not appear to have failed antibiotic coverage Final Clinical Impression(s) / ED Diagnoses Final diagnoses:  Visit for wound check    Rx / DC Orders ED Discharge Orders     None        Davonna Belling, MD 12/17/20 1106

## 2020-12-17 NOTE — Discharge Instructions (Signed)
The wound on leg overall looks good.  Continue the antibiotics you are on.  Follow-up with Dr. [place] Crane as needed.  Watch for increasing redness or swelling.  Watch out for fevers

## 2020-12-17 NOTE — ED Triage Notes (Signed)
Pt c/o wound to right lower leg x 2 weeks. Pt reports he had a blister and when putting his compression socks on it tore open. Pt has been seen by Dr. [place] Crane and given antibiotics, but feels it isn't getting any better.

## 2020-12-31 DIAGNOSIS — E1165 Type 2 diabetes mellitus with hyperglycemia: Secondary | ICD-10-CM | POA: Diagnosis not present

## 2021-01-02 ENCOUNTER — Other Ambulatory Visit: Payer: Self-pay

## 2021-01-02 ENCOUNTER — Other Ambulatory Visit: Payer: Medicare Other

## 2021-01-02 DIAGNOSIS — E782 Mixed hyperlipidemia: Secondary | ICD-10-CM | POA: Diagnosis not present

## 2021-01-02 DIAGNOSIS — R0602 Shortness of breath: Secondary | ICD-10-CM | POA: Diagnosis not present

## 2021-01-02 DIAGNOSIS — E1165 Type 2 diabetes mellitus with hyperglycemia: Secondary | ICD-10-CM | POA: Diagnosis not present

## 2021-01-02 DIAGNOSIS — E8770 Fluid overload, unspecified: Secondary | ICD-10-CM | POA: Diagnosis not present

## 2021-01-02 DIAGNOSIS — Z0001 Encounter for general adult medical examination with abnormal findings: Secondary | ICD-10-CM | POA: Diagnosis not present

## 2021-01-02 DIAGNOSIS — I10 Essential (primary) hypertension: Secondary | ICD-10-CM | POA: Diagnosis not present

## 2021-01-02 DIAGNOSIS — I480 Paroxysmal atrial fibrillation: Secondary | ICD-10-CM | POA: Diagnosis not present

## 2021-01-02 DIAGNOSIS — J449 Chronic obstructive pulmonary disease, unspecified: Secondary | ICD-10-CM | POA: Diagnosis not present

## 2021-01-02 DIAGNOSIS — I509 Heart failure, unspecified: Secondary | ICD-10-CM | POA: Diagnosis not present

## 2021-01-02 DIAGNOSIS — R0600 Dyspnea, unspecified: Secondary | ICD-10-CM | POA: Diagnosis not present

## 2021-01-02 DIAGNOSIS — C61 Malignant neoplasm of prostate: Secondary | ICD-10-CM

## 2021-01-03 LAB — PSA: Prostate Specific Ag, Serum: 0.2 ng/mL (ref 0.0–4.0)

## 2021-01-09 ENCOUNTER — Ambulatory Visit: Payer: Medicare Other | Admitting: Urology

## 2021-01-09 ENCOUNTER — Encounter: Payer: Self-pay | Admitting: Urology

## 2021-01-09 ENCOUNTER — Other Ambulatory Visit: Payer: Self-pay

## 2021-01-09 VITALS — BP 121/69 | HR 87

## 2021-01-09 DIAGNOSIS — C61 Malignant neoplasm of prostate: Secondary | ICD-10-CM

## 2021-01-09 DIAGNOSIS — N401 Enlarged prostate with lower urinary tract symptoms: Secondary | ICD-10-CM

## 2021-01-09 DIAGNOSIS — R351 Nocturia: Secondary | ICD-10-CM

## 2021-01-09 DIAGNOSIS — N138 Other obstructive and reflux uropathy: Secondary | ICD-10-CM

## 2021-01-09 LAB — URINALYSIS, ROUTINE W REFLEX MICROSCOPIC
Bilirubin, UA: NEGATIVE
Glucose, UA: NEGATIVE
Ketones, UA: NEGATIVE
Leukocytes,UA: NEGATIVE
Nitrite, UA: NEGATIVE
Protein,UA: NEGATIVE
RBC, UA: NEGATIVE
Specific Gravity, UA: 1.015 (ref 1.005–1.030)
Urobilinogen, Ur: 0.2 mg/dL (ref 0.2–1.0)
pH, UA: 5.5 (ref 5.0–7.5)

## 2021-01-09 MED ORDER — ALFUZOSIN HCL ER 10 MG PO TB24: 10.0000 mg | ORAL_TABLET | Freq: Every day | ORAL | 3 refills | Status: DC

## 2021-01-09 NOTE — Progress Notes (Signed)
Urological Symptom Review  Patient is experiencing the following symptoms: none   Review of Systems  Gastrointestinal (upper)  : Negative for upper GI symptoms  Gastrointestinal (lower) : Negative for lower GI symptoms  Constitutional : Negative for symptoms  Skin: Negative for skin symptoms  Eyes: Negative for eye symptoms  Ear/Nose/Throat : Negative for Ear/Nose/Throat symptoms  Hematologic/Lymphatic: Negative for Hematologic/Lymphatic symptoms  Cardiovascular : Leg swelling  Respiratory : Shortness of breath  Endocrine: Negative for endocrine symptoms  Musculoskeletal: Back pain  Neurological: Negative for neurological symptoms  Psychologic: Negative for psychiatric symptoms

## 2021-01-09 NOTE — Progress Notes (Signed)
01/09/2021 1:31 PM   Bryan Hamilton 29-Jan-1945 [id number]  Referring provider: Celene Squibb, MD [address],  [place] [address]  Followup prostate cancer and BPH   HPI: Bryan Hamilton is a 75yo here for followup for prostate cancer and BPH.  PSA decreased to 0.2. He has stable mild to moderate LUTS on uroxatral 10mg  daily. IPSS 9 QOL 1. Urine stream strong. Nocturia 1-2x. No straining to urinate. No dysuria. 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;  Service: Urology;  Laterality: N/A;  NO SEEDS FOUND IN BLADDER   PROSTATE BIOPSY N/A 07/20/2019   Procedure: PROSTATE BIOPSY;  Surgeon: Cleon Gustin, MD;  Location: [facility];  Service: Urology;  Laterality: N/A;   RADIOACTIVE SEED IMPLANT N/A 10/14/2019   Procedure: RADIOACTIVE SEED IMPLANT/BRACHYTHERAPY IMPLANT;  Surgeon: Cleon Gustin, MD;  Location[facility];  Service: Urology;  Laterality: N/A;    67  SEEDS IMPLANTED   SPACE OAR INSTILLATION N/A 10/14/2019   Procedure: SPACE OAR INSTILLATION;  Surgeon: Cleon Gustin, MD;  Location: [facility];  Service: Urology;  Laterality: N/A;    Home Medications:  Allergies as of 01/09/2021   No Known Allergies      Medication List        Accurate as of  January 09, 2021  1:31 PM. If you have any questions, ask your nurse or doctor.          acetaminophen 650 MG CR tablet Commonly known as: TYLENOL Take 1,300 mg by mouth 2 (two) times daily as needed for pain.   alfuzosin 10 MG 24 hr tablet Commonly known as: UROXATRAL Take 1 tablet (10 mg total) by mouth daily with breakfast.   ALPRAZolam 0.25 MG tablet Commonly known as: XANAX Take 0.25 mg by mouth daily as needed for anxiety.   apixaban 5 MG Tabs tablet Commonly known as: ELIQUIS Take 1 tablet (5 mg total) by mouth 2 (two) times daily.   atorvastatin 20 MG tablet Commonly known as: LIPITOR Take 20 mg by mouth at bedtime.   cephALEXin 500 MG capsule Commonly known as: Keflex Take 1 capsule (500 mg total) by mouth 4 (four) times daily.   diclofenac Sodium 1 % Gel Commonly known as: VOLTAREN Apply 2 g topically daily as needed (Pain).   diltiazem 180 MG 24 hr capsule Commonly known as: CARDIZEM CD Take 1 capsule (180 mg total) by mouth daily.   furosemide 40 MG tablet Commonly known as: LASIX Take 1 tablet (40 mg total) by mouth 2 (two) times daily as needed.   metFORMIN 500 MG 24 hr tablet Commonly known as: GLUCOPHAGE-XR Take 500 mg  by mouth daily.   metoprolol tartrate 50 MG tablet Commonly known as: LOPRESSOR Take 50 mg by mouth 2 (two) times daily.   OMEGA 3 PO Take 4 capsules by mouth daily. Last dose on 09/29/19   ProAir HFA 108 (90 Base) MCG/ACT inhaler Generic drug: albuterol Inhale 2 puffs into the lungs every 6 (six) hours as needed for wheezing or shortness of breath.   sertraline 25 MG tablet Commonly known as: ZOLOFT Take 25 mg by mouth at bedtime.   Systane 0.4-0.3 % Soln Generic drug: Polyethyl Glycol-Propyl Glycol Place 1-2 drops into both eyes daily as needed (Dry eye).   traMADol 50 MG tablet Commonly known as: ULTRAM Take 1 tablet (50 mg total) by mouth every 6 (six) hours as needed for moderate pain.   Trelegy Ellipta  100-62.5-25 MCG/ACT Aepb Generic drug: Fluticasone-Umeclidin-Vilant Inhale 1 puff into the lungs daily.        Allergies: No Known Allergies  Family History: Family History  Problem Relation Age of Onset   Cancer Father        type unknown   Breast cancer Neg Hx    Prostate cancer Neg Hx    Colon cancer Neg Hx    Pancreatic cancer Neg Hx     Social History:  reports that he quit smoking about 12 years ago. His smoking use included cigarettes. He has a 80.00 pack-year smoking history. He has never used smokeless tobacco. He reports that he does not currently use alcohol. He reports that he does not use drugs.  ROS: All other review of systems were reviewed and are negative except what is noted above in HPI  Physical Exam: BP 121/69    Pulse 87   Constitutional:  Alert and oriented, No acute distress. HEENT: [place] AT, moist mucus membranes.  Trachea midline, no masses. Cardiovascular: No clubbing, cyanosis, or edema. Respiratory: Normal respiratory effort, no increased work of breathing. GI: Abdomen is soft, nontender, nondistended, no abdominal masses GU: No CVA tenderness.  Lymph: No cervical or inguinal lymphadenopathy. Skin: No rashes, bruises or suspicious lesions. Neurologic: Grossly intact, no focal deficits, moving all 4 extremities. Psychiatric: Normal mood and affect.  Laboratory Data: Lab Results  Component Value Date   WBC 8.9 08/23/2020   HGB 13.7 08/23/2020   HCT 43.9 08/23/2020   MCV 97.1 08/23/2020   PLT 197 08/23/2020    Lab Results  Component Value Date   CREATININE 1.28 (H) 09/10/2020    No results found for: PSA  No results found for: TESTOSTERONE  No results found for: HGBA1C  Urinalysis No results found for: COLORURINE, APPEARANCEUR, LABSPEC, PHURINE, GLUCOSEU, HGBUR, BILIRUBINUR, KETONESUR, PROTEINUR, UROBILINOGEN, NITRITE, LEUKOCYTESUR  No results found for: LABMICR, [place], RBCUA, LABEPIT, MUCUS, BACTERIA  Pertinent Imaging:  No  results found for this or any previous visit.  No results found for this or any previous visit.  No results found for this or any previous visit.  No results found for this or any previous visit.  No results found for this or any previous visit.  No results found for this or any previous visit.  No results found for this or any previous visit.  No results found for this or any previous visit.   Assessment & Plan:    1. Nocturia -COntinue uroxatral 10mg  qhs - Urinalysis, Routine w reflex microscopic  2. Prostate cancer ([place]) -RTC 6 months with PSA  3. Benign prostatic hyperplasia with urinary obstruction -Continue uroxatral 10mg  qhs   No follow-ups  on file.  Nicolette Bang, MD  [facility] [place]

## 2021-01-09 NOTE — Patient Instructions (Signed)
Prostate Cancer °The prostate is a small gland that helps make semen. It is located below a man's bladder, in front of the rectum. Prostate cancer is when abnormal cells grow in this gland. °What are the causes? °The cause of this condition is not known. °What increases the risk? °Being age 77 or older. °Having a family history of prostate cancer. °Having a family history of cancer of the breasts or ovaries. °Having genes that are passed from parent to child (inherited). °Having Lynch syndrome. °African American men and men of African descent are diagnosed with prostate cancer at higher rates than other men. °What are the signs or symptoms? °Problems peeing (urinating). This may include: °A stream that is weak, or pee that stops and starts. °Trouble starting or stopping your pee. °Trouble emptying all of your pee. °Needing to pee more often, especially at night. °Blood in your pee or semen. °Pain in the: °Lower back. °Lower belly (abdomen). °Hips. °Trouble getting an erection. °Weakness or numbness in the legs or feet. °How is this treated? °Treatment for this condition depends on: °How much the cancer has spread. °Your age. °The kind of treatment you want. °Your health. °Treatments include: °Being watched. This is called observation. You will be tested from time to time, but you will not get treated. Tests are to make sure that the cancer is not growing. °Surgery. This may be done to: °Take out (remove) the prostate. °Freeze and kill cancer cells. °Radiation. This uses a strong beam of energy to kill cancer cells. °Chemotherapy. This uses medicines that stop cancer cells from increasing. This kills cancer cells and healthy cells. °Targeted therapy. This kills cancer cells only. Healthy cells are not affected. °Hormone treatment. This stops the body from making hormones that help the cancer cells grow. °Follow these instructions at home: °Lifestyle °Do not smoke or use any products that contain nicotine or tobacco.  If you need help quitting, ask your doctor. °Eat a healthy diet. °Treatment may affect your ability to have sex. If you have a partner, touch, hold, hug, and caress your partner to have intimate moments. °Get plenty of sleep. °Ask your doctor for help to find a support group for men with prostate cancer. °General instructions °Take over-the-counter and prescription medicines only as told by your doctor. °If you have to go to the hospital, let your cancer doctor (oncologist) know. °Keep all follow-up visits. °Where to find more information °American Cancer Society: www.cancer.org °American Society of Clinical Oncology: www.cancer.net °National Cancer Institute: www.cancer.gov °Contact a doctor if: °You have new or more trouble peeing. °You have new or more blood in your pee. °You have new or more pain in your hips, back, or chest. °Get help right away if: °You have weakness in your legs. °You lose feeling in your legs. °You cannot control your pee or your poop (stool). °You have chills or a fever. °Summary °The prostate is a male gland that helps make semen. °Prostate cancer is when abnormal cells grow in this gland. °Treatment includes doing surgery, using medicines, using strong beams of energy, or watching without treatment. °Ask your doctor for help to find a support group for men with prostate cancer. °Contact a doctor if you have problems peeing or have any new pain that you did not have before. °This information is not intended to replace advice given to you by your health care provider. Make sure you discuss any questions you have with your health care provider. °Document Revised: 04/04/2020 Document Reviewed: 04/04/2020 °Elsevier   Patient Education © 2022 [employer] ° °

## 2021-01-14 NOTE — Progress Notes (Signed)
Cardiology Office Note   Date:  01/16/2021   ID:  Bryan Hamilton, DOB 02-11-1945, MRN [id number]  PCP:  Celene Squibb, MD    No chief complaint on file.  AFib  Wt Readings from Last 3 Encounters:  12/17/20 220 lb (99.8 kg)  09/10/20 216 lb 3.2 oz (98.1 kg)  08/30/20 214 lb 12.8 oz (97.4 kg)       History of Present Illness: Bryan Hamilton is a 75 y.o. male  Who was previously seen by Dr. Bronson Ing for AFib.  No h/o CAD.  Records showed: "The patient presents for posthospitalization follow-up for rapid atrial fibrillation.  I consulted on him on 05/13/2019.   He is here with his daughter, Bryan Hamilton, who would also like to be contacted with any information regarding her father's health [phone]). She plans to move here from [facility] to help take care of her father. He has another daughter who lives in [place] with her family.  That daughter has atrial fibrillation and recently underwent cardioversion."   He has chronic SHOB from COPD.  He was started on ELiquis in 4/21.  Rate control with metoprolol 50 mg BID and Cardizem 240 mg CD.  HR was 108 on this regimen in May 2021 so Cardizem was increased to 300 mg daily.    Echo in 2021 April showed: "Left ventricular ejection fraction, by estimation, is 60 to 65%. The  left ventricle has normal function. Left ventricular endocardial border  not optimally defined to evaluate regional wall motion. There is mild left  ventricular hypertrophy. Left  ventricular diastolic parameters are indeterminate.   2. Right ventricular systolic function is normal. The right ventricular  size is mildly enlarged. There is moderately elevated pulmonary artery  systolic pressure.   3. Left atrial size was mildly dilated.   4. The mitral valve is normal in structure. No evidence of mitral valve  regurgitation. No evidence of mitral stenosis.   5. The aortic valve is tricuspid. Aortic valve regurgitation is not  visualized. No aortic  stenosis is present.   6. The inferior vena cava is normal in size with greater than 50%  respiratory variability, suggesting right atrial pressure of 3 mmHg. "   Plan in 5/22 was : "AFib: rate control.  Eliquis for stroke prevention. No palpitations or bleding issues.  HTN: The current medical regimen is effective;  continue present plan and medications.  Whole food, plant based diet recommended.  Chronic diastolic heart failure: Increase Lasix 40 mg BID prn.  I explained the scenario is when he would take an extra furosemide tablet.  He does weigh himself daily.  He will watch for worsening lower extremity edema or shortness of breath.  Elevate legs as well to help with edema.  Low-salt diet.  Avoid processed foods. Hyperlipidemia: The current medical regimen is effective;  continue present plan and medications. COPD: Continue inhalers."   Seen in the ER on August 23, 2020 with the following history: " c/o bilateral leg swelling R>L. States legs stay swollen, but is new to have right leg more swollen than left. Symptoms acute onset in past week, constant, moderate. Mild erythema to anterior right lower leg. No pain to leg. No numbness/weakness. No chest pain. States feels breathing at baseline, states always some sob and doe due to copd. Former smoker. Denies chest pain or discomfort or any pleuritic pain. No new or increased wob. ?orthopnea. No pnd. Use albuterol tx prn. No hx dvt or  pe, and indicates is compliant w eliquis therapy. "   No DVT by ultrasound.  Normal BNP as well.  Lasix increased for 3 days in Nov 2022.  Had blister on foot with some drainage nad was treated with antibiotics.   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    Dysrhythmia     afib    GERD (gastroesophageal reflux disease)    Hyperchloremia    Hypertension    PONV (postoperative nausea and vomiting)    Prostate cancer [facility])     Past Surgical History:  Procedure Laterality Date   BACK SURGERY     CYSTOSCOPY N/A 10/14/2019   Procedure: CYSTOSCOPY FLEXIBLE;  Surgeon: Cleon Gustin, MD;  Location: [facility];  Service: Urology;  Laterality: N/A;  NO SEEDS FOUND IN BLADDER   PROSTATE BIOPSY N/A 07/20/2019   Procedure: PROSTATE BIOPSY;  Surgeon: Cleon Gustin, MD;  Location: [facility];  Service: Urology;  Laterality: N/A;   RADIOACTIVE SEED IMPLANT N/A 10/14/2019   Procedure: RADIOACTIVE SEED IMPLANT/BRACHYTHERAPY IMPLANT;  Surgeon: Cleon Gustin, MD;  Location: [facility];  Service: Urology;  Laterality: N/A;    69  SEEDS IMPLANTED   SPACE OAR INSTILLATION N/A 10/14/2019   Procedure: SPACE OAR INSTILLATION;  Surgeon: Cleon Gustin, MD;  Location: [facility];  Service: Urology;  Laterality: N/A;     Current Outpatient Medications  Medication Sig Dispense Refill   acetaminophen (TYLENOL) 650 MG CR tablet Take 1,300 mg by mouth 2 (two) times daily as needed for pain.     alfuzosin (UROXATRAL) 10 MG 24 hr tablet Take 1 tablet (10 mg total) by mouth daily with breakfast. 90 tablet 3   ALPRAZolam (XANAX) 0.25 MG tablet Take 0.25 mg by mouth daily as needed for anxiety.      apixaban (ELIQUIS) 5 MG TABS tablet Take 1 tablet (5 mg total) by mouth 2 (two) times daily. 60 tablet 11   atorvastatin (LIPITOR) 40 MG tablet Take 40 mg by mouth daily.     diclofenac Sodium (VOLTAREN) 1 % GEL Apply 2 g topically daily as needed (Pain).     diltiazem (CARDIZEM CD) 180 MG 24 hr capsule Take 1 capsule (180 mg total) by mouth daily. 90 capsule 3   furosemide (LASIX) 40 MG tablet Take 1 tablet (40 mg total) by mouth 2 (two) times daily as needed. 180 tablet 1   metFORMIN (GLUCOPHAGE-XR) 500 MG 24 hr tablet Take 500 mg  by mouth daily.     metoprolol tartrate (LOPRESSOR) 50 MG tablet Take 50 mg by mouth 2 (two) times daily.     Omega-3 Fatty Acids (OMEGA 3 PO) Take 4 capsules by mouth daily. Last dose on 09/29/19     Polyethyl Glycol-Propyl Glycol (SYSTANE) 0.4-0.3 % SOLN Place 1-2 drops into both eyes daily as needed (Dry eye).      PROAIR HFA 108 (90 Base) MCG/ACT inhaler Inhale 2 puffs into the lungs every 6 (six) hours as needed for wheezing or shortness of breath.      sertraline (ZOLOFT) 25 MG tablet Take 25 mg by mouth at bedtime.  traMADol (ULTRAM) 50 MG tablet Take 1 tablet (50 mg total) by mouth every 6 (six) hours as needed for moderate pain. 12 tablet 0   TRELEGY ELLIPTA 100-62.5-25 MCG/INH AEPB Inhale 1 puff into the lungs daily.     atorvastatin (LIPITOR) 20 MG tablet Take 20 mg by mouth at bedtime.  (Patient not taking: Reported on 01/16/2021)     cephALEXin (KEFLEX) 500 MG capsule Take 1 capsule (500 mg total) by mouth 4 (four) times daily. (Patient not taking: Reported on 01/16/2021) 28 capsule 0   No current facility-administered medications for this visit.    Allergies:   Patient has no known allergies.    Social History:  The patient  reports that he quit smoking about 12 years ago. His smoking use included cigarettes. He has a 80.00 pack-year smoking history. He has never used smokeless tobacco. He reports that he does not currently use alcohol. He reports that he does not use drugs.   Family History:  The patient's family history includes Cancer in his father.    ROS:  Please see the history of present illness.   Otherwise, review of systems are positive for leg edema.   All other systems are reviewed and negative.    PHYSICAL EXAM: VS:  BP 116/70 (BP Location: Right Arm, Patient Position: Sitting, Cuff Size: Large)    Pulse 76    Ht 5\' 8"  (1.727 m)    SpO2 97% Comment: 4 L of oxygen   BMI 33.45 kg/m  , BMI Body mass index is 33.45 kg/m. GEN: Well nourished, well developed, in no  acute distress HEENT: normal Neck: no JVD, carotid bruits, or masses Cardiac: Irregularly irregular; no murmurs, rubs, or gallops, 2+ pitting lower extremity edema  Respiratory:  clear to auscultation bilaterally, normal work of breathing GI: soft, nontender, nondistended, + BS MS: no deformity or atrophy Skin: warm and dry, no rash; blister area healed well Neuro:  Strength and sensation are intact Psych: euthymic mood, full affect    Recent Labs: 08/23/2020: ALT 15; B Natriuretic Peptide 94.0; Hemoglobin 13.7; Platelets 197 09/10/2020: BUN 21; Creatinine, Ser 1.28; Potassium 4.2; Sodium 136   Lipid Panel No results found for: CHOL, TRIG, HDL, CHOLHDL, VLDL, LDLCALC, LDLDIRECT   Other studies Reviewed: Additional studies/ records that were reviewed today with results demonstrating: Labs reviewed, creatinine 1.1 in September 2022.   ASSESSMENT AND PLAN:  AFib: Rate controlled.  Continue current medications. HTN: Labs checked by Dr. Nevada Crane in [place].  Will get results.  Chronic diastolic heart failure: Appears euvolemic from a respiratory standpoint.  He still has lower extremity edema.  Increase Lasix to 40 to 80 mg p.o. twice daily as needed.  This should give him the flexibility of taking an additional tablet that will help with any weight gain.  Continue daily weights at home.  Elevate legs. Hyperlipidemia: LDL 80 in September 2022.  Continue atorvastatin. Anticoagulated: No bleeding problems.  Eliquis for stroke prevention.   Current medicines are reviewed at length with the patient today.  The patient concerns regarding his medicines were addressed.  The following changes have been made:    Labs/ tests ordered today include:  No orders of the defined types were placed in this encounter.   Recommend 150 minutes/week of aerobic exercise Low fat, low carb, high fiber diet recommended  Disposition:   FU in 9 months   Signed, Larae Grooms, MD  01/16/2021 11:51 AM     [facility]  [address] [place], [place]  [address] Phone: [phone]; Fax: [phone]

## 2021-01-16 ENCOUNTER — Other Ambulatory Visit: Payer: Self-pay

## 2021-01-16 ENCOUNTER — Ambulatory Visit: Payer: Medicare Other | Admitting: Interventional Cardiology

## 2021-01-16 ENCOUNTER — Encounter: Payer: Self-pay | Admitting: Interventional Cardiology

## 2021-01-16 VITALS — BP 116/70 | HR 76 | Ht 68.0 in

## 2021-01-16 DIAGNOSIS — I4891 Unspecified atrial fibrillation: Secondary | ICD-10-CM

## 2021-01-16 DIAGNOSIS — I10 Essential (primary) hypertension: Secondary | ICD-10-CM

## 2021-01-16 DIAGNOSIS — E782 Mixed hyperlipidemia: Secondary | ICD-10-CM

## 2021-01-16 DIAGNOSIS — M7989 Other specified soft tissue disorders: Secondary | ICD-10-CM | POA: Diagnosis not present

## 2021-01-16 DIAGNOSIS — I5032 Chronic diastolic (congestive) heart failure: Secondary | ICD-10-CM | POA: Diagnosis not present

## 2021-01-16 MED ORDER — FUROSEMIDE 40 MG PO TABS: ORAL_TABLET | ORAL | 1 refills | Status: DC

## 2021-01-16 NOTE — Patient Instructions (Signed)
Medication Instructions:  Your physician recommends that you continue on your current medications as directed. Please refer to the Current Medication list given to you today.  *If you need a refill on your cardiac medications before your next appointment, please call your pharmacy*   Lab Work: None If you have labs (blood work) drawn today and your tests are completely normal, you will receive your results only by: [place] (if you have MyChart) OR A paper copy in the mail If you have any lab test that is abnormal or we need to change your treatment, we will call you to review the results.   Testing/Procedures: None   Follow-Up: At [facility], you and your health needs are our priority.  As part of our continuing mission to provide you with exceptional heart care, we have created designated Provider Care Teams.  These Care Teams include your primary Cardiologist (physician) and Advanced Practice Providers (APPs -  Physician Assistants and Nurse Practitioners) who all work together to provide you with the care you need, when you need it.  We recommend signing up for the patient portal called "MyChart".  Sign up information is provided on this After Visit Summary.  MyChart is used to connect with patients for Virtual Visits (Telemedicine).  Patients are able to view lab/test results, encounter notes, upcoming appointments, etc.  Non-urgent messages can be sent to your provider as well.   To learn more about what you can do with MyChart, go to [email or web address].    Your next appointment:   9 month(s)  The format for your next appointment:   In Person  Provider:   Larae Grooms, MD     Other Instructions

## 2021-01-18 DIAGNOSIS — I10 Essential (primary) hypertension: Secondary | ICD-10-CM | POA: Diagnosis not present

## 2021-01-18 DIAGNOSIS — E782 Mixed hyperlipidemia: Secondary | ICD-10-CM | POA: Diagnosis not present

## 2021-02-04 ENCOUNTER — Encounter (HOSPITAL_COMMUNITY): Payer: Self-pay

## 2021-02-04 ENCOUNTER — Inpatient Hospital Stay (HOSPITAL_COMMUNITY)
Admission: EM | Admit: 2021-02-04 | Discharge: 2021-02-07 | DRG: 309 | Disposition: A | Discharge status: Home / Self Care | Payer: Medicare Other | Attending: Internal Medicine | Admitting: Internal Medicine

## 2021-02-04 ENCOUNTER — Other Ambulatory Visit: Payer: Self-pay

## 2021-02-04 ENCOUNTER — Emergency Department (HOSPITAL_COMMUNITY): Payer: Medicare Other

## 2021-02-04 DIAGNOSIS — E861 Hypovolemia: Secondary | ICD-10-CM | POA: Diagnosis present

## 2021-02-04 DIAGNOSIS — K5900 Constipation, unspecified: Secondary | ICD-10-CM | POA: Diagnosis not present

## 2021-02-04 DIAGNOSIS — E782 Mixed hyperlipidemia: Secondary | ICD-10-CM | POA: Diagnosis not present

## 2021-02-04 DIAGNOSIS — I110 Hypertensive heart disease with heart failure: Secondary | ICD-10-CM | POA: Diagnosis present

## 2021-02-04 DIAGNOSIS — I248 Other forms of acute ischemic heart disease: Secondary | ICD-10-CM | POA: Diagnosis present

## 2021-02-04 DIAGNOSIS — Z20822 Contact with and (suspected) exposure to covid-19: Secondary | ICD-10-CM | POA: Diagnosis not present

## 2021-02-04 DIAGNOSIS — Z743 Need for continuous supervision: Secondary | ICD-10-CM | POA: Diagnosis not present

## 2021-02-04 DIAGNOSIS — E871 Hypo-osmolality and hyponatremia: Secondary | ICD-10-CM | POA: Diagnosis not present

## 2021-02-04 DIAGNOSIS — R531 Weakness: Secondary | ICD-10-CM | POA: Diagnosis not present

## 2021-02-04 DIAGNOSIS — Z91138 Patient's unintentional underdosing of medication regimen for other reason: Secondary | ICD-10-CM

## 2021-02-04 DIAGNOSIS — F419 Anxiety disorder, unspecified: Secondary | ICD-10-CM | POA: Diagnosis present

## 2021-02-04 DIAGNOSIS — E876 Hypokalemia: Secondary | ICD-10-CM | POA: Diagnosis not present

## 2021-02-04 DIAGNOSIS — J439 Emphysema, unspecified: Secondary | ICD-10-CM | POA: Diagnosis not present

## 2021-02-04 DIAGNOSIS — G8929 Other chronic pain: Secondary | ICD-10-CM | POA: Diagnosis not present

## 2021-02-04 DIAGNOSIS — K219 Gastro-esophageal reflux disease without esophagitis: Secondary | ICD-10-CM | POA: Diagnosis not present

## 2021-02-04 DIAGNOSIS — Z8546 Personal history of malignant neoplasm of prostate: Secondary | ICD-10-CM

## 2021-02-04 DIAGNOSIS — I5032 Chronic diastolic (congestive) heart failure: Secondary | ICD-10-CM | POA: Diagnosis not present

## 2021-02-04 DIAGNOSIS — R0689 Other abnormalities of breathing: Secondary | ICD-10-CM | POA: Diagnosis not present

## 2021-02-04 DIAGNOSIS — N179 Acute kidney failure, unspecified: Secondary | ICD-10-CM | POA: Diagnosis not present

## 2021-02-04 DIAGNOSIS — R1110 Vomiting, unspecified: Secondary | ICD-10-CM | POA: Diagnosis not present

## 2021-02-04 DIAGNOSIS — I4891 Unspecified atrial fibrillation: Secondary | ICD-10-CM | POA: Diagnosis not present

## 2021-02-04 DIAGNOSIS — R197 Diarrhea, unspecified: Secondary | ICD-10-CM

## 2021-02-04 DIAGNOSIS — J9611 Chronic respiratory failure with hypoxia: Secondary | ICD-10-CM | POA: Diagnosis present

## 2021-02-04 DIAGNOSIS — Z7984 Long term (current) use of oral hypoglycemic drugs: Secondary | ICD-10-CM

## 2021-02-04 DIAGNOSIS — R000 Tachycardia, unspecified: Secondary | ICD-10-CM | POA: Diagnosis not present

## 2021-02-04 DIAGNOSIS — M1990 Unspecified osteoarthritis, unspecified site: Secondary | ICD-10-CM | POA: Diagnosis not present

## 2021-02-04 DIAGNOSIS — R778 Other specified abnormalities of plasma proteins: Secondary | ICD-10-CM | POA: Diagnosis not present

## 2021-02-04 DIAGNOSIS — Z9981 Dependence on supplemental oxygen: Secondary | ICD-10-CM | POA: Diagnosis not present

## 2021-02-04 DIAGNOSIS — I4821 Permanent atrial fibrillation: Principal | ICD-10-CM | POA: Diagnosis present

## 2021-02-04 DIAGNOSIS — Z87891 Personal history of nicotine dependence: Secondary | ICD-10-CM

## 2021-02-04 DIAGNOSIS — T447X6A Underdosing of beta-adrenoreceptor antagonists, initial encounter: Secondary | ICD-10-CM | POA: Diagnosis present

## 2021-02-04 DIAGNOSIS — Z7901 Long term (current) use of anticoagulants: Secondary | ICD-10-CM

## 2021-02-04 DIAGNOSIS — I480 Paroxysmal atrial fibrillation: Secondary | ICD-10-CM

## 2021-02-04 DIAGNOSIS — T461X6A Underdosing of calcium-channel blockers, initial encounter: Secondary | ICD-10-CM | POA: Diagnosis present

## 2021-02-04 DIAGNOSIS — E1165 Type 2 diabetes mellitus with hyperglycemia: Secondary | ICD-10-CM | POA: Diagnosis present

## 2021-02-04 DIAGNOSIS — J449 Chronic obstructive pulmonary disease, unspecified: Secondary | ICD-10-CM | POA: Diagnosis not present

## 2021-02-04 DIAGNOSIS — M549 Dorsalgia, unspecified: Secondary | ICD-10-CM | POA: Diagnosis present

## 2021-02-04 DIAGNOSIS — Z79899 Other long term (current) drug therapy: Secondary | ICD-10-CM

## 2021-02-04 DIAGNOSIS — F32A Depression, unspecified: Secondary | ICD-10-CM | POA: Diagnosis not present

## 2021-02-04 DIAGNOSIS — I10 Essential (primary) hypertension: Secondary | ICD-10-CM | POA: Diagnosis not present

## 2021-02-04 DIAGNOSIS — R609 Edema, unspecified: Secondary | ICD-10-CM | POA: Diagnosis not present

## 2021-02-04 DIAGNOSIS — I499 Cardiac arrhythmia, unspecified: Secondary | ICD-10-CM | POA: Diagnosis not present

## 2021-02-04 LAB — RESP PANEL BY RT-PCR (FLU A&B, COVID) ARPGX2
Influenza A by PCR: NEGATIVE
Influenza B by PCR: NEGATIVE
SARS Coronavirus 2 by RT PCR: NEGATIVE

## 2021-02-04 LAB — BASIC METABOLIC PANEL
Anion gap: 12 (ref 5–15)
BUN: 44 mg/dL — ABNORMAL HIGH (ref 8–23)
CO2: 27 mmol/L (ref 22–32)
Calcium: 8.7 mg/dL — ABNORMAL LOW (ref 8.9–10.3)
Chloride: 92 mmol/L — ABNORMAL LOW (ref 98–111)
Creatinine, Ser: 1.74 mg/dL — ABNORMAL HIGH (ref 0.61–1.24)
GFR, Estimated: 40 mL/min — ABNORMAL LOW (ref >60)
Glucose, Bld: 186 mg/dL — ABNORMAL HIGH (ref 70–99)
Potassium: 3.4 mmol/L — ABNORMAL LOW (ref 3.5–5.1)
Sodium: 131 mmol/L — ABNORMAL LOW (ref 135–145)

## 2021-02-04 LAB — CBC WITH DIFFERENTIAL/PLATELET
Abs Immature Granulocytes: 0.07 10*3/uL (ref 0.00–0.07)
Basophils Absolute: 0 10*3/uL (ref 0.0–0.1)
Basophils Relative: 1 %
Eosinophils Absolute: 0 10*3/uL (ref 0.0–0.5)
Eosinophils Relative: 0 %
HCT: 45.2 % (ref 39.0–52.0)
Hemoglobin: 14.7 g/dL (ref 13.0–17.0)
Immature Granulocytes: 1 %
Lymphocytes Relative: 3 %
Lymphs Abs: 0.2 10*3/uL — ABNORMAL LOW (ref 0.7–4.0)
MCH: 30.4 pg (ref 26.0–34.0)
MCHC: 32.5 g/dL (ref 30.0–36.0)
MCV: 93.4 fL (ref 80.0–100.0)
Monocytes Absolute: 0.6 10*3/uL (ref 0.1–1.0)
Monocytes Relative: 9 %
Neutro Abs: 5.7 10*3/uL (ref 1.7–7.7)
Neutrophils Relative %: 86 %
Platelets: 226 10*3/uL (ref 150–400)
RBC: 4.84 MIL/uL (ref 4.22–5.81)
RDW: 16.1 % — ABNORMAL HIGH (ref 11.5–15.5)
WBC: 6.6 10*3/uL (ref 4.0–10.5)
nRBC: 0.3 % — ABNORMAL HIGH (ref 0.0–0.2)

## 2021-02-04 LAB — BRAIN NATRIURETIC PEPTIDE: B Natriuretic Peptide: 55 pg/mL (ref 0.0–100.0)

## 2021-02-04 LAB — TROPONIN I (HIGH SENSITIVITY)
Troponin I (High Sensitivity): 49 ng/L — ABNORMAL HIGH (ref <18)
Troponin I (High Sensitivity): 59 ng/L — ABNORMAL HIGH (ref <18)

## 2021-02-04 LAB — TSH: TSH: 2.951 u[IU]/mL (ref 0.350–4.500)

## 2021-02-04 LAB — MAGNESIUM: Magnesium: 1.4 mg/dL — ABNORMAL LOW (ref 1.7–2.4)

## 2021-02-04 MED ORDER — APIXABAN 5 MG PO TABS
5.0000 mg | ORAL_TABLET | Freq: Two times a day (BID) | ORAL | Status: DC
  Administered 2021-02-04 – 2021-02-07 (×6): 5 mg via ORAL
  Filled 2021-02-04 (×7): qty 1

## 2021-02-04 MED ORDER — UMECLIDINIUM BROMIDE 62.5 MCG/ACT IN AEPB
1.0000 | INHALATION_SPRAY | Freq: Every day | RESPIRATORY_TRACT | Status: DC
  Administered 2021-02-05 – 2021-02-07 (×3): 1 via RESPIRATORY_TRACT
  Filled 2021-02-04 (×2): qty 7

## 2021-02-04 MED ORDER — LACTATED RINGERS IV BOLUS
1000.0000 mL | Freq: Once | INTRAVENOUS | Status: AC
  Administered 2021-02-04: 1000 mL via INTRAVENOUS

## 2021-02-04 MED ORDER — FLUTICASONE-UMECLIDIN-VILANT 100-62.5-25 MCG/ACT IN AEPB: 1.0000 | INHALATION_SPRAY | Freq: Every day | RESPIRATORY_TRACT | Status: DC

## 2021-02-04 MED ORDER — ALPRAZOLAM 0.5 MG PO TABS
0.2500 mg | ORAL_TABLET | Freq: Once | ORAL | Status: AC
  Administered 2021-02-04: 0.25 mg via ORAL
  Filled 2021-02-04: qty 1

## 2021-02-04 MED ORDER — DILTIAZEM HCL-DEXTROSE 125-5 MG/125ML-% IV SOLN (PREMIX)
5.0000 mg/h | INTRAVENOUS | Status: DC
  Administered 2021-02-04: 20:00:00 5 mg/h via INTRAVENOUS
  Filled 2021-02-04: qty 125

## 2021-02-04 MED ORDER — FLUTICASONE FUROATE-VILANTEROL 100-25 MCG/ACT IN AEPB
1.0000 | INHALATION_SPRAY | Freq: Every day | RESPIRATORY_TRACT | Status: DC
  Administered 2021-02-05 – 2021-02-07 (×3): 1 via RESPIRATORY_TRACT
  Filled 2021-02-04 (×2): qty 28

## 2021-02-04 MED ORDER — DILTIAZEM HCL 25 MG/5ML IV SOLN
10.0000 mg | Freq: Once | INTRAVENOUS | Status: AC
Start: 2021-02-04 — End: 2021-02-04
  Administered 2021-02-04: 10 mg via INTRAVENOUS
  Filled 2021-02-04: qty 5

## 2021-02-04 MED ORDER — MAGNESIUM SULFATE 2 GM/50ML IV SOLN
2.0000 g | Freq: Once | INTRAVENOUS | Status: AC
Start: 2021-02-04 — End: 2021-02-04
  Administered 2021-02-04: 2 g via INTRAVENOUS
  Filled 2021-02-04: qty 50

## 2021-02-04 MED ORDER — POTASSIUM CHLORIDE 10 MEQ/100ML IV SOLN
10.0000 meq | INTRAVENOUS | Status: AC
  Administered 2021-02-04 (×3): 10 meq via INTRAVENOUS
  Filled 2021-02-04 (×3): qty 100

## 2021-02-04 NOTE — ED Triage Notes (Signed)
Pt arrives via [facility] from home called out for sick call. Upon EMS arrival, pt noted to be in Afib RVR with rate at 190. 20mg  of cardizem given. A/o x4

## 2021-02-04 NOTE — ED Notes (Signed)
Notified EDP of vs.

## 2021-02-04 NOTE — H&P (Signed)
History and Physical  Bryan Hamilton YPP:[id number] DOB: 05/15/1945 DOA: 02/04/2021  Referring physician: Godfrey Pick, MD PCP: Celene Squibb, MD  Patient coming from: Home  Chief Complaint: Generalized weakness  HPI: Bryan Hamilton is a 76 y.o. male with medical history significant for COPD on 4 LPM of oxygen at baseline, hypertension, hyperlipidemia, chronic back pain, atrial fibrillation on Eliquis, anxiety, T2DM, depression who presents to the emergency department due to generalized weakness.  Patient endorsed having a large watery stool yesterday and also states that he had about 2 episodes of nonbloody vomiting, he presents with generalized weakness today, EMS was activated and patient was noted to be in A. fib with RVR in the range of 180s, IV Cardizem 20 mg x 1 was given with HR dropping to 130s, but BP dropped to 90s/60s.  He was taken to the ED for further evaluation and management.  ED Course:  In the emergency department, he was tachypneic, tachycardic, O2 sat ranged within 92-97% on supplemental oxygen at 4 LPM.  Work-up in the ED showed normal CBC, BMP showed hypokalemia, hyperglycemia, BUN/creatinine 44/1.74 (baseline creatinine at 1.1-1.3), troponin x2 -49 > 59, BNP was normal, magnesium 1.4.  Influenza A, B, SARS coronavirus 2 was negative. Chest x-ray showed emphysema, chronic central vascular congestion without overt edema. Patient was started on IV Cardizem drip, IV hydration was provided, magnesium was replenished, potassium was replenished.  Patient's BP dropped to 88/61 while on Cardizem drip, this was held and patient was started on IV amiodarone drip.  Review of Systems: A full 10 point Review of Systems was done, except as stated above, all other Review of systems were negative.  Past Medical History:  Diagnosis Date   Anxiety    Arrhythmia    Arthritis    Asthma    Atrial fibrillation (HCC)    CHF (congestive heart failure) (HCC)    COPD (chronic obstructive  pulmonary disease) (HCC)    Depression    Diabetes mellitus without complication (HCC)    tpe 2    Dysrhythmia    afib    GERD (gastroesophageal reflux disease)    Hyperchloremia    Hypertension    PONV (postoperative nausea and vomiting)    Prostate cancer [facility])    Past Surgical History:  Procedure Laterality Date   BACK SURGERY     CYSTOSCOPY N/A 10/14/2019   Procedure: CYSTOSCOPY FLEXIBLE;  Surgeon: Cleon Gustin, MD;  Location: [facility];  Service: Urology;  Laterality: N/A;  NO SEEDS FOUND IN BLADDER   PROSTATE BIOPSY N/A 07/20/2019   Procedure: PROSTATE BIOPSY;  Surgeon: Cleon Gustin, MD;  Location: [facility];  Service: Urology;  Laterality: N/A;   RADIOACTIVE SEED IMPLANT N/A 10/14/2019   Procedure: RADIOACTIVE SEED IMPLANT/BRACHYTHERAPY IMPLANT;  Surgeon: Cleon Gustin, MD;  Location: [facility];  Service: Urology;  Laterality: N/A;    69  SEEDS IMPLANTED   SPACE OAR INSTILLATION N/A 10/14/2019   Procedure: SPACE OAR INSTILLATION;  Surgeon: Cleon Gustin, MD;  Location[facility];  Service: Urology;  Laterality: N/A;    Social History:  reports that he quit smoking about 13 years ago. His smoking use included cigarettes. He has a 80.00 pack-year smoking history. He has never used smokeless tobacco. He reports that he does not currently use alcohol. He reports that he does not use drugs.   No Known Allergies  Family History  Problem Relation Age of Onset  Cancer Father        type unknown   Breast cancer Neg Hx    Prostate cancer Neg Hx    Colon cancer Neg Hx    Pancreatic cancer Neg Hx      Prior to Admission medications   Medication Sig Start Date End Date Taking? Authorizing Provider  acetaminophen (TYLENOL) 650 MG CR tablet Take 1,300 mg by mouth 2 (two) times daily as needed for pain.    [provider]  alfuzosin (UROXATRAL) 10 MG 24 hr tablet Take 1 tablet (10 mg total) by mouth  daily with breakfast. 01/09/21   McKenzie, Candee Furbish, MD  ALPRAZolam Duanne Moron) 0.25 MG tablet Take 0.25 mg by mouth daily as needed for anxiety.  07/12/19   [provider]  apixaban (ELIQUIS) 5 MG TABS tablet Take 1 tablet (5 mg total) by mouth 2 (two) times daily. 07/18/19   Herminio Commons, MD  atorvastatin (LIPITOR) 20 MG tablet Take 20 mg by mouth at bedtime.  Patient not taking: Reported on 01/16/2021 03/07/19   [provider]  atorvastatin (LIPITOR) 40 MG tablet Take 40 mg by mouth daily. 01/02/21   [provider]  cephALEXin (KEFLEX) 500 MG capsule Take 1 capsule (500 mg total) by mouth 4 (four) times daily. Patient not taking: Reported on 01/16/2021 08/23/20   Lajean Saver, MD  diclofenac Sodium (VOLTAREN) 1 % GEL Apply 2 g topically daily as needed (Pain).    [provider]  diltiazem (CARDIZEM CD) 180 MG 24 hr capsule Take 1 capsule (180 mg total) by mouth daily. 09/10/20   Jettie Booze, MD  furosemide (LASIX) 40 MG tablet Take 1-2 tablets by mouth twice daily as needed for swelling 01/16/21   Jettie Booze, MD  metFORMIN (GLUCOPHAGE-XR) 500 MG 24 hr tablet Take 500 mg by mouth daily. 03/14/20   [provider]  metoprolol tartrate (LOPRESSOR) 50 MG tablet Take 50 mg by mouth 2 (two) times daily. 05/16/19   [provider]  Omega-3 Fatty Acids (OMEGA 3 PO) Take 4 capsules by mouth daily. Last dose on 09/29/19    [provider]  Polyethyl Glycol-Propyl Glycol (SYSTANE) 0.4-0.3 % SOLN Place 1-2 drops into both eyes daily as needed (Dry eye).     [provider]  PROAIR HFA 108 [phone] Base) MCG/ACT inhaler Inhale 2 puffs into the lungs every 6 (six) hours as needed for wheezing or shortness of breath.  03/31/19   [provider]  sertraline (ZOLOFT) 25 MG tablet Take 25 mg by mouth at bedtime.  07/12/19   [provider]  traMADol (ULTRAM) 50 MG tablet Take 1 tablet (50 mg total) by mouth every  6 (six) hours as needed for moderate pain. 05/14/19   Orson Eva, MD  TRELEGY ELLIPTA 100-62.5-25 MCG/INH AEPB Inhale 1 puff into the lungs daily. 03/31/19   [provider]    Physical Exam: BP 119/69 (BP Location: Right Arm)    Pulse (!) 116    Temp 97.8 F (36.6 C) (Oral)    Resp 20    Ht 5\' 8"  (1.727 m)    Wt 100.2 kg    SpO2 92%    BMI 33.59 kg/m   General: 76 y.o. year-old male well developed well nourished in no acute distress.  Alert and oriented x3. HEENT: NCAT, EOMI Neck: Supple, trachea medial Cardiovascular: Tachycardia.  Irregular rate and rhythm with no rubs or gallops.  No thyromegaly or JVD noted. 2/4 pulses  in all 4 extremities. Respiratory: Tachypnea.  Clear to auscultation with no wheezes or rales. Good inspiratory effort. Abdomen: Soft, obese, nontender nondistended with normal bowel sounds x4 quadrants. Muskuloskeletal: Bilateral trace edema in lower extremities.  No cyanosis or clubbing noted bilaterally Neuro: CN II-XII intact, strength 5/5 x 4, sensation, reflexes intact Skin: No ulcerative lesions noted or rashes Psychiatry: Mood is appropriate for condition and setting          Labs on Admission:  Basic Metabolic Panel: Recent Labs  Lab 02/04/21 2016  NA 131*  K 3.4*  CL 92*  CO2 27  GLUCOSE 186*  BUN 44*  CREATININE 1.74*  CALCIUM 8.7*  MG 1.4*   Liver Function Tests: No results for input(s): AST, ALT, ALKPHOS, BILITOT, PROT, ALBUMIN in the last 168 hours. No results for input(s): LIPASE, AMYLASE in the last 168 hours. No results for input(s): AMMONIA in the last 168 hours. CBC: Recent Labs  Lab 02/04/21 2016  WBC 6.6  NEUTROABS 5.7  HGB 14.7  HCT 45.2  MCV 93.4  PLT 226   Cardiac Enzymes: No results for input(s): CKTOTAL, CKMB, CKMBINDEX, TROPONINI in the last 168 hours.  BNP (last 3 results) Recent Labs    08/23/20 1105 02/04/21 2016  BNP 94.0 55.0    ProBNP (last 3 results) No results for input(s): PROBNP in the last  8760 hours.  CBG: No results for input(s): GLUCAP in the last 168 hours.  Radiological Exams on Admission: DG Chest Port 1 View  Result Date: 02/04/2021 CLINICAL DATA:  Atrial fibrillation with rapid ventricular response, tachycardia, weakness EXAM: PORTABLE CHEST 1 VIEW COMPARISON:  08/23/2020 FINDINGS: Single frontal view of the chest demonstrates a stable cardiac silhouette. Chronic central vascular congestion. Background emphysema without airspace disease, effusion, or pneumothorax. No acute bony abnormalities. IMPRESSION: 1. Emphysema. 2. Chronic central vascular congestion without overt edema. Electronically Signed   By: Randa Ngo M.D.   On: 02/04/2021 20:58    EKG: I independently viewed the EKG done and my findings are as followed: A. fib with RVR with LAFB  Assessment/Plan Present on Admission:  Paroxysmal atrial fibrillation with RVR ([place])  Principal Problem:   Paroxysmal atrial fibrillation with RVR (HCC) Active Problems:   Elevated troponin   Hypomagnesemia   Hypokalemia   Hyponatremia   Hyperglycemia due to diabetes mellitus (HCC)   AKI (acute kidney injury) ([place])   Essential hypertension   Mixed hyperlipidemia   COPD (chronic obstructive pulmonary disease) (HCC)   Vomiting and diarrhea   Generalized weakness   Paroxysmal atrial fibrillation with RVR Patient was started on IV Cardizem drip, this was transitioned to IV amiodarone drip due to hypotension Continue home Eliquis Consider transitioning to home oral meds when the HR is better controlled  Generalized weakness, vomiting, diarrhea Patient endorsed to only 1 time large watery stool yesterday and about 2 episodes of nonbloody vomiting Patient has not had any diarrhea movement or vomiting since arrival to the ED Continue Zofran as needed Protein supplement to be provided Continue fall precaution and neurochecks  Hypokalemia K+ is 3.4 K+ will be replenished Please monitor for AM K+ for further  replenishmemnt  Hypomagnesemia Mg level is 1.4 This will be replenished Please continue to monitor Mg level and correct accordingly  Hyponatremia Na 130, IV hydration was provided  Elevated troponin possibly secondary to type II demand ischemia Troponin x2 -49 > 59; he denies any chest pain, continue to trend troponin  Hyperglycemia secondary to T2DM Continue ISS  and hypoglycemic protocol Metformin will be held at this time  Acute kidney injury BUN/creatinine 44/1.74 (baseline creatinine at 1.1-1.3) Renally adjust medications, avoid nephrotoxic agents/dehydration/hypotension  COPD Continue ProAir  Essential hypertension BP meds will be held at this time due to soft BP  Mixed hyperlipidemia Continue Lipitor  Depression Continue Zoloft  DVT prophylaxis: Eliquis  Code Status: Full code  Family Communication: None at the bedside  Disposition Plan:  Patient is from:                        home Anticipated DC to:                   SNF or family members home Anticipated DC date:               2-3 days Anticipated DC barriers:          Patient requires inpatient management due to A. fib with RVR requiring IV rate control drip    Consults called: None  Admission status: Inpatient    Bernadette Hoit MD [facility]  02/05/2021, 2:51 AM

## 2021-02-04 NOTE — ED Provider Notes (Signed)
[facility] EMERGENCY DEPARTMENT Provider Note   CSN: [id number] Arrival date & time: 02/04/21  1939     History  Chief Complaint  Patient presents with   Atrial Fibrillation    Bryan Hamilton is a 76 y.o. male.   Atrial Fibrillation Pertinent negatives include no chest pain, no abdominal pain and no shortness of breath. Patient presents via EMS for generalized weakness.  He reports that he has had vomiting diarrhea for the past 2 days.  On EMS arrival, he was found to be in atrial fibrillation with RVR, heart rate in the range of 180 during transit.  He was given 20 mg of diltiazem.  Heart rate improved to the range of 130.  Blood pressure also became soft in the range of 90s over 60s.  Patient has history of COPD and is on 4 L of supplemental oxygen at baseline.  Additional medical history is notable for CHF, DM, HTN, and prostate cancer.  Per chart review, he does have a history of atrial fibrillation.  He was last seen by cardiology Conception Oms) 3 weeks ago.  Regimen for rate control has been 50 mg Toprol twice daily, and 300 mg Cardizem daily.  He is on Eliquis.    Home Medications Prior to Admission medications   Medication Sig Start Date End Date Taking? Authorizing Provider  acetaminophen (TYLENOL) 650 MG CR tablet Take 1,300 mg by mouth 2 (two) times daily as needed for pain.   Yes [provider]  alfuzosin (UROXATRAL) 10 MG 24 hr tablet Take 1 tablet (10 mg total) by mouth daily with breakfast. 01/09/21  Yes McKenzie, Candee Furbish, MD  ALPRAZolam Duanne Moron) 0.25 MG tablet Take 0.25 mg by mouth daily as needed for anxiety.  07/12/19  Yes [provider]  apixaban (ELIQUIS) 5 MG TABS tablet Take 1 tablet (5 mg total) by mouth 2 (two) times daily. 07/18/19  Yes Herminio Commons, MD  atorvastatin (LIPITOR) 40 MG tablet Take 40 mg by mouth daily. 01/02/21  Yes [provider]  diclofenac Sodium (VOLTAREN) 1 % GEL Apply 2 g topically daily as needed (Pain).    Yes [provider]  diltiazem (CARDIZEM CD) 180 MG 24 hr capsule Take 1 capsule (180 mg total) by mouth daily. 09/10/20  Yes Jettie Booze, MD  furosemide (LASIX) 40 MG tablet Take 1-2 tablets by mouth twice daily as needed for swelling 01/16/21  Yes Jettie Booze, MD  metFORMIN (GLUCOPHAGE-XR) 500 MG 24 hr tablet Take 500 mg by mouth daily. 03/14/20  Yes [provider]  metoprolol tartrate (LOPRESSOR) 50 MG tablet Take 50 mg by mouth 2 (two) times daily. 05/16/19  Yes [provider]  Omega-3 Fatty Acids (OMEGA 3 PO) Take 4 capsules by mouth daily. Last dose on 09/29/19   Yes [provider]  Polyethyl Glycol-Propyl Glycol (SYSTANE) 0.4-0.3 % SOLN Place 1-2 drops into both eyes daily as needed (Dry eye).    Yes [provider]  PROAIR HFA 108 (90 Base) MCG/ACT inhaler Inhale 2 puffs into the lungs every 6 (six) hours as needed for wheezing or shortness of breath.  03/31/19  Yes [provider]  sertraline (ZOLOFT) 25 MG tablet Take 25 mg by mouth at bedtime.  07/12/19  Yes [provider]  traMADol (ULTRAM) 50 MG tablet Take 1 tablet (50 mg total) by mouth every 6 (six) hours as needed for moderate pain. 05/14/19  Yes TatShanon Brow, MD  TRELEGY ELLIPTA 100-62.5-25 MCG/INH AEPB Inhale  1 puff into the lungs daily. 03/31/19  Yes [provider]  atorvastatin (LIPITOR) 20 MG tablet Take 20 mg by mouth at bedtime.  Patient not taking: Reported on 02/05/2021 03/07/19   [provider]  cephALEXin (KEFLEX) 500 MG capsule Take 1 capsule (500 mg total) by mouth 4 (four) times daily. Patient not taking: Reported on 01/16/2021 08/23/20   Lajean Saver, MD      Allergies    Patient has no known allergies.    Review of Systems   Review of Systems  Constitutional:  Positive for activity change, appetite change and fatigue. Negative for chills, diaphoresis and fever.  HENT:  Negative for ear pain and sore throat.   Eyes:   Negative for pain and visual disturbance.  Respiratory:  Negative for cough, chest tightness and shortness of breath.   Cardiovascular:  Negative for chest pain and palpitations.  Gastrointestinal:  Positive for diarrhea, nausea and vomiting. Negative for abdominal pain.  Genitourinary:  Negative for dysuria, flank pain and hematuria.  Musculoskeletal:  Negative for arthralgias, back pain, myalgias and neck pain.  Skin:  Negative for color change and rash.  Neurological:  Positive for weakness (Generalized). Negative for dizziness, seizures, syncope, speech difficulty, light-headedness and numbness.  Hematological:  Bruises/bleeds easily (On Eliquis).  Psychiatric/Behavioral:  Negative for confusion and decreased concentration.   All other systems reviewed and are negative.  Physical Exam Updated Vital Signs BP 124/70 (BP Location: Left Arm)    Pulse (!) 105    Temp 98 F (36.7 C) (Oral)    Resp 18    Ht 5\' 8"  (1.727 m)    Wt 101.3 kg    SpO2 97%    BMI 33.96 kg/m  Physical Exam Vitals and nursing note reviewed.  Constitutional:      General: He is awake. He is not in acute distress.    Appearance: He is well-developed. He is ill-appearing. He is not toxic-appearing or diaphoretic.     Interventions: Nasal cannula in place.  HENT:     Head: Normocephalic and atraumatic.     Right Ear: External ear normal.     Left Ear: External ear normal.     Nose: Nose normal.     Mouth/Throat:     Mouth: Mucous membranes are moist.     Pharynx: Oropharynx is clear.  Eyes:     General: No scleral icterus.    Extraocular Movements: Extraocular movements intact.     Conjunctiva/sclera: Conjunctivae normal.  Cardiovascular:     Rate and Rhythm: Tachycardia present. Rhythm irregular.     Heart sounds: No murmur heard. Pulmonary:     Effort: Pulmonary effort is normal. No respiratory distress.     Breath sounds: Wheezing present. No rales.  Chest:     Chest wall: No tenderness.  Abdominal:      Palpations: Abdomen is soft.     Tenderness: There is no abdominal tenderness.  Musculoskeletal:        General: No swelling, tenderness or deformity.     Cervical back: Normal range of motion and neck supple. No rigidity.  Skin:    General: Skin is warm and dry.     Coloration: Skin is not jaundiced or pale.  Neurological:     General: No focal deficit present.     Mental Status: He is alert and oriented to person, place, and time.     Cranial Nerves: No cranial nerve deficit.     Sensory: No sensory  deficit.     Motor: No weakness.     Coordination: Coordination normal.  Psychiatric:        Mood and Affect: Mood normal.        Behavior: Behavior normal.    ED Results / Procedures / Treatments   Labs (all labs ordered are listed, but only abnormal results are displayed) Labs Reviewed  BASIC METABOLIC PANEL - Abnormal; Notable for the following components:      Result Value   Sodium 131 (*)    Potassium 3.4 (*)    Chloride 92 (*)    Glucose, Bld 186 (*)    BUN 44 (*)    Creatinine, Ser 1.74 (*)    Calcium 8.7 (*)    GFR, Estimated 40 (*)    All other components within normal limits  MAGNESIUM - Abnormal; Notable for the following components:   Magnesium 1.4 (*)    All other components within normal limits  CBC WITH DIFFERENTIAL/PLATELET - Abnormal; Notable for the following components:   RDW 16.1 (*)    nRBC 0.3 (*)    Lymphs Abs 0.2 (*)    All other components within normal limits  COMPREHENSIVE METABOLIC PANEL - Abnormal; Notable for the following components:   Sodium 134 (*)    Chloride 94 (*)    Glucose, Bld 143 (*)    BUN 43 (*)    Creatinine, Ser 1.40 (*)    Calcium 8.4 (*)    GFR, Estimated 51 (*)    All other components within normal limits  CBC - Abnormal; Notable for the following components:   Hemoglobin 12.9 (*)    RDW 16.2 (*)    All other components within normal limits  HEMOGLOBIN A1C - Abnormal; Notable for the following components:   Hgb A1c  MFr Bld 6.7 (*)    All other components within normal limits  GLUCOSE, CAPILLARY - Abnormal; Notable for the following components:   Glucose-Capillary 140 (*)    All other components within normal limits  GLUCOSE, CAPILLARY - Abnormal; Notable for the following components:   Glucose-Capillary 126 (*)    All other components within normal limits  GLUCOSE, CAPILLARY - Abnormal; Notable for the following components:   Glucose-Capillary 123 (*)    All other components within normal limits  TROPONIN I (HIGH SENSITIVITY) - Abnormal; Notable for the following components:   Troponin I (High Sensitivity) 49 (*)    All other components within normal limits  TROPONIN I (HIGH SENSITIVITY) - Abnormal; Notable for the following components:   Troponin I (High Sensitivity) 59 (*)    All other components within normal limits  TROPONIN I (HIGH SENSITIVITY) - Abnormal; Notable for the following components:   Troponin I (High Sensitivity) 45 (*)    All other components within normal limits  TROPONIN I (HIGH SENSITIVITY) - Abnormal; Notable for the following components:   Troponin I (High Sensitivity) 45 (*)    All other components within normal limits  RESP PANEL BY RT-PCR (FLU A&B, COVID) ARPGX2  MRSA NEXT GEN BY PCR, NASAL  TSH  BRAIN NATRIURETIC PEPTIDE  MAGNESIUM  PHOSPHORUS  GLUCOSE, CAPILLARY  BASIC METABOLIC PANEL  MAGNESIUM  CBC    EKG EKG Interpretation  Date/Time:  Monday February 04 2021 19:59:02 EST Ventricular Rate:  147 PR Interval:    QRS Duration: 92 QT Interval:  287 QTC Calculation: 449 R Axis:   -87 Text Interpretation: Atrial fibrillation with rapid V-rate Left anterior fascicular block Abnormal R-wave  progression, late transition Confirmed by Godfrey Pick [phone]) on 02/04/2021 9:06:11 PM  Radiology DG Chest Port 1 View  Result Date: 02/04/2021 CLINICAL DATA:  Atrial fibrillation with rapid ventricular response, tachycardia, weakness EXAM: PORTABLE CHEST 1 VIEW  COMPARISON:  08/23/2020 FINDINGS: Single frontal view of the chest demonstrates a stable cardiac silhouette. Chronic central vascular congestion. Background emphysema without airspace disease, effusion, or pneumothorax. No acute bony abnormalities. IMPRESSION: 1. Emphysema. 2. Chronic central vascular congestion without overt edema. Electronically Signed   By: Randa Ngo M.D.   On: 02/04/2021 20:58    Procedures Procedures    Medications Ordered in ED Medications  apixaban (ELIQUIS) tablet 5 mg (5 mg Oral Given 02/05/21 2217)  fluticasone furoate-vilanterol (BREO ELLIPTA) 100-25 MCG/ACT 1 puff (1 puff Inhalation Given 02/05/21 0823)    And  umeclidinium bromide (INCRUSE ELLIPTA) 62.5 MCG/ACT 1 puff (1 puff Inhalation Given 02/05/21 0823)  Chlorhexidine Gluconate Cloth 2 % PADS 6 each (6 each Topical Given 02/05/21 0942)  feeding supplement (GLUCERNA SHAKE) (GLUCERNA SHAKE) liquid 237 mL (237 mLs Oral Given 02/05/21 2217)  insulin aspart (novoLOG) injection 0-9 Units (0 Units Subcutaneous Not Given 02/05/21 1743)  insulin aspart (novoLOG) injection 0-5 Units (0 Units Subcutaneous Not Given 02/05/21 2218)  atorvastatin (LIPITOR) tablet 40 mg (40 mg Oral Given 02/05/21 0942)  sertraline (ZOLOFT) tablet 25 mg (25 mg Oral Given 02/05/21 2217)  albuterol (VENTOLIN HFA) 108 (90 Base) MCG/ACT inhaler 2 puff (has no administration in time range)  diltiazem (CARDIZEM) tablet 60 mg (60 mg Oral Given 02/05/21 2216)  0.9 %  sodium chloride infusion ( Intravenous New Bag/Given 02/05/21 2033)  acetaminophen (TYLENOL) tablet 650 mg (650 mg Oral Given 02/05/21 0942)  ALPRAZolam (XANAX) tablet 0.25 mg (0.25 mg Oral Given 02/06/21 0034)  diltiazem (CARDIZEM) injection 10 mg (10 mg Intravenous Given 02/04/21 2017)  lactated ringers bolus 1,000 mL (0 mLs Intravenous Stopped 02/04/21 2223)  magnesium sulfate IVPB 2 g 50 mL (0 g Intravenous Stopped 02/04/21 2224)  potassium chloride 10 mEq in 100 mL IVPB (0 mEq Intravenous  Stopped 02/05/21 0110)  ALPRAZolam (XANAX) tablet 0.25 mg (0.25 mg Oral Given 02/04/21 2141)    ED Course/ Medical Decision Making/ A&P                           Medical Decision Making Amount and/or Complexity of Data Reviewed Labs: ordered. Radiology: ordered.  Risk Prescription drug management. Decision regarding hospitalization.   This patient presents to the ED for concern of vomiting, diarrhea, and generalized weakness, this involves an extensive number of treatment options, and is a complaint that carries with it a high risk of complications and morbidity.  The differential diagnosis includes gastroenteritis, polypharmacy, medication withdrawal, pneumonia   Co morbidities that complicate the patient evaluation  COPD, chronic hypoxia, atrial fibrillation, HLD, HTN   Additional history obtained:  Additional history obtained from EMS, patient's grandson External records from outside source obtained and reviewed including EMR    Lab Tests:  I Ordered, and personally interpreted labs.  The pertinent results include: Normal hemoglobin, no leukocytosis, slight elevation in creatinine from baseline, mild hyperglycemia, mild hypokalemia, hypomagnesemia, mildly elevated troponin   Imaging Studies ordered:  I ordered imaging studies including chest x-ray I independently visualized and interpreted imaging which showed chronic emphysematous change in addition to mild pulmonary vascular congestion I agree with the radiologist interpretation   Cardiac Monitoring:  The patient was maintained on a cardiac monitor.  I personally viewed and interpreted the cardiac monitored which showed an underlying rhythm of: Atrial fibrillation with RVR   Medicines ordered and prescription drug management:  I ordered medication including diltiazem for rate control; IV fluids for dehydration; potassium and magnesium for electrolyte replacement Reevaluation of the patient after these medicines  showed that the patient improved I have reviewed the patients home medicines and have made adjustments as needed   Critical Interventions:  diltiazem for rate control; IV fluids for dehydration; potassium and magnesium for electrolyte replacement; admission to hospital  Problem List / ED Course:  Pleasant 76 year old male with history of COPD and atrial fibrillation, presenting for generalized weakness following 2 days of vomiting and diarrhea.  He was found to be in atrial fibrillation with RVR with EMS.  20 mg of diltiazem was given prior to arrival.  On arrival, patient remains tachycardic.  Initial blood pressure on the low range of normal.  He was given additional 10 mg bolus of diltiazem and started on a diltiazem gtt.  He had slight softening up blood pressures and a bolus of IV fluids was ordered for fluid replacement following his vomiting, diarrhea, and p.o. intolerance.  Patient subsequently had improved heart rate and was able to maintain normal blood pressures.  Lab work showed hypokalemia and hypomagnesemia.  These electrolytes were replaced.  Patient was kept on a diltiazem gtt. at 5 mg/h.  He was admitted to the hospitalist for further management.   Reevaluation:  After the interventions noted above, I reevaluated the patient and found that they have :improved   Social Determinants of Health:  Patient has medical insurance and access to outpatient care.  He is followed by both cardiology and pulmonology.   Dispostion:  After consideration of the diagnostic results and the patients response to treatment, I feel that the patent would benefit from admission to the hospital.    CRITICAL CARE Performed by: Godfrey Pick   Total critical care time: 35 minutes  Critical care time was exclusive of separately billable procedures and treating other patients.  Critical care was necessary to treat or prevent imminent or life-threatening deterioration.  Critical care was time  spent personally by me on the following activities: development of treatment plan with patient and/or surrogate as well as nursing, discussions with consultants, evaluation of patient's response to treatment, examination of patient, obtaining history from patient or surrogate, ordering and performing treatments and interventions, ordering and review of laboratory studies, ordering and review of radiographic studies, pulse oximetry and re-evaluation of patient's condition.         Final Clinical Impression(s) / ED Diagnoses Final diagnoses:  Vomiting and diarrhea  Atrial fibrillation with RVR (HCC)  Generalized weakness    Rx / DC Orders ED Discharge Orders          Ordered    Amb referral to [facility]        02/04/21 2001              Godfrey Pick, MD 02/06/21 [phone]

## 2021-02-05 DIAGNOSIS — R778 Other specified abnormalities of plasma proteins: Secondary | ICD-10-CM

## 2021-02-05 DIAGNOSIS — F32A Depression, unspecified: Secondary | ICD-10-CM

## 2021-02-05 DIAGNOSIS — R1110 Vomiting, unspecified: Secondary | ICD-10-CM

## 2021-02-05 DIAGNOSIS — E876 Hypokalemia: Secondary | ICD-10-CM

## 2021-02-05 DIAGNOSIS — I4891 Unspecified atrial fibrillation: Secondary | ICD-10-CM | POA: Diagnosis not present

## 2021-02-05 DIAGNOSIS — J449 Chronic obstructive pulmonary disease, unspecified: Secondary | ICD-10-CM

## 2021-02-05 DIAGNOSIS — R531 Weakness: Secondary | ICD-10-CM

## 2021-02-05 DIAGNOSIS — R7989 Other specified abnormal findings of blood chemistry: Secondary | ICD-10-CM

## 2021-02-05 DIAGNOSIS — E782 Mixed hyperlipidemia: Secondary | ICD-10-CM

## 2021-02-05 DIAGNOSIS — E1165 Type 2 diabetes mellitus with hyperglycemia: Secondary | ICD-10-CM

## 2021-02-05 DIAGNOSIS — E871 Hypo-osmolality and hyponatremia: Secondary | ICD-10-CM

## 2021-02-05 DIAGNOSIS — I10 Essential (primary) hypertension: Secondary | ICD-10-CM

## 2021-02-05 DIAGNOSIS — N179 Acute kidney failure, unspecified: Secondary | ICD-10-CM | POA: Diagnosis not present

## 2021-02-05 DIAGNOSIS — I480 Paroxysmal atrial fibrillation: Secondary | ICD-10-CM | POA: Diagnosis not present

## 2021-02-05 LAB — GLUCOSE, CAPILLARY
Glucose-Capillary: 140 mg/dL — ABNORMAL HIGH (ref 70–99)
Glucose-Capillary: 126 mg/dL — ABNORMAL HIGH (ref 70–99)
Glucose-Capillary: 92 mg/dL (ref 70–99)
Glucose-Capillary: 123 mg/dL — ABNORMAL HIGH (ref 70–99)

## 2021-02-05 LAB — COMPREHENSIVE METABOLIC PANEL
ALT: 21 U/L (ref 0–44)
AST: 30 U/L (ref 15–41)
Albumin: 3.5 g/dL (ref 3.5–5.0)
Alkaline Phosphatase: 75 U/L (ref 38–126)
Anion gap: 12 (ref 5–15)
BUN: 43 mg/dL — ABNORMAL HIGH (ref 8–23)
CO2: 28 mmol/L (ref 22–32)
Calcium: 8.4 mg/dL — ABNORMAL LOW (ref 8.9–10.3)
Chloride: 94 mmol/L — ABNORMAL LOW (ref 98–111)
Creatinine, Ser: 1.4 mg/dL — ABNORMAL HIGH (ref 0.61–1.24)
GFR, Estimated: 51 mL/min — ABNORMAL LOW (ref >60)
Glucose, Bld: 143 mg/dL — ABNORMAL HIGH (ref 70–99)
Potassium: 3.7 mmol/L (ref 3.5–5.1)
Sodium: 134 mmol/L — ABNORMAL LOW (ref 135–145)
Total Bilirubin: 1 mg/dL (ref 0.3–1.2)
Total Protein: 6.7 g/dL (ref 6.5–8.1)

## 2021-02-05 LAB — HEMOGLOBIN A1C
Hgb A1c MFr Bld: 6.7 % — ABNORMAL HIGH (ref 4.8–5.6)
Mean Plasma Glucose: 145.59 mg/dL

## 2021-02-05 LAB — CBC
HCT: 40.7 % (ref 39.0–52.0)
Hemoglobin: 12.9 g/dL — ABNORMAL LOW (ref 13.0–17.0)
MCH: 29.5 pg (ref 26.0–34.0)
MCHC: 31.7 g/dL (ref 30.0–36.0)
MCV: 92.9 fL (ref 80.0–100.0)
Platelets: 182 10*3/uL (ref 150–400)
RBC: 4.38 MIL/uL (ref 4.22–5.81)
RDW: 16.2 % — ABNORMAL HIGH (ref 11.5–15.5)
WBC: 7.6 10*3/uL (ref 4.0–10.5)
nRBC: 0 % (ref 0.0–0.2)

## 2021-02-05 LAB — PHOSPHORUS: Phosphorus: 4 mg/dL (ref 2.5–4.6)

## 2021-02-05 LAB — MRSA NEXT GEN BY PCR, NASAL: MRSA by PCR Next Gen: NOT DETECTED

## 2021-02-05 LAB — TROPONIN I (HIGH SENSITIVITY)
Troponin I (High Sensitivity): 45 ng/L — ABNORMAL HIGH (ref <18)
Troponin I (High Sensitivity): 45 ng/L — ABNORMAL HIGH (ref <18)

## 2021-02-05 LAB — MAGNESIUM: Magnesium: 2.1 mg/dL (ref 1.7–2.4)

## 2021-02-05 MED ORDER — DILTIAZEM HCL 60 MG PO TABS
60.0000 mg | ORAL_TABLET | Freq: Three times a day (TID) | ORAL | Status: DC
Start: 2021-02-05 — End: 2021-02-07
  Administered 2021-02-05 – 2021-02-07 (×8): 60 mg via ORAL
  Filled 2021-02-05 (×9): qty 1

## 2021-02-05 MED ORDER — AMIODARONE HCL IN DEXTROSE 360-4.14 MG/200ML-% IV SOLN
60.0000 mg/h | INTRAVENOUS | Status: DC
  Administered 2021-02-05 (×2): 60 mg/h via INTRAVENOUS
  Filled 2021-02-05: qty 200

## 2021-02-05 MED ORDER — ACETAMINOPHEN 325 MG PO TABS
650.0000 mg | ORAL_TABLET | Freq: Four times a day (QID) | ORAL | Status: DC | PRN
  Administered 2021-02-05: 650 mg via ORAL
  Filled 2021-02-05: qty 2

## 2021-02-05 MED ORDER — INSULIN ASPART 100 UNIT/ML IJ SOLN: 0.0000 [IU] | Freq: Every day | INTRAMUSCULAR | Status: DC

## 2021-02-05 MED ORDER — ALBUTEROL SULFATE HFA 108 (90 BASE) MCG/ACT IN AERS: 2.0000 | INHALATION_SPRAY | Freq: Four times a day (QID) | RESPIRATORY_TRACT | Status: DC | PRN

## 2021-02-05 MED ORDER — SODIUM CHLORIDE 0.9 % IV SOLN: INTRAVENOUS | Status: DC

## 2021-02-05 MED ORDER — INSULIN ASPART 100 UNIT/ML IJ SOLN
0.0000 [IU] | Freq: Three times a day (TID) | INTRAMUSCULAR | Status: DC
  Administered 2021-02-05 – 2021-02-07 (×4): 1 [IU] via SUBCUTANEOUS

## 2021-02-05 MED ORDER — ATORVASTATIN CALCIUM 40 MG PO TABS
40.0000 mg | ORAL_TABLET | Freq: Every day | ORAL | Status: DC
  Administered 2021-02-05 – 2021-02-07 (×3): 40 mg via ORAL
  Filled 2021-02-05 (×4): qty 1

## 2021-02-05 MED ORDER — ALPRAZOLAM 0.25 MG PO TABS
0.2500 mg | ORAL_TABLET | Freq: Two times a day (BID) | ORAL | Status: DC | PRN
  Administered 2021-02-05 – 2021-02-06 (×3): 0.25 mg via ORAL
  Filled 2021-02-05 (×3): qty 1

## 2021-02-05 MED ORDER — SERTRALINE HCL 50 MG PO TABS
25.0000 mg | ORAL_TABLET | Freq: Every day | ORAL | Status: DC
  Administered 2021-02-05 – 2021-02-06 (×2): 25 mg via ORAL
  Filled 2021-02-05 (×2): qty 1

## 2021-02-05 MED ORDER — CHLORHEXIDINE GLUCONATE CLOTH 2 % EX PADS
6.0000 | MEDICATED_PAD | Freq: Every day | CUTANEOUS | Status: DC
  Administered 2021-02-05: 6 via TOPICAL

## 2021-02-05 MED ORDER — GLUCERNA SHAKE PO LIQD
237.0000 mL | Freq: Three times a day (TID) | ORAL | Status: DC
  Administered 2021-02-05 – 2021-02-07 (×7): 237 mL via ORAL

## 2021-02-05 MED ORDER — AMIODARONE HCL IN DEXTROSE 360-4.14 MG/200ML-% IV SOLN
30.0000 mg/h | INTRAVENOUS | Status: DC
  Filled 2021-02-05: qty 200

## 2021-02-05 NOTE — Progress Notes (Signed)
PROGRESS NOTE    Bryan Hamilton  GEX:[id number] DOB: 1945-04-03 DOA: 02/04/2021 PCP: Celene Squibb, MD   Brief Narrative:   Bryan Hamilton is a 76 y.o. male with medical history significant for COPD on 4 LPM of oxygen at baseline, hypertension, hyperlipidemia, chronic back pain, atrial fibrillation on Eliquis, anxiety, T2DM, depression who presents to the emergency department due to generalized weakness.  He was noted to have some nausea and vomiting as well as diarrhea at home and may have missed some doses of his Cardizem and metoprolol.  He was initially started on IV Cardizem drip, but was noted to have some hypotension and then was switched over to IV amiodarone.  Cardiology has assessed patient with discontinuation of IV amiodarone and initiation of oral Cardizem.  Assessment & Plan:   Principal Problem:   Paroxysmal atrial fibrillation with RVR (HCC) Active Problems:   Elevated troponin   Hypomagnesemia   Hypokalemia   Hyponatremia   Hyperglycemia due to diabetes mellitus (HCC)   AKI (acute kidney injury) ([place])   Essential hypertension   Mixed hyperlipidemia   COPD (chronic obstructive pulmonary disease) (HCC)   Vomiting and diarrhea   Generalized weakness   Depression   Permanent atrial fibrillation with noted RVR -IV amiodarone discontinued -Oral diltiazem 60 mg 3 times daily with hold parameters -Likely resume home Lopressor tomorrow -Appreciate cardiology recommendations -Likely okay for transfer to telemetry later  Elevated troponin -Likely secondary to above and is related to demand ischemia  AKI -Baseline creatinine 1.1-1.3 -Likely prerenal in the setting of N/V/D -Continue to monitor with additional 500 mL normal saline bolus today -Avoid nephrotoxic agents  Hypokalemia/hypomagnesemia -In the setting of nausea, vomiting, diarrhea -Received repletion, recheck labs in a.m.  Hyperglycemia secondary to type 2 diabetes-improved -Hemoglobin A1c  6.7% -SSI  COPD with chronic hypoxemia -Currently on 4 L and wears 4 L at home -Breathing treatments as needed  Essential hypertension -Resumed on oral diltiazem, monitor carefully  Dyslipidemia -Lipitor  Depression -Zoloft   DVT prophylaxis: Eliquis Code Status: Full Family Communication: None at bedside Disposition Plan:  Status is: Inpatient  Remains inpatient appropriate because: Requires close monitoring of heart rates and ongoing IV medications.   Consultants:  Cardiology  Procedures:  See below  Antimicrobials:  None   Subjective: Patient seen and evaluated today with no new acute complaints or concerns. No acute concerns or events noted overnight.  Heart rates have shown some signs of improvement as well as blood pressures.  He denies any palpitations, chest pain, or dyspnea.  Objective: Vitals:   02/05/21 0300 02/05/21 0400 02/05/21 0500 02/05/21 0521  BP: (!) 145/75 126/76 139/60   Pulse: (!) 117 (!) 112 (!) 108 (!) 102  Resp: (!) 21 20 (!) 21 (!) 22  Temp:    (!) 97.3 F (36.3 C)  TempSrc:    Axillary  SpO2: 96% 91% 92% 94%  Weight:    101.3 kg  Height:        Intake/Output Summary (Last 24 hours) at 02/05/2021 1056 Last data filed at 02/05/2021 0335 Gross per 24 hour  Intake 1502.21 ml  Output --  Net 1502.21 ml   Filed Weights   02/04/21 1956 02/05/21 0200 02/05/21 0521  Weight: 101.6 kg 100.2 kg 101.3 kg    Examination:  General exam: Appears calm and comfortable  Respiratory system: Clear to auscultation. Respiratory effort normal.  4 L nasal cannula. Cardiovascular system: S1 & S2 heard, irregular and tachycardic Gastrointestinal system: Abdomen  is soft Central nervous system: Alert and awake Extremities: No edema Skin: No significant lesions noted Psychiatry: Flat affect.    Data Reviewed: I have personally reviewed following labs and imaging studies  CBC: Recent Labs  Lab 02/04/21 2016 02/05/21 0413  WBC 6.6 7.6   NEUTROABS 5.7  --   HGB 14.7 12.9*  HCT 45.2 40.7  MCV 93.4 92.9  PLT 226 878   Basic Metabolic Panel: Recent Labs  Lab 02/04/21 2016 02/05/21 0413  NA 131* 134*  K 3.4* 3.7  CL 92* 94*  CO2 27 28  GLUCOSE 186* 143*  BUN 44* 43*  CREATININE 1.74* 1.40*  CALCIUM 8.7* 8.4*  MG 1.4* 2.1  PHOS  --  4.0   GFR: Estimated Creatinine Clearance: 50.2 mL/min (A) (by C-G formula based on SCr of 1.4 mg/dL (H)). Liver Function Tests: Recent Labs  Lab 02/05/21 0413  AST 30  ALT 21  ALKPHOS 75  BILITOT 1.0  PROT 6.7  ALBUMIN 3.5   No results for input(s): LIPASE, AMYLASE in the last 168 hours. No results for input(s): AMMONIA in the last 168 hours. Coagulation Profile: No results for input(s): INR, PROTIME in the last 168 hours. Cardiac Enzymes: No results for input(s): CKTOTAL, CKMB, CKMBINDEX, TROPONINI in the last 168 hours. BNP (last 3 results) No results for input(s): PROBNP in the last 8760 hours. HbA1C: Recent Labs    02/04/21 2016  HGBA1C 6.7*   CBG: Recent Labs  Lab 02/05/21 0750  GLUCAP 140*   Lipid Profile: No results for input(s): CHOL, HDL, LDLCALC, TRIG, CHOLHDL, LDLDIRECT in the last 72 hours. Thyroid Function Tests: Recent Labs    02/04/21 2016  TSH 2.951   Anemia Panel: No results for input(s): VITAMINB12, FOLATE, FERRITIN, TIBC, IRON, RETICCTPCT in the last 72 hours. Sepsis Labs: No results for input(s): PROCALCITON, LATICACIDVEN in the last 168 hours.  Recent Results (from the past 240 hour(s))  Resp Panel by RT-PCR (Flu A&B, Covid) Nasopharyngeal Swab     Status: None   Collection Time: 02/04/21  8:42 PM   Specimen: Nasopharyngeal Swab; Nasopharyngeal(NP) swabs in vial transport medium  Result Value Ref Range Status   SARS Coronavirus 2 by RT PCR NEGATIVE NEGATIVE Final    Comment: (NOTE) SARS-CoV-2 target nucleic acids are NOT DETECTED.  The SARS-CoV-2 RNA is generally detectable in upper respiratory specimens during the acute  phase of infection. The lowest concentration of SARS-CoV-2 viral copies this assay can detect is 138 copies/mL. A negative result does not preclude SARS-Cov-2 infection and should not be used as the sole basis for treatment or other patient management decisions. A negative result may occur with  improper specimen collection/handling, submission of specimen other than nasopharyngeal swab, presence of viral mutation(s) within the areas targeted by this assay, and inadequate number of viral copies(<138 copies/mL). A negative result must be combined with clinical observations, patient history, and epidemiological information. The expected result is Negative.  Fact Sheet for Patients:  EntrepreneurPulse.com.au  Fact Sheet for Healthcare Providers:  IncredibleEmployment.be  This test is no t yet approved or cleared by the [place] FDA and  has been authorized for detection and/or diagnosis of SARS-CoV-2 by FDA under an Emergency Use Authorization (EUA). This EUA will remain  in effect (meaning this test can be used) for the duration of the COVID-19 declaration under Section 564(b)(1) of the Act, 21 U.S.C.section 360bbb-3(b)(1), unless the authorization is terminated  or revoked sooner.       Influenza A  by PCR NEGATIVE NEGATIVE Final   Influenza B by PCR NEGATIVE NEGATIVE Final    Comment: (NOTE) The Xpert Xpress SARS-CoV-2/FLU/RSV plus assay is intended as an aid in the diagnosis of influenza from Nasopharyngeal swab specimens and should not be used as a sole basis for treatment. Nasal washings and aspirates are unacceptable for Xpert Xpress SARS-CoV-2/FLU/RSV testing.  Fact Sheet for Patients: EntrepreneurPulse.com.au  Fact Sheet for Healthcare Providers: IncredibleEmployment.be  This test is not yet approved or cleared by the [place] FDA and has been authorized for detection and/or diagnosis of  SARS-CoV-2 by FDA under an Emergency Use Authorization (EUA). This EUA will remain in effect (meaning this test can be used) for the duration of the COVID-19 declaration under Section 564(b)(1) of the Act, 21 U.S.C. section 360bbb-3(b)(1), unless the authorization is terminated or revoked.  Performed at [facility], [address], [place], [place] [address]          Radiology Studies: [facility] Chest [facility] 1 View  Result Date: 02/04/2021 CLINICAL DATA:  Atrial fibrillation with rapid ventricular response, tachycardia, weakness EXAM: PORTABLE CHEST 1 VIEW COMPARISON:  08/23/2020 FINDINGS: Single frontal view of the chest demonstrates a stable cardiac silhouette. Chronic central vascular congestion. Background emphysema without airspace disease, effusion, or pneumothorax. No acute bony abnormalities. IMPRESSION: 1. Emphysema. 2. Chronic central vascular congestion without overt edema. Electronically Signed   By: Randa Ngo M.D.   On: 02/04/2021 20:58        Scheduled Meds:  apixaban  5 mg Oral BID   atorvastatin  40 mg Oral Daily   Chlorhexidine Gluconate Cloth  6 each Topical Daily   diltiazem  60 mg Oral TID   feeding supplement (GLUCERNA SHAKE)  237 mL Oral TID BM   fluticasone furoate-vilanterol  1 puff Inhalation Daily   And   umeclidinium bromide  1 puff Inhalation Daily   insulin aspart  0-5 Units Subcutaneous QHS   insulin aspart  0-9 Units Subcutaneous TID WC   sertraline  25 mg Oral QHS   Continuous Infusions:  sodium chloride 100 mL/hr at 02/05/21 0939     LOS: 1 day    Time spent: 35 minutes    Birda Didonato Darleen Crocker, DO [facility]  If 7PM-7AM, please contact night-coverage [email or web address] 02/05/2021, 10:56 AM

## 2021-02-05 NOTE — TOC Initial Note (Addendum)
Transition of Care [facility]) - Initial/Assessment Note    Patient Details  Name: Bryan Hamilton MRN: [id number] Date of Birth: 09/08/1945  Transition of [facility]) CM/SW Contact:    Boneta Lucks, RN Phone Number: 02/05/2021, 10:56 AM  Clinical Narrative:   Patient admitted with Paroxysmal atrial fibrillation with RVR.  Patient live at home with his spouse, walks with a cane and still drives himself to appointments. He has no interest in home health at this time. TOC to follow for discharge needs.                Expected Discharge Plan: Home/Self Care Barriers to Discharge: Continued Medical Work up  Patient Goals and CMS Choice Patient states their goals for this hospitalization and ongoing recovery are:: to go home. CMS Medicare.gov Compare Post Acute Care list provided to:: Patient Choice offered to / list presented to : Patient  Expected Discharge Plan and Services Expected Discharge Plan: Home/Self Care      Prior Living Arrangements/Services   Lives with:: Spouse  Current home services: DME   Activities of Daily Living Home Assistive Devices/Equipment: Cane (specify quad or straight) (four foot cane) ADL Screening (condition at time of admission) Patient's cognitive ability adequate to safely complete daily activities?: Yes Is the patient deaf or have difficulty hearing?: No Does the patient have difficulty seeing, even when wearing glasses/contacts?: No Does the patient have difficulty concentrating, remembering, or making decisions?: No Patient able to express need for assistance with ADLs?: Yes Does the patient have difficulty dressing or bathing?: Yes Independently performs ADLs?: Yes (appropriate for developmental age) Does the patient have difficulty walking or climbing stairs?: Yes Weakness of Legs: Both Weakness of Arms/Hands: None  Permission Sought/Granted     Emotional Assessment    Affect (typically observed): Accepting Orientation: : Oriented to  Self, Oriented to Place, Oriented to  Time, Oriented to Situation Alcohol / Substance Use: Not Applicable Psych Involvement: No (comment)  Admission diagnosis:  Paroxysmal atrial fibrillation with RVR (HCC) [I48.0] Paroxysmal atrial fibrillation (HCC) [I48.0] Patient Active Problem List   Diagnosis Date Noted   Paroxysmal atrial fibrillation ([place]) 02/05/2021   Elevated troponin 02/05/2021   Hypomagnesemia 02/05/2021   Hypokalemia 02/05/2021   Hyponatremia 02/05/2021   Hyperglycemia due to diabetes mellitus ([place]) 02/05/2021   AKI (acute kidney injury) ([place]) 02/05/2021   Essential hypertension 02/05/2021   Mixed hyperlipidemia 02/05/2021   COPD (chronic obstructive pulmonary disease) ([place]) 02/05/2021   Vomiting and diarrhea 02/05/2021   Generalized weakness 02/05/2021   Depression 02/05/2021   Paroxysmal atrial fibrillation with RVR ([place]) 02/04/2021   Nocturia 11/02/2019   Prostate cancer ([place]) 07/27/2019   Elevated PSA 07/13/2019   Right hip pain    New onset atrial fibrillation ([place]) 05/12/2019   PCP:  Celene Squibb, MD Pharmacy:   [place], [place] - [place] [address] [place] [address] [place] [address] Phone: [phone] Fax: [phone]  Readmission Risk Interventions Readmission Risk Prevention Plan 02/05/2021  Transportation Screening Complete  HRI or Home Care Consult Complete  Social Work Consult for [place] Planning/Counseling Complete  Palliative Care Screening Not Applicable  Medication Review Press photographer) Complete  Some recent data might be hidden

## 2021-02-05 NOTE — Consult Note (Signed)
Cardiology Consultation:   Patient ID: Bryan Hamilton MRN: [id number]; DOB: Aug 09, 1945  Admit date: 02/04/2021 Date of Consult: 02/05/2021  PCP:  Bryan Squibb, MD   [facility] HeartCare Providers Cardiologist:  Larae Grooms, MD        Patient Profile:   Bryan Hamilton is a 76 y.o. male with a hx of COPD on home O2, chronic diastolic HF, afib who is being seen 02/05/2021 for the evaluation of afib with RVR  at the request of Dr Manuella Ghazi.  History of Present Illness:   Mr. Dome 76 yo male history of afib, COPD on home O2, chronic diastolic HF, DM2, HTN, presents with generalized weakness. Reports N/V/D. In ER found to be in afib with RVR rates to 180s. Given IV cardizem rates down to 130s but soft bp 90s/60s.    Na 131 K 3.4 BUN 44 Cr 1.74 Mg 1.4 TSH 2.9 BNP 55 WBC 6.6 Hgb 14.7 Plt 226 Trop 49-->59-->45-->45 COVID neg flu neg CXR chronic central congestion, no evert edema.  EKG afib, RVR  04/2019 echo: LVE 24-09%, indet diastolic, normal RV, mod elevated PASP Past Medical History:  Diagnosis Date   Anxiety    Arrhythmia    Arthritis    Asthma    Atrial fibrillation (HCC)    CHF (congestive heart failure) (HCC)    COPD (chronic obstructive pulmonary disease) ([place])    Depression    Diabetes mellitus without complication (HCC)    tpe 2    Dysrhythmia    afib    GERD (gastroesophageal reflux disease)    Hyperchloremia    Hypertension    PONV (postoperative nausea and vomiting)    Prostate cancer [facility])     Past Surgical History:  Procedure Laterality Date   BACK SURGERY     CYSTOSCOPY N/A 10/14/2019   Procedure: CYSTOSCOPY FLEXIBLE;  Surgeon: Cleon Gustin, MD;  [facility];  Service: Urology;  Laterality: N/A;  NO SEEDS FOUND IN BLADDER   PROSTATE BIOPSY N/A 07/20/2019   Procedure: PROSTATE BIOPSY;  Surgeon: Cleon Gustin, MD;  Location: [facility];  Service: Urology;  Laterality: N/A;   RADIOACTIVE SEED IMPLANT N/A 10/14/2019    Procedure: RADIOACTIVE SEED IMPLANT/BRACHYTHERAPY IMPLANT;  Surgeon: Cleon Gustin, MD;  Location: [facility];  Service: Urology;  Laterality: N/A;    69  SEEDS IMPLANTED   SPACE OAR INSTILLATION N/A 10/14/2019   Procedure: SPACE OAR INSTILLATION;  Surgeon: Cleon Gustin, MD;  Location: [facility];  Service: Urology;  Laterality: N/A;      Inpatient Medications: Scheduled Meds:  apixaban  5 mg Oral BID   atorvastatin  40 mg Oral Daily   Chlorhexidine Gluconate Cloth  6 each Topical Daily   diltiazem  60 mg Oral TID   feeding supplement (GLUCERNA SHAKE)  237 mL Oral TID BM   fluticasone furoate-vilanterol  1 puff Inhalation Daily   And   umeclidinium bromide  1 puff Inhalation Daily   insulin aspart  0-5 Units Subcutaneous QHS   insulin aspart  0-9 Units Subcutaneous TID WC   sertraline  25 mg Oral QHS   Continuous Infusions:  sodium chloride     PRN Meds: albuterol  Allergies:   No Known Allergies  Social History:   Social History   Socioeconomic History   Marital status: Married    Spouse name: Not on file   Number of children: 2   Years of education: 39  Highest education level: Not on file  Occupational History   Not on file  Tobacco Use   Smoking status: Former    Packs/day: 2.00    Years: 40.00    Pack years: 80.00    Types: Cigarettes    Quit date: 01/21/2008    Years since quitting: 13.0   Smokeless tobacco: Never  Vaping Use   Vaping Use: Never used  Substance and Sexual Activity   Alcohol use: Not Currently    Comment: rarely    Drug use: Never   Sexual activity: Not Currently  Other Topics Concern   Not on file  Social History Narrative   Not on file   Social Determinants of Health   Financial Resource Strain: Not on file  Food Insecurity: Not on file  Transportation Needs: Not on file  Physical Activity: Not on file  Stress: Not on file  Social Connections: Not on file  Intimate Partner Violence:  Not on file    Family History:    Family History  Problem Relation Age of Onset   Cancer Father        type unknown   Breast cancer Neg Hx    Prostate cancer Neg Hx    Colon cancer Neg Hx    Pancreatic cancer Neg Hx      ROS:  Please see the history of present illness.   All other ROS reviewed and negative.     Physical Exam/Data:   Vitals:   02/05/21 0300 02/05/21 0400 02/05/21 0500 02/05/21 0521  BP: (!) 145/75 126/76 139/60   Pulse: (!) 117 (!) 112 (!) 108 (!) 102  Resp: (!) 21 20 (!) 21 (!) 22  Temp:    (!) 97.3 F (36.3 C)  TempSrc:    Axillary  SpO2: 96% 91% 92% 94%  Weight:    101.3 kg  Height:        Intake/Output Summary (Last 24 hours) at 02/05/2021 0916 Last data filed at 02/05/2021 0335 Gross per 24 hour  Intake 1502.21 ml  Output --  Net 1502.21 ml   Last 3 Weights 02/05/2021 02/05/2021 02/04/2021  Weight (lbs) 223 lb 5.2 oz 220 lb 14.4 oz 224 lb  Weight (kg) 101.3 kg 100.2 kg 101.606 kg     Body mass index is 33.96 kg/m.  General:  Well nourished, well developed, in no acute distress HEENT: normal Neck: no JVD Vascular: No carotid bruits; Distal pulses 2+ bilaterally Cardiac:  irreg, no m//rg Lungs:  faint bilaterl wheezing Abd: soft, nontender, no hepatomegaly  Ext: no edema Musculoskeletal:  No deformities, BUE and BLE strength normal and equal Skin: warm and dry  Neuro:  CNs 2-12 intact, no focal abnormalities noted Psych:  Normal affect     Relevant CV Studies:   Laboratory Data:  High Sensitivity Troponin:   Recent Labs  Lab 02/04/21 2016 02/04/21 2300 02/05/21 0355 02/05/21 0601  TROPONINIHS 49* 59* 45* 45*     Chemistry Recent Labs  Lab 02/04/21 2016 02/05/21 0413  NA 131* 134*  K 3.4* 3.7  CL 92* 94*  CO2 27 28  GLUCOSE 186* 143*  BUN 44* 43*  CREATININE 1.74* 1.40*  CALCIUM 8.7* 8.4*  MG 1.4* 2.1  GFRNONAA 40* 51*  ANIONGAP 12 12    Recent Labs  Lab 02/05/21 0413  PROT 6.7  ALBUMIN 3.5  AST 30  ALT 21   ALKPHOS 75  BILITOT 1.0   Lipids No results for input(s): CHOL, TRIG, HDL, LABVLDL,  LDLCALC, CHOLHDL in the last 168 hours.  Hematology Recent Labs  Lab 02/04/21 2016 02/05/21 0413  WBC 6.6 7.6  RBC 4.84 4.38  HGB 14.7 12.9*  HCT 45.2 40.7  MCV 93.4 92.9  MCH 30.4 29.5  MCHC 32.5 31.7  RDW 16.1* 16.2*  PLT 226 182   Thyroid  Recent Labs  Lab 02/04/21 2016  TSH 2.951    BNP Recent Labs  Lab 02/04/21 2016  BNP 55.0    DDimer No results for input(s): DDIMER in the last 168 hours.   Radiology/Studies:  DG Chest Port 1 View  Result Date: 02/04/2021 CLINICAL DATA:  Atrial fibrillation with rapid ventricular response, tachycardia, weakness EXAM: PORTABLE CHEST 1 VIEW COMPARISON:  08/23/2020 FINDINGS: Single frontal view of the chest demonstrates a stable cardiac silhouette. Chronic central vascular congestion. Background emphysema without airspace disease, effusion, or pneumothorax. No acute bony abnormalities. IMPRESSION: 1. Emphysema. 2. Chronic central vascular congestion without overt edema. Electronically Signed   By: Randa Ngo M.D.   On: 02/04/2021 20:58     Assessment and Plan:   1.Afib, long standing - history of afib. He is on dilt 180 and lopressor 50mg  bid at home along with eliquis - from notes afib diagnosed 05/13/19 during admission. He was rate controlled. I do not see a prior attempt at cardioversion to restore sinus rhythm. All EKGs since that time show afib  - presented with afib with RVR in setting of N/V/D and hypovolemia - low bp's after dilt yesterday - transiently started on amio gtt overnight due to low bp's with dilt - bp's improved with IVFs. D/c IV amio, start oral diltiazem 60mg  tid with hold parameters. LIkely restart his home lopressor tomorrow.    2.AKI - Cr 1.74 on admission, 1.28 4 months ago - reported N/V/D at home , likely prerenal. Cr trending down after IVFs - additoinal 561mL of NS today.     3.Hypokalemia/hypomagnesemia - in setting of N/V/D - receieved IV Mg 2g, potassium IV 30 mEq.  4. Elevated troponin - very mild in setting of significant tachycardia, suspect demand ischemia - no plans for ischemic testin.     Risk Assessment/Risk Scores:  {  [id number]-DHRC Score = 5  {Click here to calculate score.  This indicates a 7.2% annual risk of stroke. The patient's score is based upon: CHF History: 1 HTN History: 1 Diabetes History: 1 Stroke History: 0 Vascular Disease History: 0 Age Score: 2 Gender Score: 0        For questions or updates, please contact [place] Please consult [email or web address] for contact info under    Signed, Carlyle Dolly, MD  02/05/2021 9:16 AM

## 2021-02-05 NOTE — ED Notes (Signed)
Cardizem stopped per hospitalist

## 2021-02-06 DIAGNOSIS — N179 Acute kidney failure, unspecified: Secondary | ICD-10-CM | POA: Diagnosis not present

## 2021-02-06 DIAGNOSIS — I480 Paroxysmal atrial fibrillation: Secondary | ICD-10-CM | POA: Diagnosis not present

## 2021-02-06 DIAGNOSIS — J449 Chronic obstructive pulmonary disease, unspecified: Secondary | ICD-10-CM | POA: Diagnosis not present

## 2021-02-06 DIAGNOSIS — R778 Other specified abnormalities of plasma proteins: Secondary | ICD-10-CM | POA: Diagnosis not present

## 2021-02-06 LAB — GLUCOSE, CAPILLARY
Glucose-Capillary: 132 mg/dL — ABNORMAL HIGH (ref 70–99)
Glucose-Capillary: 112 mg/dL — ABNORMAL HIGH (ref 70–99)
Glucose-Capillary: 118 mg/dL — ABNORMAL HIGH (ref 70–99)
Glucose-Capillary: 111 mg/dL — ABNORMAL HIGH (ref 70–99)

## 2021-02-06 LAB — BASIC METABOLIC PANEL
Anion gap: 9 (ref 5–15)
BUN: 26 mg/dL — ABNORMAL HIGH (ref 8–23)
CO2: 30 mmol/L (ref 22–32)
Calcium: 8.2 mg/dL — ABNORMAL LOW (ref 8.9–10.3)
Chloride: 96 mmol/L — ABNORMAL LOW (ref 98–111)
Creatinine, Ser: 0.97 mg/dL (ref 0.61–1.24)
GFR, Estimated: >60 mL/min (ref >60)
Glucose, Bld: 121 mg/dL — ABNORMAL HIGH (ref 70–99)
Potassium: 3.7 mmol/L (ref 3.5–5.1)
Sodium: 135 mmol/L (ref 135–145)

## 2021-02-06 LAB — MAGNESIUM: Magnesium: 2 mg/dL (ref 1.7–2.4)

## 2021-02-06 LAB — CBC
HCT: 38.4 % — ABNORMAL LOW (ref 39.0–52.0)
Hemoglobin: 11.8 g/dL — ABNORMAL LOW (ref 13.0–17.0)
MCH: 29.4 pg (ref 26.0–34.0)
MCHC: 30.7 g/dL (ref 30.0–36.0)
MCV: 95.5 fL (ref 80.0–100.0)
Platelets: 172 10*3/uL (ref 150–400)
RBC: 4.02 MIL/uL — ABNORMAL LOW (ref 4.22–5.81)
RDW: 16.1 % — ABNORMAL HIGH (ref 11.5–15.5)
WBC: 3.8 10*3/uL — ABNORMAL LOW (ref 4.0–10.5)
nRBC: 0 % (ref 0.0–0.2)

## 2021-02-06 MED ORDER — ALFUZOSIN HCL ER 10 MG PO TB24
10.0000 mg | ORAL_TABLET | Freq: Every day | ORAL | Status: DC
  Administered 2021-02-07: 10 mg via ORAL
  Filled 2021-02-06 (×2): qty 1

## 2021-02-06 MED ORDER — METOPROLOL TARTRATE 50 MG PO TABS
50.0000 mg | ORAL_TABLET | Freq: Two times a day (BID) | ORAL | Status: DC
  Administered 2021-02-06 – 2021-02-07 (×3): 50 mg via ORAL
  Filled 2021-02-06 (×4): qty 1

## 2021-02-06 MED ORDER — SORBITOL 70 % SOLN
960.0000 mL | TOPICAL_OIL | Freq: Once | ORAL | Status: AC
  Administered 2021-02-07: 960 mL via RECTAL
  Filled 2021-02-06: qty 473

## 2021-02-06 NOTE — Progress Notes (Signed)
Telemetry reviewed this AM, afib with elevated rates 100s-120s. Started on diltiazem 60mg  tid yesterday, bp's are tolerating. He had been on dilt long acting 180 and lopressor 50mg  bid at home, held initially due to low bp's in setting of hypovolemia. Continue short acting diltiazem 60mg  tid today, add back lopressor 50mg  bid today and monitor rates and bp's. AKI has resolved with IVFs.   Carlyle Dolly MD

## 2021-02-06 NOTE — Progress Notes (Signed)
Pt resting quietly in bed. Has been OOB to recliner this am, up for 4.5 hours then to bathroom with standby assist, and back to bed. Condom cath intact draining clear yellow urine. IV sites intact x2. Remains on O2 @ 4lpm [place], SaO2 95-99%. Tele shows afib 80-90's. Pt denies any c/o chest pain or SOB. Feet elevated on pillows per pt request.

## 2021-02-06 NOTE — Progress Notes (Signed)
PROGRESS NOTE    Bryan Hamilton  YQI:[id number] DOB: [birth year] DOA: 02/04/2021 PCP: Celene Squibb, MD   Brief Narrative:   Bryan Hamilton is a 76 y.o. male with medical history significant for COPD on 4 LPM of oxygen at baseline, hypertension, hyperlipidemia, chronic back pain, atrial fibrillation on Eliquis, anxiety, T2DM, depression who presents to the emergency department due to generalized weakness.  He was noted to have some nausea and vomiting as well as diarrhea at home and may have missed some doses of his Cardizem and metoprolol.  He was initially started on IV Cardizem drip, but was noted to have some hypotension and then was switched over to IV amiodarone.  Cardiology has assessed patient with discontinuation of IV amiodarone and initiation of oral Cardizem.  Assessment & Plan:   Principal Problem:   Paroxysmal atrial fibrillation with RVR (HCC) Active Problems:   Elevated troponin   Hypomagnesemia   Hypokalemia   Hyponatremia   Hyperglycemia due to diabetes mellitus (HCC)   AKI (acute kidney injury) ([place])   Essential hypertension   Mixed hyperlipidemia   COPD (chronic obstructive pulmonary disease) (HCC)   Vomiting and diarrhea   Generalized weakness   Depression   Permanent atrial fibrillation with noted RVR -IV amiodarone discontinued -Oral diltiazem 60 mg 3 times daily with hold parameters -He is also been restarted on Lopressor -Appreciate cardiology recommendations -He is anticoagulated with Eliquis  Elevated troponin -Likely secondary to above and is related to demand ischemia  AKI -Baseline creatinine 1.1-1.3 -Likely prerenal in the setting of N/V/D -Continue to monitor with additional 500 mL normal saline bolus today -Avoid nephrotoxic agents  Hypokalemia/hypomagnesemia -In the setting of nausea, vomiting, diarrhea -Received repletion, recheck labs in a.m.  Hyperglycemia secondary to type 2 diabetes-improved -Hemoglobin A1c  6.7% -SSI  COPD with chronic hypoxemia -Currently on 4 L and wears 4 L at home -Breathing treatments as needed  Essential hypertension -Resumed on oral diltiazem, monitor carefully  Dyslipidemia -Lipitor  Depression -Zoloft  Constipation -We will give an enema today   DVT prophylaxis: Eliquis Code Status: Full Family Communication: Updated patient's daughter over the phone Disposition Plan:  Status is: Inpatient  Remains inpatient appropriate because: Requires close monitoring of heart rates and ongoing IV medications.   Consultants:  Cardiology  Procedures:  See below  Antimicrobials:  None   Subjective: Denies any chest pain or shortness of breath.  Feels that her abdomen is distended.  Has not had a good bowel movement in several days.  He did have a bowel movement earlier today, but feels as though his abdomen is still full and he would feel better with further bowel movements.  Objective: Vitals:   02/06/21 0715 02/06/21 0927 02/06/21 1444 02/06/21 1732  BP:  123/76 118/71 128/71  Pulse:  (!) 110 85 85  Resp:  20 18 18   Temp:   98.6 F (37 C)   TempSrc:      SpO2: 97% 96% 98% 97%  Weight:      Height:        Intake/Output Summary (Last 24 hours) at 02/06/2021 1928 Last data filed at 02/06/2021 1733 Gross per 24 hour  Intake 1307 ml  Output 1200 ml  Net 107 ml   Filed Weights   02/04/21 1956 02/05/21 0200 02/05/21 0521  Weight: 101.6 kg 100.2 kg 101.3 kg    Examination:  General exam: Alert, awake, oriented x 3 Respiratory system: Clear to auscultation. Respiratory effort normal. Cardiovascular system:RRR. No  murmurs, rubs, gallops. Gastrointestinal system: Abdomen is distended, full and nontender. No organomegaly or masses felt. Normal bowel sounds heard. Central nervous system: Alert and oriented. No focal neurological deficits. Extremities: No C/C/E, +pedal pulses Skin: No rashes, lesions or ulcers Psychiatry: Judgement and insight  appear normal. Mood & affect appropriate.      Data Reviewed: I have personally reviewed following labs and imaging studies  CBC: Recent Labs  Lab 02/04/21 2016 02/05/21 0413 02/06/21 0600  WBC 6.6 7.6 3.8*  NEUTROABS 5.7  --   --   HGB 14.7 12.9* 11.8*  HCT 45.2 40.7 38.4*  MCV 93.4 92.9 95.5  PLT 226 182 264   Basic Metabolic Panel: Recent Labs  Lab 02/04/21 2016 02/05/21 0413 02/06/21 0600  NA 131* 134* 135  K 3.4* 3.7 3.7  CL 92* 94* 96*  CO2 27 28 30   GLUCOSE 186* 143* 121*  BUN 44* 43* 26*  CREATININE 1.74* 1.40* 0.97  CALCIUM 8.7* 8.4* 8.2*  MG 1.4* 2.1 2.0  PHOS  --  4.0  --    GFR: Estimated Creatinine Clearance: 72.4 mL/min (by C-G formula based on SCr of 0.97 mg/dL). Liver Function Tests: Recent Labs  Lab 02/05/21 0413  AST 30  ALT 21  ALKPHOS 75  BILITOT 1.0  PROT 6.7  ALBUMIN 3.5   No results for input(s): LIPASE, AMYLASE in the last 168 hours. No results for input(s): AMMONIA in the last 168 hours. Coagulation Profile: No results for input(s): INR, PROTIME in the last 168 hours. Cardiac Enzymes: No results for input(s): CKTOTAL, CKMB, CKMBINDEX, TROPONINI in the last 168 hours. BNP (last 3 results) No results for input(s): PROBNP in the last 8760 hours. HbA1C: Recent Labs    02/04/21 2016  HGBA1C 6.7*   CBG: Recent Labs  Lab 02/05/21 1609 02/05/21 2126 02/06/21 0732 02/06/21 1143 02/06/21 1707  GLUCAP 92 123* 132* 112* 118*   Lipid Profile: No results for input(s): CHOL, HDL, LDLCALC, TRIG, CHOLHDL, LDLDIRECT in the last 72 hours. Thyroid Function Tests: Recent Labs    02/04/21 2016  TSH 2.951   Anemia Panel: No results for input(s): VITAMINB12, FOLATE, FERRITIN, TIBC, IRON, RETICCTPCT in the last 72 hours. Sepsis Labs: No results for input(s): PROCALCITON, LATICACIDVEN in the last 168 hours.  Recent Results (from the past 240 hour(s))  Resp Panel by RT-PCR (Flu A&B, Covid) Nasopharyngeal Swab     Status: None    Collection Time: 02/04/21  8:42 PM   Specimen: Nasopharyngeal Swab; Nasopharyngeal(NP) swabs in vial transport medium  Result Value Ref Range Status   SARS Coronavirus 2 by RT PCR NEGATIVE NEGATIVE Final    Comment: (NOTE) SARS-CoV-2 target nucleic acids are NOT DETECTED.  The SARS-CoV-2 RNA is generally detectable in upper respiratory specimens during the acute phase of infection. The lowest concentration of SARS-CoV-2 viral copies this assay can detect is 138 copies/mL. A negative result does not preclude SARS-Cov-2 infection and should not be used as the sole basis for treatment or other patient management decisions. A negative result may occur with  improper specimen collection/handling, submission of specimen other than nasopharyngeal swab, presence of viral mutation(s) within the areas targeted by this assay, and inadequate number of viral copies(<138 copies/mL). A negative result must be combined with clinical observations, patient history, and epidemiological information. The expected result is Negative.  Fact Sheet for Patients:  EntrepreneurPulse.com.au  Fact Sheet for Healthcare Providers:  IncredibleEmployment.be  This test is no t yet approved or cleared by the  [place] [employer] and  has been authorized for detection and/or diagnosis of SARS-CoV-2 by FDA under an Print production planner (EUA). This EUA will remain  in effect (meaning this test can be used) for the duration of the COVID-19 declaration under Section 564(b)(1) of the Act, 21 U.S.C.section 360bbb-3(b)(1), unless the authorization is terminated  or revoked sooner.       Influenza A by PCR NEGATIVE NEGATIVE Final   Influenza B by PCR NEGATIVE NEGATIVE Final    Comment: (NOTE) The Xpert Xpress SARS-CoV-2/FLU/RSV plus assay is intended as an aid in the diagnosis of influenza from Nasopharyngeal swab specimens and should not be used as a sole basis for treatment.  Nasal washings and aspirates are unacceptable for Xpert Xpress SARS-CoV-2/FLU/RSV testing.  Fact Sheet for Patients: EntrepreneurPulse.com.au  Fact Sheet for Healthcare Providers: IncredibleEmployment.be  This test is not yet approved or cleared by the [place] FDA and has been authorized for detection and/or diagnosis of SARS-CoV-2 by FDA under an Emergency Use Authorization (EUA). This EUA will remain in effect (meaning this test can be used) for the duration of the COVID-19 declaration under Section 564(b)(1) of the Act, 21 U.S.C. section 360bbb-3(b)(1), unless the authorization is terminated or revoked.  Performed at [facility], [address] [place], [place] [address]   MRSA Next Gen by PCR, Nasal     Status: None   Collection Time: 02/05/21  1:42 AM   Specimen: Nasal Mucosa; Nasal Swab  Result Value Ref Range Status   MRSA by PCR Next Gen NOT DETECTED NOT DETECTED Final    Comment: (NOTE) The GeneXpert MRSA Assay (FDA approved for NASAL specimens only), is one component of a comprehensive MRSA colonization surveillance program. It is not intended to diagnose MRSA infection nor to guide or monitor treatment for MRSA infections. Test performance is not FDA approved in patients less than 69 years old. Performed at [facility], [address], [place], [place] [address]          Radiology Studies: [facility] Chest [facility] 1 View  Result Date: 02/04/2021 CLINICAL DATA:  Atrial fibrillation with rapid ventricular response, tachycardia, weakness EXAM: PORTABLE CHEST 1 VIEW COMPARISON:  08/23/2020 FINDINGS: Single frontal view of the chest demonstrates a stable cardiac silhouette. Chronic central vascular congestion. Background emphysema without airspace disease, effusion, or pneumothorax. No acute bony abnormalities. IMPRESSION: 1. Emphysema. 2. Chronic central vascular congestion without overt edema. Electronically Signed   By: Randa Ngo  M.D.   On: 02/04/2021 20:58        Scheduled Meds:  apixaban  5 mg Oral BID   atorvastatin  40 mg Oral Daily   diltiazem  60 mg Oral TID   feeding supplement (GLUCERNA SHAKE)  237 mL Oral TID BM   fluticasone furoate-vilanterol  1 puff Inhalation Daily   And   umeclidinium bromide  1 puff Inhalation Daily   insulin aspart  0-5 Units Subcutaneous QHS   insulin aspart  0-9 Units Subcutaneous TID WC   metoprolol tartrate  50 mg Oral BID   sertraline  25 mg Oral QHS   sorbitol, milk of mag, mineral oil, glycerin (SMOG) enema  960 mL Rectal Once   Continuous Infusions:     LOS: 2 days    Time spent: 35 minutes    Kathie Dike, MD [facility]  If 7PM-7AM, please contact night-coverage [email or web address] 02/06/2021, 7:28 PM

## 2021-02-07 DIAGNOSIS — I480 Paroxysmal atrial fibrillation: Secondary | ICD-10-CM | POA: Diagnosis not present

## 2021-02-07 DIAGNOSIS — J449 Chronic obstructive pulmonary disease, unspecified: Secondary | ICD-10-CM | POA: Diagnosis not present

## 2021-02-07 DIAGNOSIS — I10 Essential (primary) hypertension: Secondary | ICD-10-CM | POA: Diagnosis not present

## 2021-02-07 DIAGNOSIS — N179 Acute kidney failure, unspecified: Secondary | ICD-10-CM | POA: Diagnosis not present

## 2021-02-07 LAB — GLUCOSE, CAPILLARY
Glucose-Capillary: 108 mg/dL — ABNORMAL HIGH (ref 70–99)
Glucose-Capillary: 129 mg/dL — ABNORMAL HIGH (ref 70–99)
Glucose-Capillary: 111 mg/dL — ABNORMAL HIGH (ref 70–99)

## 2021-02-07 MED ORDER — DILTIAZEM HCL ER COATED BEADS 180 MG PO CP24: 180.0000 mg | ORAL_CAPSULE | Freq: Every day | ORAL | Status: DC

## 2021-02-07 NOTE — Progress Notes (Signed)
Telemetry reviewed this AM, afib with contorlled rates.He is on diltizem 60mg  tid, lopressor 50mg  bid. Got AM dose of diltiazem already, will consolidate to long acting diltiazem 180mg  daily starting tomorrow. Continue lopressor 50mg  bid. Essentially back to his home regimen. Afib exacerbation was secondary to dehydration and GI symptoms.    We will sign off inpatient care and arrange outpatient f/u   Carlyle Dolly MD

## 2021-02-07 NOTE — Discharge Summary (Addendum)
Physician Discharge Summary  Bryan Hamilton UVO:[id number] DOB: October 27, 1945 DOA: 02/04/2021  PCP: Celene Squibb, MD  Admit date: 02/04/2021 Discharge date: 02/07/2021  Admitted From: Home Disposition: Home  Recommendations for Outpatient Follow-up:  Follow up with PCP in 1-2 weeks Please obtain BMP/CBC in one week He will follow-up in atrial fibrillation clinic  Home Health: Equipment/Devices:  Discharge Condition: Stable CODE STATUS: Full code Diet recommendation: Heart healthy  Brief/Interim Summary: 76 year old male with a history of oxygen dependent COPD, atrial fibrillation, admitted to the hospital with GI symptoms including nausea, vomiting and diarrhea.  He was unable to take his Cardizem and metoprolol at home.  He was also noted to be volume depleted.  He was started on IV hydration and due to soft blood pressures, was started on IV amiodarone.  He was seen by cardiology and amiodarone was discontinued.  He was restarted on Cardizem and subsequently metoprolol was also reintroduced.  Overall volume status has improved and is no longer volume depleted.  Heart rate and blood pressure also improved with metoprolol and Cardizem.  He will follow-up with the atrial fibrillation clinic and felt stable for discharge.  Discharge Diagnoses:  Principal Problem:   Paroxysmal atrial fibrillation with RVR (HCC) Active Problems:   Elevated troponin   Hypomagnesemia   Hypokalemia   Hyponatremia   Hyperglycemia due to diabetes mellitus (HCC)   AKI (acute kidney injury) ([place])   Essential hypertension   Mixed hyperlipidemia   COPD (chronic obstructive pulmonary disease) (HCC)   Vomiting and diarrhea   Generalized weakness   Depression   Chronic Respiratory failure with hypoxia  Permanent atrial fibrillation with noted RVR -IV amiodarone discontinued -Oral diltiazem 60 mg 3 times daily with hold parameters -He is also been restarted on Lopressor -Appreciate cardiology  recommendations -He is anticoagulated with Eliquis -Heart rate and blood pressure have been stable on home regimen of diltiazem and metoprolol. -Follow-up in atrial fibrillation clinic.   Elevated troponin -Likely secondary to above and is related to demand ischemia   AKI -Baseline creatinine 1.1-1.3 -Likely prerenal in the setting of N/V/D -Renal function improved with IV fluids -Avoid nephrotoxic agents   Hypokalemia/hypomagnesemia -In the setting of nausea, vomiting, diarrhea -Received repletion   Hyperglycemia secondary to type 2 diabetes-improved -Hemoglobin A1c 6.7% -SSI   COPD/Chronic resp failure with hypoxia -Currently on 4 L and wears 4 L at home -Breathing treatments as needed   Essential hypertension -Resumed on oral diltiazem and metoprolol -Blood pressure stable   Dyslipidemia -Lipitor   Depression -Zoloft   Constipation -Had large bowel movement after enema  Discharge Instructions  Discharge Instructions     Amb referral to AFIB Clinic   Complete by: As directed    Diet - low sodium heart healthy   Complete by: As directed    Increase activity slowly   Complete by: As directed       Allergies as of 02/07/2021   No Known Allergies      Medication List     STOP taking these medications    cephALEXin 500 MG capsule Commonly known as: Keflex       TAKE these medications    acetaminophen 650 MG CR tablet Commonly known as: TYLENOL Take 1,300 mg by mouth 2 (two) times daily as needed for pain.   alfuzosin 10 MG 24 hr tablet Commonly known as: UROXATRAL Take 1 tablet (10 mg total) by mouth daily with breakfast.   ALPRAZolam 0.25 MG tablet Commonly known as: Duanne Moron  Take 0.25 mg by mouth daily as needed for anxiety.   apixaban 5 MG Tabs tablet Commonly known as: ELIQUIS Take 1 tablet (5 mg total) by mouth 2 (two) times daily.   atorvastatin 40 MG tablet Commonly known as: LIPITOR Take 40 mg by mouth daily. What changed:  Another medication with the same name was removed. Continue taking this medication, and follow the directions you see here.   diclofenac Sodium 1 % Gel Commonly known as: VOLTAREN Apply 2 g topically daily as needed (Pain).   diltiazem 180 MG 24 hr capsule Commonly known as: CARDIZEM CD Take 1 capsule (180 mg total) by mouth daily.   furosemide 40 MG tablet Commonly known as: LASIX Take 1-2 tablets by mouth twice daily as needed for swelling   metFORMIN 500 MG 24 hr tablet Commonly known as: GLUCOPHAGE-XR Take 500 mg by mouth daily.   metoprolol tartrate 50 MG tablet Commonly known as: LOPRESSOR Take 50 mg by mouth 2 (two) times daily.   OMEGA 3 PO Take 4 capsules by mouth daily. Last dose on 09/29/19   ProAir HFA 108 (90 Base) MCG/ACT inhaler Generic drug: albuterol Inhale 2 puffs into the lungs every 6 (six) hours as needed for wheezing or shortness of breath.   sertraline 25 MG tablet Commonly known as: ZOLOFT Take 25 mg by mouth at bedtime.   Systane 0.4-0.3 % Soln Generic drug: Polyethyl Glycol-Propyl Glycol Place 1-2 drops into both eyes daily as needed (Dry eye).   traMADol 50 MG tablet Commonly known as: ULTRAM Take 1 tablet (50 mg total) by mouth every 6 (six) hours as needed for moderate pain.   Trelegy Ellipta 100-62.5-25 MCG/ACT Aepb Generic drug: Fluticasone-Umeclidin-Vilant Inhale 1 puff into the lungs daily.        Follow-up Information     Jettie Booze, MD Follow up on 02/22/2021.   Specialties: Cardiology, Radiology, Interventional Cardiology Why: Cardiology Hospital Follow-up on 02/22/2021 at 11:40 AM. Contact information: [address] [place] [address] [phone]                No Known Allergies  Consultations: Cardiology   Procedures/Studies: DG Chest Port 1 View  Result Date: 02/04/2021 CLINICAL DATA:  Atrial fibrillation with rapid ventricular response, tachycardia, weakness EXAM: PORTABLE  CHEST 1 VIEW COMPARISON:  08/23/2020 FINDINGS: Single frontal view of the chest demonstrates a stable cardiac silhouette. Chronic central vascular congestion. Background emphysema without airspace disease, effusion, or pneumothorax. No acute bony abnormalities. IMPRESSION: 1. Emphysema. 2. Chronic central vascular congestion without overt edema. Electronically Signed   By: Randa Ngo M.D.   On: 02/04/2021 20:58      Subjective: He is feeling better.  No shortness of breath or chest pain.  Discharge Exam: Vitals:   02/07/21 0531 02/07/21 0803 02/07/21 0903 02/07/21 1424  BP: 117/68  125/86 123/82  Pulse: 76  76 71  Resp: (!) 21  20 18   Temp: (!) 97.4 F (36.3 C)  (!) 97.5 F (36.4 C) 98 F (36.7 C)  TempSrc: Oral  Axillary Oral  SpO2: 97% 96% 97% 96%  Weight:      Height:        General: Pt is alert, awake, not in acute distress Cardiovascular: RRR, S1/S2 +, no rubs, no gallops Respiratory: CTA bilaterally, no wheezing, no rhonchi Abdominal: Soft, NT, ND, bowel sounds + Extremities: no edema, no cyanosis    The results of significant diagnostics from this hospitalization (including imaging, microbiology, ancillary  and laboratory) are listed below for reference.     Microbiology: Recent Results (from the past 240 hour(s))  Resp Panel by RT-PCR (Flu A&B, Covid) Nasopharyngeal Swab     Status: None   Collection Time: 02/04/21  8:42 PM   Specimen: Nasopharyngeal Swab; Nasopharyngeal(NP) swabs in vial transport medium  Result Value Ref Range Status   SARS Coronavirus 2 by RT PCR NEGATIVE NEGATIVE Final    Comment: (NOTE) SARS-CoV-2 target nucleic acids are NOT DETECTED.  The SARS-CoV-2 RNA is generally detectable in upper respiratory specimens during the acute phase of infection. The lowest concentration of SARS-CoV-2 viral copies this assay can detect is 138 copies/mL. A negative result does not preclude SARS-Cov-2 infection and should not be used as the sole basis for  treatment or other patient management decisions. A negative result may occur with  improper specimen collection/handling, submission of specimen other than nasopharyngeal swab, presence of viral mutation(s) within the areas targeted by this assay, and inadequate number of viral copies(<138 copies/mL). A negative result must be combined with clinical observations, patient history, and epidemiological information. The expected result is Negative.  Fact Sheet for Patients:  EntrepreneurPulse.com.au  Fact Sheet for Healthcare Providers:  IncredibleEmployment.be  This test is no t yet approved or cleared by the [place] FDA and  has been authorized for detection and/or diagnosis of SARS-CoV-2 by FDA under an Emergency Use Authorization (EUA). This EUA will remain  in effect (meaning this test can be used) for the duration of the COVID-19 declaration under Section 564(b)(1) of the Act, 21 U.S.C.section 360bbb-3(b)(1), unless the authorization is terminated  or revoked sooner.       Influenza A by PCR NEGATIVE NEGATIVE Final   Influenza B by PCR NEGATIVE NEGATIVE Final    Comment: (NOTE) The Xpert Xpress SARS-CoV-2/FLU/RSV plus assay is intended as an aid in the diagnosis of influenza from Nasopharyngeal swab specimens and should not be used as a sole basis for treatment. Nasal washings and aspirates are unacceptable for Xpert Xpress SARS-CoV-2/FLU/RSV testing.  Fact Sheet for Patients: EntrepreneurPulse.com.au  Fact Sheet for Healthcare Providers: IncredibleEmployment.be  This test is not yet approved or cleared by the [place] FDA and has been authorized for detection and/or diagnosis of SARS-CoV-2 by FDA under an Emergency Use Authorization (EUA). This EUA will remain in effect (meaning this test can be used) for the duration of the COVID-19 declaration under Section 564(b)(1) of the Act, 21  U.S.C. section 360bbb-3(b)(1), unless the authorization is terminated or revoked.  Performed at [facility], [address] [place], [place] [address]   MRSA Next Gen by PCR, Nasal     Status: None   Collection Time: 02/05/21  1:42 AM   Specimen: Nasal Mucosa; Nasal Swab  Result Value Ref Range Status   MRSA by PCR Next Gen NOT DETECTED NOT DETECTED Final    Comment: (NOTE) The GeneXpert MRSA Assay (FDA approved for NASAL specimens only), is one component of a comprehensive MRSA colonization surveillance program. It is not intended to diagnose MRSA infection nor to guide or monitor treatment for MRSA infections. Test performance is not FDA approved in patients less than 71 years old. Performed at [facility], [address] [place], [place] [address]      Labs: BNP (last 3 results) Recent Labs    08/23/20 1105 02/04/21 2016  BNP 94.0 03.4   Basic Metabolic Panel: Recent Labs  Lab 02/04/21 2016 02/05/21 0413 02/06/21 0600  NA 131* 134* 135  K 3.4* 3.7 3.7  CL 92* 94* 96*  CO2 27 28 30   GLUCOSE 186* 143* 121*  BUN 44* 43* 26*  CREATININE 1.74* 1.40* 0.97  CALCIUM 8.7* 8.4* 8.2*  MG 1.4* 2.1 2.0  PHOS  --  4.0  --    Liver Function Tests: Recent Labs  Lab 02/05/21 0413  AST 30  ALT 21  ALKPHOS 75  BILITOT 1.0  PROT 6.7  ALBUMIN 3.5   No results for input(s): LIPASE, AMYLASE in the last 168 hours. No results for input(s): AMMONIA in the last 168 hours. CBC: Recent Labs  Lab 02/04/21 2016 02/05/21 0413 02/06/21 0600  WBC 6.6 7.6 3.8*  NEUTROABS 5.7  --   --   HGB 14.7 12.9* 11.8*  HCT 45.2 40.7 38.4*  MCV 93.4 92.9 95.5  PLT 226 182 172   Cardiac Enzymes: No results for input(s): CKTOTAL, CKMB, CKMBINDEX, TROPONINI in the last 168 hours. BNP: Invalid input(s): POCBNP CBG: Recent Labs  Lab 02/06/21 1707 02/06/21 2145 02/07/21 0732 02/07/21 1132 02/07/21 1634  GLUCAP 118* 111* 108* 129* 111*   D-Dimer No results for input(s): DDIMER in  the last 72 hours. Hgb A1c No results for input(s): HGBA1C in the last 72 hours. Lipid Profile No results for input(s): CHOL, HDL, LDLCALC, TRIG, CHOLHDL, LDLDIRECT in the last 72 hours. Thyroid function studies No results for input(s): TSH, T4TOTAL, T3FREE, THYROIDAB in the last 72 hours.  Invalid input(s): FREET3 Anemia work up No results for input(s): VITAMINB12, FOLATE, FERRITIN, TIBC, IRON, RETICCTPCT in the last 72 hours. Urinalysis    Component Value Date/Time   APPEARANCEUR Clear 01/09/2021 1336   GLUCOSEU Negative 01/09/2021 1336   BILIRUBINUR Negative 01/09/2021 1336   PROTEINUR Negative 01/09/2021 1336   NITRITE Negative 01/09/2021 1336   LEUKOCYTESUR Negative 01/09/2021 1336   Sepsis Labs Invalid input(s): PROCALCITONIN,  WBC,  LACTICIDVEN Microbiology Recent Results (from the past 240 hour(s))  Resp Panel by RT-PCR (Flu A&B, Covid) Nasopharyngeal Swab     Status: None   Collection Time: 02/04/21  8:42 PM   Specimen: Nasopharyngeal Swab; Nasopharyngeal(NP) swabs in vial transport medium  Result Value Ref Range Status   SARS Coronavirus 2 by RT PCR NEGATIVE NEGATIVE Final    Comment: (NOTE) SARS-CoV-2 target nucleic acids are NOT DETECTED.  The SARS-CoV-2 RNA is generally detectable in upper respiratory specimens during the acute phase of infection. The lowest concentration of SARS-CoV-2 viral copies this assay can detect is 138 copies/mL. A negative result does not preclude SARS-Cov-2 infection and should not be used as the sole basis for treatment or other patient management decisions. A negative result may occur with  improper specimen collection/handling, submission of specimen other than nasopharyngeal swab, presence of viral mutation(s) within the areas targeted by this assay, and inadequate number of viral copies(<138 copies/mL). A negative result must be combined with clinical observations, patient history, and epidemiological information. The expected  result is Negative.  Fact Sheet for Patients:  EntrepreneurPulse.com.au  Fact Sheet for Healthcare Providers:  IncredibleEmployment.be  This test is no t yet approved or cleared by the [place] FDA and  has been authorized for detection and/or diagnosis of SARS-CoV-2 by FDA under an Emergency Use Authorization (EUA). This EUA will remain  in effect (meaning this test can be used) for the duration of the COVID-19 declaration under Section 564(b)(1) of the Act, 21 U.S.C.section 360bbb-3(b)(1), unless the authorization is terminated  or revoked sooner.       Influenza A  by PCR NEGATIVE NEGATIVE Final   Influenza B by PCR NEGATIVE NEGATIVE Final    Comment: (NOTE) The Xpert Xpress SARS-CoV-2/FLU/RSV plus assay is intended as an aid in the diagnosis of influenza from Nasopharyngeal swab specimens and should not be used as a sole basis for treatment. Nasal washings and aspirates are unacceptable for Xpert Xpress SARS-CoV-2/FLU/RSV testing.  Fact Sheet for Patients: EntrepreneurPulse.com.au  Fact Sheet for Healthcare Providers: IncredibleEmployment.be  This test is not yet approved or cleared by the [place] FDA and has been authorized for detection and/or diagnosis of SARS-CoV-2 by FDA under an Emergency Use Authorization (EUA). This EUA will remain in effect (meaning this test can be used) for the duration of the COVID-19 declaration under Section 564(b)(1) of the Act, 21 U.S.C. section 360bbb-3(b)(1), unless the authorization is terminated or revoked.  Performed at [facility], [address], [place], [place] [address]   MRSA Next Gen by PCR, Nasal     Status: None   Collection Time: 02/05/21  1:42 AM   Specimen: Nasal Mucosa; Nasal Swab  Result Value Ref Range Status   MRSA by PCR Next Gen NOT DETECTED NOT DETECTED Final    Comment: (NOTE) The GeneXpert MRSA Assay (FDA approved for NASAL  specimens only), is one component of a comprehensive MRSA colonization surveillance program. It is not intended to diagnose MRSA infection nor to guide or monitor treatment for MRSA infections. Test performance is not FDA approved in patients less than 13 years old. Performed at [facility], [address] [place], [place] [address]      Time coordinating discharge: 50mins  SIGNED:   Kathie Dike, MD  [facility] 02/07/2021, 8:46 PM   If 7PM-7AM, please contact night-coverage [email or web address]

## 2021-02-19 DIAGNOSIS — I10 Essential (primary) hypertension: Secondary | ICD-10-CM | POA: Diagnosis not present

## 2021-02-19 DIAGNOSIS — E782 Mixed hyperlipidemia: Secondary | ICD-10-CM | POA: Diagnosis not present

## 2021-02-21 NOTE — Progress Notes (Signed)
Cardiology Office Note   Date:  02/22/2021   ID:  Bryan Hamilton, DOB 28-May-1945, MRN [id number]  PCP:  Celene Squibb, MD    Chief Complaint  Patient presents with   Follow-up   Chronic diastolic heart failure  Wt Readings from Last 3 Encounters:  02/22/21 224 lb (101.6 kg)  02/05/21 223 lb 5.2 oz (101.3 kg)  12/17/20 220 lb (99.8 kg)       History of Present Illness: Bryan Hamilton is a 76 y.o. male   Who was previously seen by Dr. Bronson Ing for AFib.  No h/o CAD.  Records showed: "The patient presents for posthospitalization follow-up for rapid atrial fibrillation.  I consulted on him on 05/13/2019.   He is here with his daughter, Bryan Hamilton, who would also like to be contacted with any information regarding her father's health [phone]). She plans to move here from [facility] to help take care of her father. He has another daughter who lives in [place] with her family.  That daughter has atrial fibrillation and recently underwent cardioversion."   He has chronic SHOB from COPD.  He was started on ELiquis in 4/21.  Rate control with metoprolol 50 mg BID and Cardizem 240 mg CD.  HR was 108 on this regimen in May 2021 so Cardizem was increased to 300 mg daily.    Echo in 2021 April showed: "Left ventricular ejection fraction, by estimation, is 60 to 65%. The  left ventricle has normal function. Left ventricular endocardial border  not optimally defined to evaluate regional wall motion. There is mild left  ventricular hypertrophy. Left  ventricular diastolic parameters are indeterminate.   2. Right ventricular systolic function is normal. The right ventricular  size is mildly enlarged. There is moderately elevated pulmonary artery  systolic pressure.   3. Left atrial size was mildly dilated.   4. The mitral valve is normal in structure. No evidence of mitral valve  regurgitation. No evidence of mitral stenosis.   5. The aortic valve is tricuspid. Aortic valve  regurgitation is not  visualized. No aortic stenosis is present.   6. The inferior vena cava is normal in size with greater than 50%  respiratory variability, suggesting right atrial pressure of 3 mmHg. "   Plan in 5/22 was : "AFib: rate control.  Eliquis for stroke prevention. No palpitations or bleding issues.  HTN: The current medical regimen is effective;  continue present plan and medications.  Whole food, plant based diet recommended.  Chronic diastolic heart failure: Increase Lasix 40 mg BID prn.  I explained the scenario is when he would take an extra furosemide tablet.  He does weigh himself daily.  He will watch for worsening lower extremity edema or shortness of breath.  Elevate legs as well to help with edema.  Low-salt diet.  Avoid processed foods. Hyperlipidemia: The current medical regimen is effective;  continue present plan and medications. COPD: Continue inhalers."   Seen in the ER on August 23, 2020 with the following history: " c/o bilateral leg swelling R>L. States legs stay swollen, but is new to have right leg more swollen than left. Symptoms acute onset in past week, constant, moderate. Mild erythema to anterior right lower leg. No pain to leg. No numbness/weakness. No chest pain. States feels breathing at baseline, states always some sob and doe due to copd. Former smoker. Denies chest pain or discomfort or any pleuritic pain. No new or increased wob. ?orthopnea. No pnd. Use  albuterol tx prn. No hx dvt or pe, and indicates is compliant w eliquis therapy. "   No DVT by ultrasound.  Normal BNP as well.   Lasix increased for 3 days in Nov 2022.  Had blister on foot with some drainage nad was treated with antibiotics.   In 12/2020, he appeared euvlemic from a resp standpoint but had LE edema. Plan was to; " Increase Lasix to 40 to 80 mg p.o. twice daily as needed.  This should give him the flexibility of taking an additional tablet that will help with any weight gain.  Continue  daily weights at home.  Elevate legs."  Hospitalized at [facility] in January 2023: " admitted to the hospital with GI symptoms including nausea, vomiting and diarrhea.  He was unable to take his Cardizem and metoprolol at home.  He was also noted to be volume depleted.  He was started on IV hydration and due to soft blood pressures, was started on IV amiodarone.  He was seen by cardiology and amiodarone was discontinued.  He was restarted on Cardizem and subsequently metoprolol was also reintroduced.  Overall volume status has improved and is no longer volume depleted.  Heart rate and blood pressure also improved with metoprolol and Cardizem.  He will follow-up with the atrial fibrillation clinic and felt stable for discharge."  Home BP readings in the 009-381 range systolic.   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    Dysrhythmia    afib    GERD (gastroesophageal reflux disease)    Hyperchloremia    Hypertension    PONV (postoperative nausea and vomiting)    Prostate cancer [facility])     Past Surgical History:  Procedure Laterality Date   BACK SURGERY     CYSTOSCOPY N/A 10/14/2019   Procedure: CYSTOSCOPY FLEXIBLE;  Surgeon: Cleon Gustin, MD;  Location: [facility];  Service: Urology;  Laterality: N/A;  NO SEEDS FOUND IN BLADDER   PROSTATE BIOPSY N/A 07/20/2019   Procedure: PROSTATE BIOPSY;  Surgeon: Cleon Gustin, MD;  Location: [facility];  Service: Urology;  Laterality: N/A;   RADIOACTIVE SEED IMPLANT N/A 10/14/2019   Procedure: RADIOACTIVE SEED IMPLANT/BRACHYTHERAPY IMPLANT;  Surgeon: Cleon Gustin, MD;  Location[facility];  Service: Urology;  Laterality: N/A;    69  SEEDS IMPLANTED   SPACE OAR INSTILLATION  N/A 10/14/2019   Procedure: SPACE OAR INSTILLATION;  Surgeon: Cleon Gustin, MD;  Location: [facility];  Service: Urology;  Laterality: N/A;     Current Outpatient Medications  Medication Sig Dispense Refill   acetaminophen (TYLENOL) 650 MG CR tablet Take 1,300 mg by mouth 2 (two) times daily as needed for pain.     alfuzosin (UROXATRAL) 10 MG 24 hr tablet Take 1 tablet (10 mg total) by mouth daily with breakfast. 90 tablet 3   ALPRAZolam (XANAX) 0.25 MG tablet Take 0.25 mg by mouth daily as needed for anxiety.      apixaban (ELIQUIS) 5 MG TABS tablet Take 1 tablet (5 mg total) by mouth 2 (two) times daily. 60 tablet 11   atorvastatin (LIPITOR) 40 MG tablet Take 40 mg by mouth  daily.     diclofenac Sodium (VOLTAREN) 1 % GEL Apply 2 g topically daily as needed (Pain).     diltiazem (CARDIZEM CD) 180 MG 24 hr capsule Take 1 capsule (180 mg total) by mouth daily. 90 capsule 3   furosemide (LASIX) 40 MG tablet Take 1-2 tablets by mouth twice daily as needed for swelling 360 tablet 1   metFORMIN (GLUCOPHAGE-XR) 500 MG 24 hr tablet Take 500 mg by mouth daily.     metoprolol tartrate (LOPRESSOR) 50 MG tablet Take 50 mg by mouth 2 (two) times daily.     Omega-3 Fatty Acids (OMEGA 3 PO) Take 4 capsules by mouth daily. Last dose on 09/29/19     Polyethyl Glycol-Propyl Glycol (SYSTANE) 0.4-0.3 % SOLN Place 1-2 drops into both eyes daily as needed (Dry eye).      PROAIR HFA 108 (90 Base) MCG/ACT inhaler Inhale 2 puffs into the lungs every 6 (six) hours as needed for wheezing or shortness of breath.      sertraline (ZOLOFT) 25 MG tablet Take 25 mg by mouth at bedtime.      traMADol (ULTRAM) 50 MG tablet Take 1 tablet (50 mg total) by mouth every 6 (six) hours as needed for moderate pain. 12 tablet 0   TRELEGY ELLIPTA 100-62.5-25 MCG/INH AEPB Inhale 1 puff into the lungs daily.     No current facility-administered medications for this visit.    Allergies:   Patient has no known  allergies.    Social History:  The patient  reports that he quit smoking about 13 years ago. His smoking use included cigarettes. He has a 80.00 pack-year smoking history. He has never used smokeless tobacco. He reports that he does not currently use alcohol. He reports that he does not use drugs.   Family History:  The patient's family history includes Cancer in his father.    ROS:  Please see the history of present illness.   Otherwise, review of systems are positive for leg swelling.   All other systems are reviewed and negative.    PHYSICAL EXAM: VS:  BP 92/60    Pulse 97    Ht 5\' 8"  (1.727 m)    Wt 224 lb (101.6 kg)    SpO2 98%    BMI 34.06 kg/m  , BMI Body mass index is 34.06 kg/m. GEN: Well nourished, well developed, in no acute distress HEENT: normal Neck: no JVD, carotid bruits, or masses Cardiac: irregularly irregular; no murmurs, rubs, or gallops,; bilateral pitting LE edema  Respiratory:  clear to auscultation bilaterally, normal work of breathing GI: soft, nontender, nondistended, + BS MS: no deformity or atrophy Skin: warm and dry, no rash Neuro:  Strength and sensation are intact Psych: euthymic mood, full affect   EKG:   The ekg ordered today demonstrates A. fib, rate controlled   Recent Labs: 02/04/2021: B Natriuretic Peptide 55.0; TSH 2.951 02/05/2021: ALT 21 02/06/2021: BUN 26; Creatinine, Ser 0.97; Hemoglobin 11.8; Magnesium 2.0; Platelets 172; Potassium 3.7; Sodium 135   Lipid Panel No results found for: CHOL, TRIG, HDL, CHOLHDL, VLDL, LDLCALC, LDLDIRECT   Other studies Reviewed: Additional studies/ records that were reviewed today with results demonstrating: Hospital records reviewed, labs reviewed.   ASSESSMENT AND PLAN:  AFib: Rate controlled.  Eliquis for stroke prevention. HTN: Well-controlled at home.  No change in medications. Chronic diastolic heart failure: Weights at home have been increased.  Continue higher dose of furosemide until lower  extremity swelling improves and weight comes  back to baseline.  Family can then titrate back to a lower dose as needed, as we previously discussed.  Check CBC and bmet today. Hyperlipidemia:  LDL 88 at last check.  Continue atorvastatin. Anticoagulated: Acquired thrombophilia in the setting of atrial fibrillation.  Eliquis for stroke prevention.   Current medicines are reviewed at length with the patient today.  The patient concerns regarding his medicines were addressed.  The following changes have been made:  No change  Labs/ tests ordered today include:  No orders of the defined types were placed in this encounter.   Recommend 150 minutes/week of aerobic exercise Low fat, low carb, high fiber diet recommended  Disposition:   FU in as scheduled   Signed, Larae Grooms, MD  02/22/2021 12:03 PM    [facility] [address], [place], [place]  [address] Phone: [phone]; Fax: [phone]

## 2021-02-22 ENCOUNTER — Ambulatory Visit: Payer: Medicare Other | Admitting: Interventional Cardiology

## 2021-02-22 ENCOUNTER — Other Ambulatory Visit: Payer: Self-pay

## 2021-02-22 ENCOUNTER — Encounter: Payer: Self-pay | Admitting: Interventional Cardiology

## 2021-02-22 VITALS — BP 92/60 | HR 97 | Ht 68.0 in | Wt 224.0 lb

## 2021-02-22 DIAGNOSIS — D6869 Other thrombophilia: Secondary | ICD-10-CM

## 2021-02-22 DIAGNOSIS — I10 Essential (primary) hypertension: Secondary | ICD-10-CM

## 2021-02-22 DIAGNOSIS — I5032 Chronic diastolic (congestive) heart failure: Secondary | ICD-10-CM

## 2021-02-22 DIAGNOSIS — E782 Mixed hyperlipidemia: Secondary | ICD-10-CM | POA: Diagnosis not present

## 2021-02-22 DIAGNOSIS — I4891 Unspecified atrial fibrillation: Secondary | ICD-10-CM

## 2021-02-22 NOTE — Patient Instructions (Signed)
Medication Instructions:  Your physician recommends that you continue on your current medications as directed. Please refer to the Current Medication list given to you today.  *If you need a refill on your cardiac medications before your next appointment, please call your pharmacy*  Lab Work: TODAY: CBC and BMET If you have labs (blood work) drawn today and your tests are completely normal, you will receive your results only by: [place] (if you have MyChart) OR A paper copy in the mail If you have any lab test that is abnormal or we need to change your treatment, we will call you to review the results.  Follow-Up: At [facility], you and your health needs are our priority.  As part of our continuing mission to provide you with exceptional heart care, we have created designated Provider Care Teams.  These Care Teams include your primary Cardiologist (physician) and Advanced Practice Providers (APPs -  Physician Assistants and Nurse Practitioners) who all work together to provide you with the care you need, when you need it.  Your next appointment:   6 month(s)  The format for your next appointment:   In Person  Provider:   Larae Grooms, MD

## 2021-03-03 LAB — BASIC METABOLIC PANEL
BUN/Creatinine Ratio: 15 (ref 10–24)
BUN: 26 mg/dL (ref 8–27)
CO2: 27 mmol/L (ref 20–29)
Calcium: 10 mg/dL (ref 8.6–10.2)
Chloride: 102 mmol/L (ref 96–106)
Creatinine, Ser: 1.75 mg/dL — ABNORMAL HIGH (ref 0.76–1.27)
Glucose: 108 mg/dL — ABNORMAL HIGH (ref 70–99)
Potassium: 4.7 mmol/L (ref 3.5–5.2)
eGFR: 39 mL/min/{1.73_m2} — ABNORMAL LOW (ref >59)

## 2021-03-03 LAB — CBC
Hematocrit: 39 % (ref 37.5–51.0)
Hemoglobin: 12.8 g/dL — ABNORMAL LOW (ref 13.0–17.7)
MCH: 29.4 pg (ref 26.6–33.0)
MCHC: 32.8 g/dL (ref 31.5–35.7)
MCV: 90 fL (ref 79–97)
Platelets: 264 10*3/uL (ref 150–450)
RBC: 4.35 x10E6/uL (ref 4.14–5.80)
RDW: 14.9 % (ref 11.6–15.4)
WBC: 7.6 10*3/uL (ref 3.4–10.8)

## 2021-03-11 ENCOUNTER — Telehealth: Payer: Self-pay | Admitting: *Deleted

## 2021-03-11 DIAGNOSIS — J449 Chronic obstructive pulmonary disease, unspecified: Secondary | ICD-10-CM | POA: Diagnosis not present

## 2021-03-11 DIAGNOSIS — I4891 Unspecified atrial fibrillation: Secondary | ICD-10-CM | POA: Diagnosis not present

## 2021-03-11 DIAGNOSIS — I5032 Chronic diastolic (congestive) heart failure: Secondary | ICD-10-CM | POA: Diagnosis not present

## 2021-03-11 DIAGNOSIS — Z7901 Long term (current) use of anticoagulants: Secondary | ICD-10-CM | POA: Diagnosis not present

## 2021-03-11 NOTE — Telephone Encounter (Signed)
Patient's daughter notified.  She reports weight is down 3 lbs today.  She thinks current weight is 224 lbs.  Patient's legs stay swollen but daughter thinks swelling has improved some.  She will encourage him to try to elevate legs when possible.  She is not sure what dose of furosemide patient is currently taking.  She will check on this and call back with an update.  Daughter aware plan is to try and titrate furosemide back to lower dose of furosemide 40 mg twice daily when swelling improves and weight returns to baseline

## 2021-03-11 NOTE — Telephone Encounter (Signed)
-----   Message from Jettie Booze, MD sent at 02/25/2021 11:59 PM EST ----- Renal function at the lower end of his range.  Hopefully can decrease lasix soon if not already done.  CBC stable.

## 2021-03-15 ENCOUNTER — Encounter: Payer: Self-pay | Admitting: Interventional Cardiology

## 2021-03-15 NOTE — Telephone Encounter (Signed)
Spoke with the patient's daughter who reports recent weights: 2/20: 223 2/21: 223 2/22: 228 2/23: 228 2/24: 230 She states that his shortness of breath and swelling are at baseline and have not worsened.  He has been taking lasix 40 mg twice daily. Advised to go ahead and have the patient take an extra 40 mg today and that I will send a message to Dr. Irish Lack for further advisement.

## 2021-03-18 NOTE — Progress Notes (Signed)
Synopsis: Referred for COPD and 'borderline oxygen saturation' by Celene Squibb, MD  Subjective:   PATIENT ID: Bryan Hamilton GENDER: male DOB: [birth year], MRN: [id number]  Chief Complaint  Patient presents with   Follow-up    Breathing is overall doing well and no new co's today. He is using his albuterol inhaler about once per wk on average.       76yM with history of COPD on trelegy, asthma, AF on eliquis, GERD who is referred for COPD and 'borderline oxygen saturation.'  He has had increasing DOE. Can only walk about 50 yards due to [place]. Over the last 3 months is when he's had this decline - at that point he thinks he could have walked about 100 yards. He doesn't have much of a cough, does feel chest congestion but can't quite bring it up. Currently using albuterol nebulizer 1-2 times daily, albuterol mdi 2-3x daily but doesn't notice much improvement with them. Uses trelegy daily for the last 4 years and does believe it was helpful.  No hospitalizations this year due to COPD or ever. He has received no prescriptions for steroids this year for COPD. Last year got a course of steroids but didn't think it was very helpful wrt dyspnea.   Had ED trip last week due to RLE swelling. Thought there to have cellulitis.  Never has had sleep study  Previous inhalers:  Unsure what he's tried other than trelegy and albuterol   Dad had COPD. Mom had cancer, unsure if lung cancer.   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 Has dog at home. Never has lived outside of [place].   Interval HPI: Has been in discussion with Dr. Irish Lack regarding recent weight gain and lasix dosing.   Using trelegy inhaler, 1 puff once daily, rinsing mouth after using it. Hasn't needed steroids at all since last visit. Still feeling fatigued after hospitalization for norovirus in January.   Otherwise pertinent review of systems is negative.  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    Dysrhythmia    afib    GERD (gastroesophageal reflux disease)    Hyperchloremia    Hypertension    PONV (postoperative nausea and vomiting)    Prostate cancer ([place])      Family History  Problem Relation Age of Onset   Cancer Father        type unknown   Breast cancer Neg Hx    Prostate cancer Neg Hx    Colon cancer Neg Hx    Pancreatic cancer Neg Hx      Past Surgical History:  Procedure Laterality Date   BACK SURGERY     CYSTOSCOPY N/A 10/14/2019   Procedure: CYSTOSCOPY FLEXIBLE;  Surgeon: Cleon Gustin, MD;  Location: [facility];  Service: Urology;  Laterality: N/A;  NO SEEDS FOUND IN BLADDER   PROSTATE BIOPSY N/A 07/20/2019   Procedure: PROSTATE BIOPSY;  Surgeon: Cleon Gustin, MD;  Location: [facility];  Service: Urology;  Laterality: N/A;   RADIOACTIVE SEED IMPLANT N/A 10/14/2019   Procedure: RADIOACTIVE SEED IMPLANT/BRACHYTHERAPY IMPLANT;  Surgeon: Cleon Gustin, MD;  Location: [place];  Service: Urology;  Laterality: N/A;    69  SEEDS IMPLANTED   SPACE OAR INSTILLATION N/A 10/14/2019   Procedure: SPACE OAR INSTILLATION;  Surgeon: Cleon Gustin, MD;  Location[facility];  Service: Urology;  Laterality: N/A;    Social History   Socioeconomic History   Marital status: Married    Spouse name: Not on file   Number of children: 2   Years of education: 14   Highest education level: Not on file  Occupational History   Not on file  Tobacco Use   Smoking status: Former    Packs/day: 2.00    Years: 40.00    Pack years: 80.00    Types: Cigarettes    Quit date: 01/21/2008    Years since quitting: 13.1   Smokeless tobacco: Never  Vaping Use    Vaping Use: Never used  Substance and Sexual Activity   Alcohol use: Not Currently    Comment: rarely    Drug use: Never   Sexual activity: Not Currently  Other Topics Concern   Not on file  Social History Narrative   Not on file   Social Determinants of Health   Financial Resource Strain: Not on file  Food Insecurity: Not on file  Transportation Needs: Not on file  Physical Activity: Not on file  Stress: Not on file  Social Connections: Not on file  Intimate Partner Violence: Not on file     No Known Allergies   Outpatient Medications Prior to Visit  Medication Sig Dispense Refill   acetaminophen (TYLENOL) 650 MG CR tablet Take 1,300 mg by mouth 2 (two) times daily as needed for pain.     alfuzosin (UROXATRAL) 10 MG 24 hr tablet Take 1 tablet (10 mg total) by mouth daily with breakfast. 90 tablet 3   ALPRAZolam (XANAX) 0.25 MG tablet Take 0.25 mg by mouth daily as needed for anxiety.      apixaban (ELIQUIS) 5 MG TABS tablet Take 1 tablet (5 mg total) by mouth 2 (two) times daily. 60 tablet 11   atorvastatin (LIPITOR) 40 MG tablet Take 40 mg by mouth daily.     diclofenac Sodium (VOLTAREN) 1 % GEL Apply 2 g topically daily as needed (Pain).     diltiazem (CARDIZEM CD) 180 MG 24 hr capsule Take 1 capsule (180 mg total) by mouth daily. 90 capsule 3   furosemide (LASIX) 40 MG tablet Take 1-2 tablets by mouth twice daily as needed for swelling 360 tablet 1   metFORMIN (GLUCOPHAGE-XR) 500 MG 24 hr tablet Take 500 mg by mouth daily.     metoprolol tartrate (LOPRESSOR) 50 MG tablet Take 50 mg by mouth 2 (two) times daily.     Omega-3 Fatty Acids (OMEGA 3 PO) Take 4 capsules by mouth daily. Last dose on 09/29/19     Polyethyl Glycol-Propyl Glycol (SYSTANE) 0.4-0.3 % SOLN Place 1-2 drops into both eyes daily as needed (Dry eye).      PROAIR HFA 108 (90 Base) MCG/ACT inhaler Inhale 2 puffs into the lungs every 6 (six) hours as needed for wheezing or shortness of breath.      sertraline  (ZOLOFT) 25 MG tablet Take 25 mg by mouth at bedtime.      traMADol (ULTRAM) 50 MG tablet Take 1 tablet (50 mg total) by mouth every 6 (six) hours as needed for moderate pain. 12 tablet 0   TRELEGY ELLIPTA 100-62.5-25 MCG/INH AEPB Inhale 1 puff  into the lungs daily.     No facility-administered medications prior to visit.       Objective:   Physical Exam:  General appearance: 76 y.o., male, NAD, conversant  Eyes: anicteric sclerae, moist conjunctivae; no lid-lag; PERRL, tracking appropriately HENT: NCAT; oropharynx, MMM, no mucosal ulcerations; normal hard and soft palate Neck: Trachea midline; no lymphadenopathy, no JVD Lungs: exp wheeze bl, with normal respiratory effort CV: RRR, no MRGs  Abdomen: Soft, non-tender; non-distended, BS present  Extremities: trace BLE R>L edema, radial and DP pulses present bilaterally  Skin: Normal temperature, turgor and texture; venous stasis changes over BLE R>L Psych: Appropriate affect Neuro: Alert and oriented to person and place, no focal deficit    Vitals:   03/20/21 1026  BP: 112/68  Pulse: 64  Temp: 98.1 F (36.7 C)  TempSrc: Oral  SpO2: 97%  Weight: 224 lb (101.6 kg)  Height: 5\' 8"  (1.727 m)    97% on 4L O2 BMI Readings from Last 3 Encounters:  03/20/21 34.06 kg/m  02/22/21 34.06 kg/m  02/05/21 33.96 kg/m   Wt Readings from Last 3 Encounters:  03/20/21 224 lb (101.6 kg)  02/22/21 224 lb (101.6 kg)  02/05/21 223 lb 5.2 oz (101.3 kg)     CBC    Component Value Date/Time   WBC 7.6 02/22/2021 1222   WBC 3.8 (L) 02/06/2021 0600   RBC 4.35 02/22/2021 1222   RBC 4.02 (L) 02/06/2021 0600   HGB 12.8 (L) 02/22/2021 1222   HCT 39.0 02/22/2021 1222   PLT 264 02/22/2021 1222   MCV 90 02/22/2021 1222   MCH 29.4 02/22/2021 1222   MCH 29.4 02/06/2021 0600   MCHC 32.8 02/22/2021 1222   MCHC 30.7 02/06/2021 0600   RDW 14.9 02/22/2021 1222   LYMPHSABS 0.2 (L) 02/04/2021 2016   MONOABS 0.6 02/04/2021 2016   EOSABS 0.0  02/04/2021 2016   BASOSABS 0.0 02/04/2021 2016    06/15/20 QTc 399  Chest Imaging: CXR 08/23/20 reviewed by me and remarkable for prominent vascular markings   CT A/p 08/23/20 with RML, lingula scarring, posterior/dependent atelectasis  CT Lung screen 10/31/20 reviewed by me with subtle emphysema, mucus plugging, lower lobe/RML, lingula scarring, dominant nodule 84mm  Pulmonary Functions Testing Results: PFT Results Latest Ref Rng & Units 09/11/2020  FVC-Pre L 2.27  FVC-Predicted Pre % 59  FVC-Post L 2.15  FVC-Predicted Post % 56  Pre FEV1/FVC % % 52  Post FEV1/FCV % % 57  FEV1-Pre L 1.18  FEV1-Predicted Pre % 43  FEV1-Post L 1.22  DLCO uncorrected ml/min/mmHg 8.33  DLCO UNC% % 35  DLVA Predicted % 56  TLC L 6.23  TLC % Predicted % 93  RV % Predicted % 152   Severe obstruction, air trapping, severely reduced DLCO which may be underestimate due to failure to reach 90% IVC during maneuver   Echocardiogram:   TTE 05/13/19:  1. Left ventricular ejection fraction, by estimation, is 60 to 65%. The  left ventricle has normal function. Left ventricular endocardial border  not optimally defined to evaluate regional wall motion. There is mild left  ventricular hypertrophy. Left  ventricular diastolic parameters are indeterminate.   2. Right ventricular systolic function is normal. The right ventricular  size is mildly enlarged. There is moderately elevated pulmonary artery  systolic pressure.   3. Left atrial size was mildly dilated.   4. The mitral valve is normal in structure. No evidence of mitral valve  regurgitation. No evidence of mitral stenosis.  5. The aortic valve is tricuspid. Aortic valve regurgitation is not  visualized. No aortic stenosis is present.   6. The inferior vena cava is normal in size with greater than 50%  respiratory variability, suggesting right atrial pressure of 3 mmHg.    Heart Catheterization: None    Assessment & Plan:    # COPD gold  functional group B: Very limited extent of emphysema on CT. He's not an exacerbator by history but does have chronic productive cough. Still pretty wheezy on exam today, I think he also minimizes his symptoms.  # Chronic hypoxemic respiratory failure: On 4L O2 per  continuous at rest, during exertion, and sleep based on walking oximetry today in the office  # Suspected sleep-disordered breathing:   Plan: - trial breztri 2 puffs twice daily with spacer - instructed how to use, he'll let us know in a month if prefers it for his COPD - albuterol neb or inhaler prn - can use flutter valve 10 slow but firm puffs twice daily after albuterol treatment if productive cough - home sleep study - declines pulmonary rehab referral   RTC 6 months  Maryjane Hurter, MD [facility] 03/20/2021 10:34 AM

## 2021-03-19 NOTE — Telephone Encounter (Signed)
See my chart message

## 2021-03-20 ENCOUNTER — Encounter: Payer: Self-pay | Admitting: Student

## 2021-03-20 ENCOUNTER — Ambulatory Visit: Payer: Medicare Other | Admitting: Student

## 2021-03-20 ENCOUNTER — Other Ambulatory Visit: Payer: Self-pay

## 2021-03-20 VITALS — BP 112/68 | HR 64 | Temp 98.1°F | Ht 68.0 in | Wt 224.0 lb

## 2021-03-20 DIAGNOSIS — J9611 Chronic respiratory failure with hypoxia: Secondary | ICD-10-CM

## 2021-03-20 DIAGNOSIS — J449 Chronic obstructive pulmonary disease, unspecified: Secondary | ICD-10-CM

## 2021-03-20 MED ORDER — BREZTRI AEROSPHERE 160-9-4.8 MCG/ACT IN AERO: 2.0000 | INHALATION_SPRAY | Freq: Two times a day (BID) | RESPIRATORY_TRACT | 0 refills | Status: DC

## 2021-03-20 NOTE — Patient Instructions (Addendum)
-   start breztri 2 puffs twice daily with spacer, rinse mouth and spacer after use  ?- let me know after a month of using breztri if it's helping your breathing and I'll prescribe it long term for you and get you a coupon ?- see you in 6 months  ?

## 2021-03-26 NOTE — Telephone Encounter (Signed)
I placed call to patient's daughter to follow up.  Left message to call office ?

## 2021-04-01 ENCOUNTER — Telehealth: Payer: Self-pay | Admitting: Student

## 2021-04-01 MED ORDER — BREZTRI AEROSPHERE 160-9-4.8 MCG/ACT IN AERO: 2.0000 | INHALATION_SPRAY | Freq: Two times a day (BID) | RESPIRATORY_TRACT | 6 refills | Status: DC

## 2021-04-01 NOTE — Telephone Encounter (Signed)
Breztri sent into patient preferred pharmacy ?

## 2021-04-04 ENCOUNTER — Other Ambulatory Visit: Payer: Self-pay | Admitting: Interventional Cardiology

## 2021-04-11 ENCOUNTER — Telehealth: Payer: Self-pay | Admitting: Interventional Cardiology

## 2021-04-11 NOTE — Telephone Encounter (Signed)
Called [name].  She reports pt swelling is no worse than normal.  However, she is concerned that pt sleeps a lot.  The only time pt is awake for periods of time is to eat meals.  Reports this started about a mouth ago.  Daughter thinks this could be r/t pt kidney's and furosemide dose.   ?She reports pt is urinating a fair amount.  Takes Furosemide 80 mg PO BID.  Decreased to 40 mg BID but swelling increased so increased dose.  Daughter not currently with pt so unable to give weights or BP readings.  Advised daughter to call the office if pt gains 3 pounds in 24 hours or 5 pounds in 1 week. ?Advised daughter to reach out to PCP also in regards to concern of increased sleeping. Also advised it wouldn't hurt to reach out to urologist for input.   She verbalizes understanding.  Expresses will call in tomorrow with pt weights.   ?

## 2021-04-11 NOTE — Telephone Encounter (Signed)
Pt c/o swelling: STAT is pt has developed SOB within 24 hours ? ?How much weight have you gained and in what time span?  ?No major weight gain. Patient's daughter states the patient's weight fluctuates often ? ?If swelling, where is the swelling located?  ?Legs  ? ?Are you currently taking a fluid pill?  ?Yes, patient's daughter states the patient has been taking Furosemide 40 MG--2 tablets twice daily (4 tablets total daily) ? ?Are you currently SOB?  ?No and patient has not been any more SOB than usual ? ?Do you have a log of your daily weights (if so, list)?  ?No log available ? ?Have you gained 3 pounds in a day or 5 pounds in a week?  ?unsure ? ?Have you traveled recently?  ?Unsure  ? ? ?This past month patient's daughter has also had concerns with how often patient has been sleeping. He sleeps during the night and the majority of the day. He only wakes up to eat and use the restroom. She assumes he has been having issues with his kidneys. ? ?

## 2021-04-17 DIAGNOSIS — R5383 Other fatigue: Secondary | ICD-10-CM | POA: Diagnosis not present

## 2021-04-17 DIAGNOSIS — R350 Frequency of micturition: Secondary | ICD-10-CM | POA: Diagnosis not present

## 2021-04-17 DIAGNOSIS — J449 Chronic obstructive pulmonary disease, unspecified: Secondary | ICD-10-CM | POA: Diagnosis not present

## 2021-04-17 DIAGNOSIS — Z79899 Other long term (current) drug therapy: Secondary | ICD-10-CM | POA: Diagnosis not present

## 2021-04-19 ENCOUNTER — Telehealth: Payer: Self-pay | Admitting: Student

## 2021-04-19 ENCOUNTER — Encounter: Payer: Self-pay | Admitting: Interventional Cardiology

## 2021-04-19 DIAGNOSIS — J449 Chronic obstructive pulmonary disease, unspecified: Secondary | ICD-10-CM

## 2021-04-19 NOTE — Telephone Encounter (Signed)
He's currently easily able and attentive enough to do his ADLs per Verdis Frederickson. His drowsiness may improve with more diuresis as cardiology has suggested. If however he becomes drowsier and it's hard to wake him up to eat, etc then I encouraged her to recommend coming to ED. It will be challenging to arrange for NIV quickly unless he is admitted and workup started for consideration of a device like trilogy. I have ordered an ABG to be done on Monday and can be in touch with them about the results.  ? ?[place]  ?[place] ? ?

## 2021-04-19 NOTE — Telephone Encounter (Signed)
Called and spoke with pt's daughter Verdis Frederickson who is concerned due to pt sleeping all the time. She states out of a 24-hour day, pt will sleep about 21 hours as she said he may be awake for about 3 hours a day and then will sleep the remainder of the day. ? ?Verdis Frederickson states the only time she sees him awake is if he is eating but other than that, he will be asleep anywhere he is sitting or laying. ? ?Verdis Frederickson said that this has been going on for about a month now. ? ?Pt wears 4L O2 24/7. ? ?Dr. Verlee Monte, please advise on any recommendations. No available appointment with an APP until next Thursday April 6. ? ? ? ? ?

## 2021-04-20 ENCOUNTER — Other Ambulatory Visit: Payer: Self-pay

## 2021-04-20 ENCOUNTER — Encounter (HOSPITAL_COMMUNITY): Payer: Self-pay | Admitting: Emergency Medicine

## 2021-04-20 ENCOUNTER — Emergency Department (HOSPITAL_COMMUNITY): Payer: Medicare Other

## 2021-04-20 ENCOUNTER — Emergency Department (HOSPITAL_COMMUNITY)
Admission: EM | Admit: 2021-04-20 | Discharge: 2021-04-20 | Disposition: A | Discharge status: Home / Self Care | Payer: Medicare Other | Attending: Emergency Medicine | Admitting: Emergency Medicine

## 2021-04-20 DIAGNOSIS — Z7901 Long term (current) use of anticoagulants: Secondary | ICD-10-CM | POA: Insufficient documentation

## 2021-04-20 DIAGNOSIS — R0600 Dyspnea, unspecified: Secondary | ICD-10-CM | POA: Diagnosis not present

## 2021-04-20 DIAGNOSIS — E119 Type 2 diabetes mellitus without complications: Secondary | ICD-10-CM | POA: Diagnosis not present

## 2021-04-20 DIAGNOSIS — Z7951 Long term (current) use of inhaled steroids: Secondary | ICD-10-CM | POA: Insufficient documentation

## 2021-04-20 DIAGNOSIS — R739 Hyperglycemia, unspecified: Secondary | ICD-10-CM | POA: Diagnosis not present

## 2021-04-20 DIAGNOSIS — Z7984 Long term (current) use of oral hypoglycemic drugs: Secondary | ICD-10-CM | POA: Insufficient documentation

## 2021-04-20 DIAGNOSIS — Z79899 Other long term (current) drug therapy: Secondary | ICD-10-CM | POA: Diagnosis not present

## 2021-04-20 DIAGNOSIS — I480 Paroxysmal atrial fibrillation: Secondary | ICD-10-CM | POA: Diagnosis not present

## 2021-04-20 DIAGNOSIS — R0602 Shortness of breath: Secondary | ICD-10-CM | POA: Diagnosis not present

## 2021-04-20 DIAGNOSIS — I509 Heart failure, unspecified: Secondary | ICD-10-CM | POA: Insufficient documentation

## 2021-04-20 DIAGNOSIS — Z9981 Dependence on supplemental oxygen: Secondary | ICD-10-CM | POA: Insufficient documentation

## 2021-04-20 DIAGNOSIS — Z743 Need for continuous supervision: Secondary | ICD-10-CM | POA: Diagnosis not present

## 2021-04-20 DIAGNOSIS — I499 Cardiac arrhythmia, unspecified: Secondary | ICD-10-CM | POA: Diagnosis not present

## 2021-04-20 DIAGNOSIS — J441 Chronic obstructive pulmonary disease with (acute) exacerbation: Secondary | ICD-10-CM | POA: Diagnosis not present

## 2021-04-20 DIAGNOSIS — R6889 Other general symptoms and signs: Secondary | ICD-10-CM | POA: Diagnosis not present

## 2021-04-20 LAB — BLOOD GAS, ARTERIAL
Acid-Base Excess: 16.2 mmol/L — ABNORMAL HIGH (ref 0.0–2.0)
Bicarbonate: 42.9 mmol/L — ABNORMAL HIGH (ref 20.0–28.0)
Drawn by: 22179
FIO2: 36 %
O2 Saturation: 97.1 %
Patient temperature: 36.6
pCO2 arterial: 58 mmHg — ABNORMAL HIGH (ref 32–48)
pH, Arterial: 7.48 — ABNORMAL HIGH (ref 7.35–7.45)
pO2, Arterial: 77 mmHg — ABNORMAL LOW (ref 83–108)

## 2021-04-20 LAB — CBC WITH DIFFERENTIAL/PLATELET
Abs Immature Granulocytes: 0.06 10*3/uL (ref 0.00–0.07)
Basophils Absolute: 0.1 10*3/uL (ref 0.0–0.1)
Basophils Relative: 1 %
Eosinophils Absolute: 0.3 10*3/uL (ref 0.0–0.5)
Eosinophils Relative: 4 %
HCT: 38.8 % — ABNORMAL LOW (ref 39.0–52.0)
Hemoglobin: 11.9 g/dL — ABNORMAL LOW (ref 13.0–17.0)
Immature Granulocytes: 1 %
Lymphocytes Relative: 8 %
Lymphs Abs: 0.6 10*3/uL — ABNORMAL LOW (ref 0.7–4.0)
MCH: 28.9 pg (ref 26.0–34.0)
MCHC: 30.7 g/dL (ref 30.0–36.0)
MCV: 94.2 fL (ref 80.0–100.0)
Monocytes Absolute: 0.8 10*3/uL (ref 0.1–1.0)
Monocytes Relative: 11 %
Neutro Abs: 5.7 10*3/uL (ref 1.7–7.7)
Neutrophils Relative %: 75 %
Platelets: 209 10*3/uL (ref 150–400)
RBC: 4.12 MIL/uL — ABNORMAL LOW (ref 4.22–5.81)
RDW: 15.9 % — ABNORMAL HIGH (ref 11.5–15.5)
WBC: 7.5 10*3/uL (ref 4.0–10.5)
nRBC: 0 % (ref 0.0–0.2)

## 2021-04-20 LAB — BASIC METABOLIC PANEL
Anion gap: 10 (ref 5–15)
BUN: 25 mg/dL — ABNORMAL HIGH (ref 8–23)
CO2: 39 mmol/L — ABNORMAL HIGH (ref 22–32)
Calcium: 8.9 mg/dL (ref 8.9–10.3)
Chloride: 89 mmol/L — ABNORMAL LOW (ref 98–111)
Creatinine, Ser: 1.21 mg/dL (ref 0.61–1.24)
GFR, Estimated: >60 mL/min (ref >60)
Glucose, Bld: 196 mg/dL — ABNORMAL HIGH (ref 70–99)
Potassium: 3.4 mmol/L — ABNORMAL LOW (ref 3.5–5.1)
Sodium: 138 mmol/L (ref 135–145)

## 2021-04-20 LAB — BRAIN NATRIURETIC PEPTIDE: B Natriuretic Peptide: 73 pg/mL (ref 0.0–100.0)

## 2021-04-20 MED ORDER — PREDNISONE 10 MG PO TABS: 20.0000 mg | ORAL_TABLET | Freq: Every day | ORAL | 0 refills | Status: DC

## 2021-04-20 MED ORDER — ALBUTEROL SULFATE HFA 108 (90 BASE) MCG/ACT IN AERS: 2.0000 | INHALATION_SPRAY | RESPIRATORY_TRACT | Status: DC | PRN

## 2021-04-20 MED ORDER — ALBUTEROL SULFATE (2.5 MG/3ML) 0.083% IN NEBU
INHALATION_SOLUTION | RESPIRATORY_TRACT | Status: AC
  Administered 2021-04-20: 2.5 mg
  Filled 2021-04-20: qty 3

## 2021-04-20 MED ORDER — ALBUTEROL SULFATE (2.5 MG/3ML) 0.083% IN NEBU: 2.5000 mg | INHALATION_SOLUTION | Freq: Once | RESPIRATORY_TRACT | Status: DC

## 2021-04-20 NOTE — ED Triage Notes (Signed)
Pt BIB [facility] for SOB, hx of COPD; has pulmonologist appt Monday; per EMS pt feeling better once in truck and requested to go back home; pt on 4L home O2  ?

## 2021-04-20 NOTE — ED Provider Notes (Signed)
?Bryan Hamilton ?Provider Note ? ? ?CSN: [id number] ?Arrival date & time: 04/20/21  1537 ? ?  ? ?History ? ?Chief Complaint  ?Patient presents with  ? Shortness of Breath  ? ? ?Bryan Hamilton is a 76 y.o. male. ? ?The history is provided by the patient, medical records, a relative and the EMS personnel.  ?Shortness of Breath ? ? Patient with medical history notable for PAF on Eliquis, COPD, CHF, type 2 diabetes presents today due to shortness of breath.  Patient's been feeling short of breath for a month, he is on 4 L of supplemental oxygen at baseline.  He has not had increase his supplemental oxygen and has an appointment pulmonology coming up in 2 days.  Patient states he is here in the ED because his daughter was worried about his shortness of breath. He denies any fevers, cough, lower extremity swelling, chest pain. ? ?Home Medications ?Prior to Admission medications   ?Medication Sig Start Date End Date Taking? Authorizing Provider  ?predniSONE (DELTASONE) 10 MG tablet Take 2 tablets (20 mg total) by mouth daily. 04/20/21  Yes Sherrill Raring, PA-C  ?acetaminophen (TYLENOL) 650 MG CR tablet Take 1,300 mg by mouth 2 (two) times daily as needed for pain.    [provider]  ?alfuzosin (UROXATRAL) 10 MG 24 hr tablet Take 1 tablet (10 mg total) by mouth daily with breakfast. 01/09/21   McKenzie, Candee Furbish, MD  ?ALPRAZolam Duanne Moron) 0.25 MG tablet Take 0.25 mg by mouth daily as needed for anxiety.  07/12/19   [provider]  ?apixaban (ELIQUIS) 5 MG TABS tablet Take 1 tablet (5 mg total) by mouth 2 (two) times daily. 07/18/19   Herminio Commons, MD  ?atorvastatin (LIPITOR) 40 MG tablet Take 40 mg by mouth daily. 01/02/21   [provider]  ?Budeson-Glycopyrrol-Formoterol (BREZTRI AEROSPHERE) 160-9-4.8 MCG/ACT AERO Inhale 2 puffs into the lungs in the morning and at bedtime. 03/20/21   Maryjane Hurter, MD  ?Budeson-Glycopyrrol-Formoterol (BREZTRI AEROSPHERE) 160-9-4.8 MCG/ACT  AERO Inhale 2 puffs into the lungs in the morning and at bedtime. 04/01/21   Maryjane Hurter, MD  ?diclofenac Sodium (VOLTAREN) 1 % GEL Apply 2 g topically daily as needed (Pain).    [provider]  ?diltiazem (CARDIZEM CD) 180 MG 24 hr capsule Take 1 capsule (180 mg total) by mouth daily. 09/10/20   Jettie Booze, MD  ?furosemide (LASIX) 40 MG tablet 1-2 tablets by mouth twice daily as needed for swelling 04/04/21   Jettie Booze, MD  ?metFORMIN (GLUCOPHAGE-XR) 500 MG 24 hr tablet Take 500 mg by mouth daily. 03/14/20   [provider]  ?metoprolol tartrate (LOPRESSOR) 50 MG tablet Take 50 mg by mouth 2 (two) times daily. 05/16/19   [provider]  ?Omega-3 Fatty Acids (OMEGA 3 PO) Take 4 capsules by mouth daily. Last dose on 09/29/19    [provider]  ?Polyethyl Glycol-Propyl Glycol (SYSTANE) 0.4-0.3 % SOLN Place 1-2 drops into both eyes daily as needed (Dry eye).     [provider]  ?PROAIR HFA 108 (90 Base) MCG/ACT inhaler Inhale 2 puffs into the lungs every 6 (six) hours as needed for wheezing or shortness of breath.  03/31/19   [provider]  ?sertraline (ZOLOFT) 25 MG tablet Take 25 mg by mouth at bedtime.  07/12/19   [provider]  ?traMADol (ULTRAM) 50 MG tablet Take 1 tablet (50 mg total) by mouth every 6 (six) hours as needed  for moderate pain. 05/14/19   Orson Eva, MD  ?Donnal Debar 100-62.5-25 MCG/INH AEPB Inhale 1 puff into the lungs daily. 03/31/19   [provider]  ?   ? ?Allergies    ?Patient has no known allergies.   ? ?Review of Systems   ?Review of Systems  ?Respiratory:  Positive for shortness of breath.   ? ?Physical Exam ?Updated Vital Signs ?BP 101/79 (BP Location: Left Arm)   Pulse 81   Temp 98 ?F (36.7 ?C) (Oral)   Resp 18   Ht '5\' 8"'$  (1.727 m)   Wt 104.3 kg   SpO2 96%   BMI 34.97 kg/m?  ?Physical Exam ?Vitals and nursing note reviewed. Exam conducted with a chaperone present.  ?Constitutional:    ?   Appearance: Normal appearance.  ?HENT:  ?   Head: Normocephalic and atraumatic.  ?Eyes:  ?   General: No scleral icterus.    ?   Right eye: No discharge.     ?   Left eye: No discharge.  ?   Extraocular Movements: Extraocular movements intact.  ?   Pupils: Pupils are equal, round, and reactive to light.  ?Cardiovascular:  ?   Rate and Rhythm: Normal rate. Rhythm irregular.  ?   Pulses: Normal pulses.  ?   Heart sounds: Normal heart sounds. No murmur heard. ?  No friction rub. No gallop.  ?Pulmonary:  ?   Effort: Pulmonary effort is normal. No respiratory distress.  ?   Breath sounds: Decreased breath sounds present.  ?   Comments: Decreased lung sounds but no wheezes or rails.  Is breathing comfortably, no tachypnea or accessory muscle use. ?Abdominal:  ?   General: Abdomen is flat. Bowel sounds are normal. There is no distension.  ?   Palpations: Abdomen is soft.  ?   Tenderness: There is no abdominal tenderness.  ?Musculoskeletal:  ?   Right lower leg: Edema present.  ?   Left lower leg: Edema present.  ?   Comments: Bilateral 1+ edema  ?Skin: ?   General: Skin is warm and dry.  ?   Coloration: Skin is not jaundiced.  ?Neurological:  ?   Mental Status: He is alert. Mental status is at baseline.  ?   Coordination: Coordination normal.  ? ? ?ED Results / Procedures / Treatments   ?Labs ?(all labs ordered are listed, but only abnormal results are displayed) ?Labs Reviewed  ?BASIC METABOLIC PANEL - Abnormal; Notable for the following components:  ?    Result Value  ? Potassium 3.4 (*)   ? Chloride 89 (*)   ? CO2 39 (*)   ? Glucose, Bld 196 (*)   ? BUN 25 (*)   ? All other components within normal limits  ?CBC WITH DIFFERENTIAL/PLATELET - Abnormal; Notable for the following components:  ? RBC 4.12 (*)   ? Hemoglobin 11.9 (*)   ? HCT 38.8 (*)   ? RDW 15.9 (*)   ? Lymphs Abs 0.6 (*)   ? All other components within normal limits  ?BLOOD GAS, ARTERIAL - Abnormal; Notable for the following components:  ? pH, Arterial  7.48 (*)   ? pCO2 arterial 58 (*)   ? pO2, Arterial 77 (*)   ? Bicarbonate 42.9 (*)   ? Acid-Base Excess 16.2 (*)   ? All other components within normal limits  ?BRAIN NATRIURETIC PEPTIDE  ? ? ?EKG ?EKG Interpretation ? ?Date/Time:  Saturday April 20 2021 15:44:34 EDT ?Ventricular Rate:  76 ?PR Interval:    ?QRS Duration: 99 ?QT Interval:  391 ?QTC Calculation: 440 ?R Axis:   -15 ?Text Interpretation: Atrial fibrillation Borderline left axis deviation Low voltage, precordial leads no stemi  afib noted on prior Confirmed by Wynona Dove ([id number]) on 04/20/2021 4:53:51 PM ? ?Radiology ?DG Chest Port 1 View ? ?Result Date: 04/20/2021 ?CLINICAL DATA:  Shortness of breath for several days. EXAM: PORTABLE CHEST 1 VIEW COMPARISON:  02/04/2021 chest radiograph and prior studies FINDINGS: The cardiomediastinal silhouette is unremarkable. Bibasilar interstitial opacities are again noted. There is no evidence of focal airspace disease, pulmonary edema, suspicious pulmonary nodule/mass, pleural effusion, or pneumothorax. No acute bony abnormalities are identified. IMPRESSION: 1. No evidence of acute cardiopulmonary disease. 2. Bibasilar interstitial opacities again noted. Electronically Signed   By: Margarette Canada M.D.   On: 04/20/2021 16:23   ? ?Procedures ?Procedures  ? ? ?Medications Ordered in ED ?Medications  ?albuterol (VENTOLIN HFA) 108 (90 Base) MCG/ACT inhaler 2 puff (has no administration in time range)  ?albuterol (PROVENTIL) (2.5 MG/3ML) 0.083% nebulizer solution 2.5 mg (has no administration in time range)  ?albuterol (PROVENTIL) (2.5 MG/3ML) 0.083% nebulizer solution (2.5 mg  Given 04/20/21 1622)  ? ? ?ED Course/ Medical Decision Making/ A&P ?  ?                        ?Medical Decision Making ?Amount and/or Complexity of Data Reviewed ?Labs: ordered. ?Radiology: ordered. ? ?Risk ?Prescription drug management. ? ? ?This patient presents to the ED for concern of shortness of breath, this involves an extensive number of  treatment options, and is a complaint that carries with it a high risk of complications and morbidity.  The differential diagnosis includes but not limited to: COPD exacerbation, ACS, pneumonia, pneumothorax. ? ?Pa

## 2021-04-20 NOTE — Discharge Instructions (Signed)
I ordered a prednisone burst pack, start giving him 40 mg of prednisone for the next 5 days starting tomorrow. ?Call Dr. Verlee Monte office Monday morning to set up an appointment for 1 month.  Hopefully he will be able to get the trilogy to before that appointment, please contact that office for updates. ?Return to the ED if his oxygen requirement increases, he has chest pain, things worsen or change. ?Follow-up with the primary care in the next week if able to for evaluation after ED discharge. ?

## 2021-04-25 DIAGNOSIS — J449 Chronic obstructive pulmonary disease, unspecified: Secondary | ICD-10-CM | POA: Diagnosis not present

## 2021-04-29 ENCOUNTER — Encounter: Payer: Self-pay | Admitting: Nurse Practitioner

## 2021-04-29 ENCOUNTER — Ambulatory Visit: Payer: Medicare Other | Admitting: Nurse Practitioner

## 2021-04-29 VITALS — BP 112/84 | HR 95 | Ht 68.0 in | Wt 221.0 lb

## 2021-04-29 DIAGNOSIS — J9611 Chronic respiratory failure with hypoxia: Secondary | ICD-10-CM

## 2021-04-29 DIAGNOSIS — J9612 Chronic respiratory failure with hypercapnia: Secondary | ICD-10-CM

## 2021-04-29 DIAGNOSIS — J449 Chronic obstructive pulmonary disease, unspecified: Secondary | ICD-10-CM | POA: Diagnosis not present

## 2021-04-29 DIAGNOSIS — Z7189 Other specified counseling: Secondary | ICD-10-CM | POA: Diagnosis not present

## 2021-04-29 NOTE — Assessment & Plan Note (Signed)
Hypercarbic on recent ABG to 58. Discussed changing Trilogy order to BiPAP per family request with Dr. Verlee Monte. Order changed to BiPAP [employer] IPAP 8-12, min 6 EPAP, 4 PS. Use nightly and with naps for CO2 blow off. Continue to use supplemental oxygen 4 lpm POC with activity; order placed for home concentrator to use 4 lpm continuous at night. Goal SpO2 >88-90% ?

## 2021-04-29 NOTE — Assessment & Plan Note (Signed)
Severe COPD. Resolved AECOPD with breathing at baseline. Advised to notify if worsening develops with completion of prednisone. Continue triple therapy management and PRN albuterol. Discussed home exercise and pulmonary exercises with flutter valve and IS. Declined pulm rehab.  ? ?Patient Instructions  ?Continue Breztri 2 puffs Twice daily. Brush tongue and rinse mouth afterwards ?Continue Albuterol inhaler 2 puffs or 3 mL neb every 6 hours as needed for shortness of breath or wheezing. Notify if symptoms persist despite rescue inhaler/neb use. ?Continue lasix as directed by cardiology  ? ?Notify if worsening breathlessness, cough, mucus production, fatigue, or wheezing occurs.  ?Maintain up to date vaccinations, including influenza, COVID, and pneumococcal.  ?Exercise, as tolerated  ? ?Follow up in 4-6 weeks with Dr. Verlee Monte. If symptoms do not improve or worsen, please contact office for sooner follow up or seek emergency care. ? ? ?

## 2021-04-29 NOTE — Addendum Note (Signed)
Addended by: Konrad Felix L on: 04/29/2021 01:55 PM ? ? Modules accepted: Orders ? ?

## 2021-04-29 NOTE — Assessment & Plan Note (Addendum)
Given pt's disease severity and co-morbidities, appropriate to refer to palliative care per family and patient's wishes for further goals of care and advanced care planning. Discussed that they can help with symptom management as well.  ?

## 2021-04-29 NOTE — Progress Notes (Signed)
? ?'@Patient'$  ID: Bryan Hamilton, male    DOB: Mar 08, 1945, 76 y.o.   MRN: [id number] ? ?Chief Complaint  ?Patient presents with  ? Follow-up  ? ? ?Referring provider: ?Celene Squibb, MD ? ?HPI: ?76 year old male, former smoker (80 pack years) followed for severe COPD with asthma and chronic respiratory failure. He is a patient of Dr. Glenetta Hew and last seen in office on 03/20/2021. He is also followed by the lung cancer screening program. Past medical history significant for PAF, HTN, prostate cancer, HLD, depression.  ? ?TEST/EVENTS:  ?09/11/2020 PFTs: FVC 59, FEV1 44, ratio 57, TLC 93, DLCOunc 35. No BD. Severe obstructive airway disease with very severe diffusion defect.  ?10/31/2020 lung cancer screen CT: centrilobular and paraseptal emphysema present on exam. There was dependent atelectasis/scarring in both lung bases as well as RML and lingula. Scattered tiny calcified and noncalcified pulm nodules. There is a dominant noncalcified nodule measuring 4.3 mm. Findings consistent with Lung-RADS 2  ? ?03/20/2021: OV with Dr. Verlee Monte. Progressive DOE and chronic productive cough. Working with Dr. Irish Lack on fluid overload. Changed from Trelegy to [facility]. Continued 4 lpm supplemental O2. Advised to use flutter valve after albuterol tx. Concerned for possible sleep disordered breathing - HST ordered vs hypercarbia from severe COPD. Pt declined pulm rehab ? ?04/29/2021: Today - hospital follow up ?Patient presents today with daughter for follow up after ED visit for increased shortness of breath x 1 month and drowsiness. He was found to be hypercarbic with CO2 58 on ABG. His daughter reports that prior to being seen in the ED, he was falling asleep frequently during the day and didn't seem as alert. This had been ongoing for a while. Today, she reports that this has improved some, but he does still seem to get more drowsy than he used to. His breathing has been stable overall. He does have an occasional productive cough, but it  has improved as well. He does still have some lower extremity swelling but they both inform me that this has also gone down. He's lost around 13 pounds as well. He is currently on 80 mg of lasix Twice daily and followed by cardiology. He denies any orthopnea, PND, or wheezing. He continues on [place] Twice daily and feels like this works well for him. He has not had to use his albuterol inhaler much. He completed his prednisone course prescribed in ED. He has been wearing his POC supplemental O2 24/7 on 4 lpm.  ? ?His daughter inquired about switching his new order from Trilogy vent to BiPAP. Dr. Verlee Monte had written for Trilogy vent at night for CO2 blow off; however, they are able to get home health benefits with use of BiPAP instead of Triology. He and his daughter both inquired about palliative care services and if this would be available for them for goals of care and advanced care planning as well.  ? ?No Known Allergies ? ?Immunization History  ?Administered Date(s) Administered  ? Influenza-Unspecified 12/20/2020  ? ? ?Past Medical History:  ?Diagnosis Date  ? Anxiety   ? Arrhythmia   ? Arthritis   ? Asthma   ? Atrial fibrillation ([place])   ? CHF (congestive heart failure) ([place])   ? COPD (chronic obstructive pulmonary disease) ([place])   ? Depression   ? Diabetes mellitus without complication ([place])   ? tpe 2   ? Dysrhythmia   ? afib   ? GERD (gastroesophageal reflux disease)   ? Hyperchloremia   ?  Hypertension   ? PONV (postoperative nausea and vomiting)   ? Prostate cancer ([place])   ? ? ?Tobacco History: ?Social History  ? ?Tobacco Use  ?Smoking Status Former  ? Packs/day: 2.00  ? Years: 40.00  ? Pack years: 80.00  ? Types: Cigarettes  ? Quit date: 01/21/2008  ? Years since quitting: 13.2  ?Smokeless Tobacco Never  ? ?Counseling given: Not Answered ? ? ?Outpatient Medications Prior to Visit  ?Medication Sig Dispense Refill  ? acetaminophen (TYLENOL) 650 MG CR tablet Take 1,300 mg by mouth 2 (two) times daily as needed  for pain.    ? alfuzosin (UROXATRAL) 10 MG 24 hr tablet Take 1 tablet (10 mg total) by mouth daily with breakfast. 90 tablet 3  ? apixaban (ELIQUIS) 5 MG TABS tablet Take 1 tablet (5 mg total) by mouth 2 (two) times daily. 60 tablet 11  ? atorvastatin (LIPITOR) 40 MG tablet Take 40 mg by mouth daily.    ? Budeson-Glycopyrrol-Formoterol (BREZTRI AEROSPHERE) 160-9-4.8 MCG/ACT AERO Inhale 2 puffs into the lungs in the morning and at bedtime. 5.9 g 0  ? Budeson-Glycopyrrol-Formoterol (BREZTRI AEROSPHERE) 160-9-4.8 MCG/ACT AERO Inhale 2 puffs into the lungs in the morning and at bedtime. 5.9 g 6  ? diclofenac Sodium (VOLTAREN) 1 % GEL Apply 2 g topically daily as needed (Pain).    ? diltiazem (CARDIZEM CD) 180 MG 24 hr capsule Take 1 capsule (180 mg total) by mouth daily. 90 capsule 3  ? furosemide (LASIX) 40 MG tablet 1-2 tablets by mouth twice daily as needed for swelling 180 tablet 3  ? metFORMIN (GLUCOPHAGE-XR) 500 MG 24 hr tablet Take 500 mg by mouth daily.    ? metoprolol tartrate (LOPRESSOR) 50 MG tablet Take 50 mg by mouth 2 (two) times daily.    ? Omega-3 Fatty Acids (OMEGA 3 PO) Take 4 capsules by mouth daily. Last dose on 09/29/19    ? Polyethyl Glycol-Propyl Glycol (SYSTANE) 0.4-0.3 % SOLN Place 1-2 drops into both eyes daily as needed (Dry eye).     ? PROAIR HFA 108 (90 Base) MCG/ACT inhaler Inhale 2 puffs into the lungs every 6 (six) hours as needed for wheezing or shortness of breath.     ? sertraline (ZOLOFT) 25 MG tablet Take 25 mg by mouth at bedtime.     ? ALPRAZolam (XANAX) 0.25 MG tablet Take 0.25 mg by mouth daily as needed for anxiety.  (Patient not taking: Reported on 04/29/2021)    ? predniSONE (DELTASONE) 10 MG tablet Take 2 tablets (20 mg total) by mouth daily. (Patient not taking: Reported on 04/29/2021) 20 tablet 0  ? traMADol (ULTRAM) 50 MG tablet Take 1 tablet (50 mg total) by mouth every 6 (six) hours as needed for moderate pain. (Patient not taking: Reported on 04/29/2021) 12 tablet 0  ?  TRELEGY ELLIPTA 100-62.5-25 MCG/INH AEPB Inhale 1 puff into the lungs daily. (Patient not taking: Reported on 04/29/2021)    ? ?No facility-administered medications prior to visit.  ? ? ? ?Review of Systems:  ? ?Constitutional: No weight loss or gain, night sweats, fevers, chills. +fatigue, drowsiness ?HEENT: No headaches, difficulty swallowing, tooth/dental problems, or sore throat. No sneezing, itching, ear ache, nasal congestion, or post nasal drip ?CV: +swelling in lower extremities. No chest pain, orthopnea, PND, anasarca, dizziness, palpitations, syncope ?Resp: +shortness of breath with exertion (at baseline); occasional productive cough (clear sputum, minimal). No excess mucus or change in color of mucus. No hemoptysis. No wheezing.  No chest wall  deformity ?GI:  No heartburn, indigestion, abdominal pain, nausea, vomiting, diarrhea, change in bowel habits, loss of appetite, bloody stools.  ?Skin: No rash, lesions, ulcerations ?Neuro: No dizziness or lightheadedness.  ?Psych: No depression or anxiety. Mood stable.  ? ? ? ?Physical Exam: ? ?BP 112/84 (BP Location: Left Arm, Cuff Size: Normal)   Pulse 95   Ht '5\' 8"'$  (1.727 m)   Wt 221 lb (100.2 kg)   SpO2 99% Comment: on POC  BMI 33.60 kg/m?  ? ?GEN: Pleasant, interactive, chronically-ill appearing; obese; in no acute distress ?HEENT:  Normocephalic and atraumatic. PERRLA. Sclera white. Nasal turbinates pink, moist and patent bilaterally. No rhinorrhea present. Oropharynx pink and moist, without exudate or edema. No lesions, ulcerations, or postnasal drip.  ?NECK:  Supple w/ fair ROM. No JVD present. Normal carotid impulses w/o bruits. Thyroid symmetrical with no goiter or nodules palpated. No lymphadenopathy.   ?CV: RRR, no m/r/g, +1 BLE edema. Pulses intact, +2 bilaterally. No cyanosis, pallor or clubbing. ?PULMONARY:  Unlabored, regular breathing. Diminished breath sounds bilaterally A&P w/o wheezes/rales/rhonchi. No accessory muscle use. No dullness to  percussion. ?GI: BS present and normoactive. Soft, non-tender to palpation. No organomegaly or masses detected. No CVA tenderness. ?MSK: No erythema, warmth or tenderness. Cap refil <2 sec all extrem. No deformi

## 2021-04-29 NOTE — Patient Instructions (Addendum)
Continue Breztri 2 puffs Twice daily. Brush tongue and rinse mouth afterwards ?Continue Albuterol inhaler 2 puffs or 3 mL neb every 6 hours as needed for shortness of breath or wheezing. Notify if symptoms persist despite rescue inhaler/neb use. ?Continue lasix as directed by cardiology  ? ?Notify if worsening breathlessness, cough, mucus production, fatigue, or wheezing occurs.  ?Maintain up to date vaccinations, including influenza, COVID, and pneumococcal.  ?Exercise, as tolerated  ? ?Follow up in 4-6 weeks with Dr. Verlee Monte. If symptoms do not improve or worsen, please contact office for sooner follow up or seek emergency care. ?

## 2021-04-29 NOTE — Addendum Note (Signed)
Addended by: Konrad Felix L on: 04/29/2021 01:54 PM ? ? Modules accepted: Orders ? ?

## 2021-05-01 ENCOUNTER — Telehealth: Payer: Self-pay | Admitting: Nurse Practitioner

## 2021-05-01 DIAGNOSIS — J9611 Chronic respiratory failure with hypoxia: Secondary | ICD-10-CM

## 2021-05-01 NOTE — Telephone Encounter (Signed)
Hey can you look at the oxygen order that was placed on 04/29/21. Mria the daughter states it has to go to [facility]. And I see the oxygen order is still under PENDING status.  ? ?Please and thank you  ?

## 2021-05-01 NOTE — Telephone Encounter (Signed)
Called and spoke to Bryan Hamilton and she states that the Bipap machine needs to be sent through [facility]. I looked over Bryan Hamilton office note to find the settings that she requested.  ? ?New Bipap order placed under [facility].  ? ?Bryan Hamilton had no further questions or concerns at this time. Nothing further needed  ?

## 2021-05-01 NOTE — Telephone Encounter (Signed)
This encounter was sent to [facility]. Oxygen order was not meant to go to [facility]. It was meant to go to [facility]. DISREGARD this note please. Nothing needed at this time  ?

## 2021-05-03 ENCOUNTER — Telehealth: Payer: Self-pay | Admitting: Nurse Practitioner

## 2021-05-03 NOTE — Telephone Encounter (Signed)
Called and spoke with pt's daughter Verdis Frederickson who states when hospice nurse came out to see pt, they were told by hospice nurse that pt needed to reserve his energy. Verdis Frederickson said that this was totally different than what was stated to them at pt's last OV with [facility] as she told them that pt should order an exercise bike and also light weights to lift to see if that would help pt. ? ? ?Since they have been told different things, Verdis Frederickson is concerned about what needs to be done. Verdis Frederickson also wants to know about how much time Dr. Verlee Monte thinks pt may have left. ? ? ?Verdis Frederickson would like for Dr. Verlee Monte to call her directly at his convenience to discuss all this with her. Dr. Verlee Monte, please advise. ?

## 2021-05-06 NOTE — Telephone Encounter (Signed)
Called and discussed how he's doing with hospice services, NIV, inhalers, advanced care planning etc with daughter Verdis Frederickson. I'm in agreement with his hospice nurse that he has advanced lung disease with chronic hypoxic hypercapnic respiratory failure, requiring supplemental O2 and while we're bad at predicting prognosis in general, I wouldn't be surprised if his life expectancy was on the order of 6 months or less. She says he's not in a place where he's ready 'to just end his life.' She acknowledges that now we're starting to place more value on quality than quantity of life. I'm with Katie Cobb that if he's not miserable doing it then light aerobic exercise a few times a week could potentially improve his quality of life by supporting his independence at home with increased exercise capacity. She asked about using xanax consistently for him - she says he's not frequently anxious and doesn't frequently appear dyspneic to her, only on very specific occasions - prior to certain doctor visits, etc. I said I'd probably only use it in advance of those occasions rather than on a more consistent basis. I said we would speak more about how he's doing with NIV, home exercises, a potential transition to all nebulized and low dose OCS for COPD through hospice as well as his possibly evolving goals of care at our next visit in May. ? ?

## 2021-05-27 NOTE — Progress Notes (Deleted)
Synopsis: Referred for COPD and 'borderline oxygen saturation' by Celene Squibb, MD  Subjective:   PATIENT ID: Bryan Hamilton GENDER: male DOB: Nov 23, 1945, MRN: [id number]  No chief complaint on file.     23yM with history of COPD on trelegy, asthma, AF on eliquis, GERD who is referred for COPD and 'borderline oxygen saturation.'  He has had increasing DOE. Can only walk about 50 yards due to [place]. Over the last 3 months is when he's had this decline - at that point he thinks he could have walked about 100 yards. He doesn't have much of a cough, does feel chest congestion but can't quite bring it up. Currently using albuterol nebulizer 1-2 times daily, albuterol mdi 2-3x daily but doesn't notice much improvement with them. Uses trelegy daily for the last 4 years and does believe it was helpful.  No hospitalizations this year due to COPD or ever. He has received no prescriptions for steroids this year for COPD. Last year got a course of steroids but didn't think it was very helpful wrt dyspnea.   Had ED trip last week due to RLE swelling. Thought there to have cellulitis.  Never has had sleep study  Previous inhalers:  Unsure what he's tried other than trelegy and albuterol   Dad had COPD. Mom had cancer, unsure if lung cancer.   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 Has dog at home. Never has lived outside of [place].   Interval HPI: Started on NIV for chronic hypercapnic respiratory failure.   Otherwise pertinent review of systems is negative.  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    Dysrhythmia    afib    GERD (gastroesophageal reflux disease)    Hyperchloremia    Hypertension    PONV  (postoperative nausea and vomiting)    Prostate cancer ([place])      Family History  Problem Relation Age of Onset   Cancer Father        type unknown   Breast cancer Neg Hx    Prostate cancer Neg Hx    Colon cancer Neg Hx    Pancreatic cancer Neg Hx      Past Surgical History:  Procedure Laterality Date   BACK SURGERY     CYSTOSCOPY N/A 10/14/2019   Procedure: CYSTOSCOPY FLEXIBLE;  Surgeon: Cleon Gustin, MD;  Location: [facility];  Service: Urology;  Laterality: N/A;  NO SEEDS FOUND IN BLADDER   PROSTATE BIOPSY N/A 07/20/2019   Procedure: PROSTATE BIOPSY;  Surgeon: Cleon Gustin, MD;  Location: [facility];  Service: Urology;  Laterality: N/A;   RADIOACTIVE SEED IMPLANT N/A 10/14/2019   Procedure: RADIOACTIVE SEED IMPLANT/BRACHYTHERAPY IMPLANT;  Surgeon: Cleon Gustin, MD;  Location: [facility];  Service: Urology;  Laterality: N/A;    67  SEEDS IMPLANTED   SPACE OAR INSTILLATION N/A 10/14/2019   Procedure: SPACE OAR INSTILLATION;  Surgeon: Cleon Gustin, MD;  Location: [facility];  Service: Urology;  Laterality: N/A;    Social History   Socioeconomic History   Marital status: Married    Spouse name:  Not on file   Number of children: 2   Years of education: 14   Highest education level: Not on file  Occupational History   Not on file  Tobacco Use   Smoking status: Former    Packs/day: 2.00    Years: 40.00    Pack years: 80.00    Types: Cigarettes    Quit date: 01/21/2008    Years since quitting: 13.3   Smokeless tobacco: Never  Vaping Use   Vaping Use: Never used  Substance and Sexual Activity   Alcohol use: Not Currently    Comment: rarely    Drug use: Never   Sexual activity: Not Currently  Other Topics Concern   Not on file  Social History Narrative   Not on file   Social Determinants of Health   Financial Resource Strain: Not on file  Food Insecurity: Not on file  Transportation Needs: Not on  file  Physical Activity: Not on file  Stress: Not on file  Social Connections: Not on file  Intimate Partner Violence: Not on file     No Known Allergies   Outpatient Medications Prior to Visit  Medication Sig Dispense Refill   acetaminophen (TYLENOL) 650 MG CR tablet Take 1,300 mg by mouth 2 (two) times daily as needed for pain.     alfuzosin (UROXATRAL) 10 MG 24 hr tablet Take 1 tablet (10 mg total) by mouth daily with breakfast. 90 tablet 3   ALPRAZolam (XANAX) 0.25 MG tablet Take 0.25 mg by mouth daily as needed for anxiety.  (Patient not taking: Reported on 04/29/2021)     apixaban (ELIQUIS) 5 MG TABS tablet Take 1 tablet (5 mg total) by mouth 2 (two) times daily. 60 tablet 11   atorvastatin (LIPITOR) 40 MG tablet Take 40 mg by mouth daily.     Budeson-Glycopyrrol-Formoterol (BREZTRI AEROSPHERE) 160-9-4.8 MCG/ACT AERO Inhale 2 puffs into the lungs in the morning and at bedtime. 5.9 g 0   Budeson-Glycopyrrol-Formoterol (BREZTRI AEROSPHERE) 160-9-4.8 MCG/ACT AERO Inhale 2 puffs into the lungs in the morning and at bedtime. 5.9 g 6   diclofenac Sodium (VOLTAREN) 1 % GEL Apply 2 g topically daily as needed (Pain).     diltiazem (CARDIZEM CD) 180 MG 24 hr capsule Take 1 capsule (180 mg total) by mouth daily. 90 capsule 3   furosemide (LASIX) 40 MG tablet 1-2 tablets by mouth twice daily as needed for swelling 180 tablet 3   metFORMIN (GLUCOPHAGE-XR) 500 MG 24 hr tablet Take 500 mg by mouth daily.     metoprolol tartrate (LOPRESSOR) 50 MG tablet Take 50 mg by mouth 2 (two) times daily.     Omega-3 Fatty Acids (OMEGA 3 PO) Take 4 capsules by mouth daily. Last dose on 09/29/19     Polyethyl Glycol-Propyl Glycol (SYSTANE) 0.4-0.3 % SOLN Place 1-2 drops into both eyes daily as needed (Dry eye).      PROAIR HFA 108 (90 Base) MCG/ACT inhaler Inhale 2 puffs into the lungs every 6 (six) hours as needed for wheezing or shortness of breath.      sertraline (ZOLOFT) 25 MG tablet Take 25 mg by mouth at  bedtime.      No facility-administered medications prior to visit.       Objective:   Physical Exam:  General appearance: 76 y.o., male, NAD, conversant  Eyes: anicteric sclerae, moist conjunctivae; no lid-lag; PERRL, tracking appropriately HENT: NCAT; oropharynx, MMM, no mucosal ulcerations; normal hard and soft palate Neck: Trachea midline; no  lymphadenopathy, no JVD Lungs: exp wheeze bl, with normal respiratory effort CV: RRR, no MRGs  Abdomen: Soft, non-tender; non-distended, BS present  Extremities: trace BLE R>L edema, radial and DP pulses present bilaterally  Skin: Normal temperature, turgor and texture; venous stasis changes over BLE R>L Psych: Appropriate affect Neuro: Alert and oriented to person and place, no focal deficit    There were no vitals filed for this visit.     on 4L O2 BMI Readings from Last 3 Encounters:  04/29/21 33.60 kg/m  04/20/21 34.97 kg/m  03/20/21 34.06 kg/m   Wt Readings from Last 3 Encounters:  04/29/21 221 lb (100.2 kg)  04/20/21 230 lb (104.3 kg)  03/20/21 224 lb (101.6 kg)     CBC    Component Value Date/Time   WBC 7.5 04/20/2021 1616   RBC 4.12 (L) 04/20/2021 1616   HGB 11.9 (L) 04/20/2021 1616   HGB 12.8 (L) 02/22/2021 1222   HCT 38.8 (L) 04/20/2021 1616   HCT 39.0 02/22/2021 1222   PLT 209 04/20/2021 1616   PLT 264 02/22/2021 1222   MCV 94.2 04/20/2021 1616   MCV 90 02/22/2021 1222   MCH 28.9 04/20/2021 1616   MCHC 30.7 04/20/2021 1616   RDW 15.9 (H) 04/20/2021 1616   RDW 14.9 02/22/2021 1222   LYMPHSABS 0.6 (L) 04/20/2021 1616   MONOABS 0.8 04/20/2021 1616   EOSABS 0.3 04/20/2021 1616   BASOSABS 0.1 04/20/2021 1616    06/15/20 QTc 399  Chest Imaging: CXR 08/23/20 reviewed by me and remarkable for prominent vascular markings   CT A/p 08/23/20 with RML, lingula scarring, posterior/dependent atelectasis  CT Lung screen 10/31/20 reviewed by me with subtle emphysema, mucus plugging, lower lobe/RML, lingula  scarring, dominant nodule 72m  CXR 04/20/21 reviewed by me with prominent basilar interstitial opacities  Pulmonary Functions Testing Results:    Latest Ref Rng & Units 09/11/2020   10:30 AM  PFT Results  FVC-Pre L 2.27    FVC-Predicted Pre % 59    FVC-Post L 2.15    FVC-Predicted Post % 56    Pre FEV1/FVC % % 52    Post FEV1/FCV % % 57    FEV1-Pre L 1.18    FEV1-Predicted Pre % 43    FEV1-Post L 1.22    DLCO uncorrected ml/min/mmHg 8.33    DLCO UNC% % 35    DLVA Predicted % 56    TLC L 6.23    TLC % Predicted % 93    RV % Predicted % 152     Severe obstruction, air trapping, severely reduced DLCO which may be underestimate due to failure to reach 90% IVC during maneuver   Echocardiogram:   TTE 05/13/19:  1. Left ventricular ejection fraction, by estimation, is 60 to 65%. The  left ventricle has normal function. Left ventricular endocardial border  not optimally defined to evaluate regional wall motion. There is mild left  ventricular hypertrophy. Left  ventricular diastolic parameters are indeterminate.   2. Right ventricular systolic function is normal. The right ventricular  size is mildly enlarged. There is moderately elevated pulmonary artery  systolic pressure.   3. Left atrial size was mildly dilated.   4. The mitral valve is normal in structure. No evidence of mitral valve  regurgitation. No evidence of mitral stenosis.   5. The aortic valve is tricuspid. Aortic valve regurgitation is not  visualized. No aortic stenosis is present.   6. The inferior vena cava is normal in size with  greater than 50%  respiratory variability, suggesting right atrial pressure of 3 mmHg.    Heart Catheterization: None    Assessment & Plan:    # COPD gold functional group B: Very limited extent of emphysema on CT. He's not an exacerbator by history but does have chronic productive cough. Still pretty wheezy on exam today, I think he also minimizes his symptoms.  # Chronic  hypoxemic respiratory failure: On 4L O2 per [place] continuous at rest, during exertion, and sleep based on walking oximetry today in the office  # Suspected sleep-disordered breathing:   Plan: - trial breztri 2 puffs twice daily with spacer - instructed how to use, he'll let us know in a month if prefers it for his COPD - albuterol neb or inhaler prn - can use flutter valve 10 slow but firm puffs twice daily after albuterol treatment if productive cough - home sleep study - declines pulmonary rehab referral   RTC 6 months  Maryjane Hurter, MD [facility] 05/27/2021 7:59 PM

## 2021-05-29 ENCOUNTER — Ambulatory Visit: Payer: Medicare Other | Admitting: Student

## 2021-06-10 ENCOUNTER — Other Ambulatory Visit (HOSPITAL_COMMUNITY)
Admission: RE | Admit: 2021-06-10 | Discharge: 2021-06-10 | Disposition: A | Discharge status: Home / Self Care | Source: Other Acute Inpatient Hospital | Attending: Adult Health Nurse Practitioner | Admitting: Adult Health Nurse Practitioner

## 2021-06-10 DIAGNOSIS — I509 Heart failure, unspecified: Secondary | ICD-10-CM | POA: Insufficient documentation

## 2021-06-10 LAB — COMPREHENSIVE METABOLIC PANEL
ALT: 17 U/L (ref 0–44)
AST: 17 U/L (ref 15–41)
Albumin: 3.6 g/dL (ref 3.5–5.0)
Alkaline Phosphatase: 90 U/L (ref 38–126)
Anion gap: 11 (ref 5–15)
BUN: 17 mg/dL (ref 8–23)
CO2: 36 mmol/L — ABNORMAL HIGH (ref 22–32)
Calcium: 8.4 mg/dL — ABNORMAL LOW (ref 8.9–10.3)
Chloride: 87 mmol/L — ABNORMAL LOW (ref 98–111)
Creatinine, Ser: 1.33 mg/dL — ABNORMAL HIGH (ref 0.61–1.24)
GFR, Estimated: 55 mL/min — ABNORMAL LOW (ref >60)
Glucose, Bld: 238 mg/dL — ABNORMAL HIGH (ref 70–99)
Potassium: 2.6 mmol/L — CL (ref 3.5–5.1)
Sodium: 134 mmol/L — ABNORMAL LOW (ref 135–145)
Total Bilirubin: 0.6 mg/dL (ref 0.3–1.2)
Total Protein: 7.2 g/dL (ref 6.5–8.1)

## 2021-07-03 ENCOUNTER — Other Ambulatory Visit: Payer: Medicare Other

## 2021-07-10 ENCOUNTER — Ambulatory Visit: Payer: Medicare Other | Admitting: Urology

## 2021-07-20 DEATH — deceased

## 2022-08-28 IMAGING — DX DG CHEST 1V PORT
1 series · 1 of 1 positions shown · non-contrast
Comparison: 08/23/2020

CLINICAL DATA: Atrial fibrillation with rapid ventricular response,
tachycardia, weakness

EXAM:
PORTABLE CHEST 1 VIEW

[chest ap]
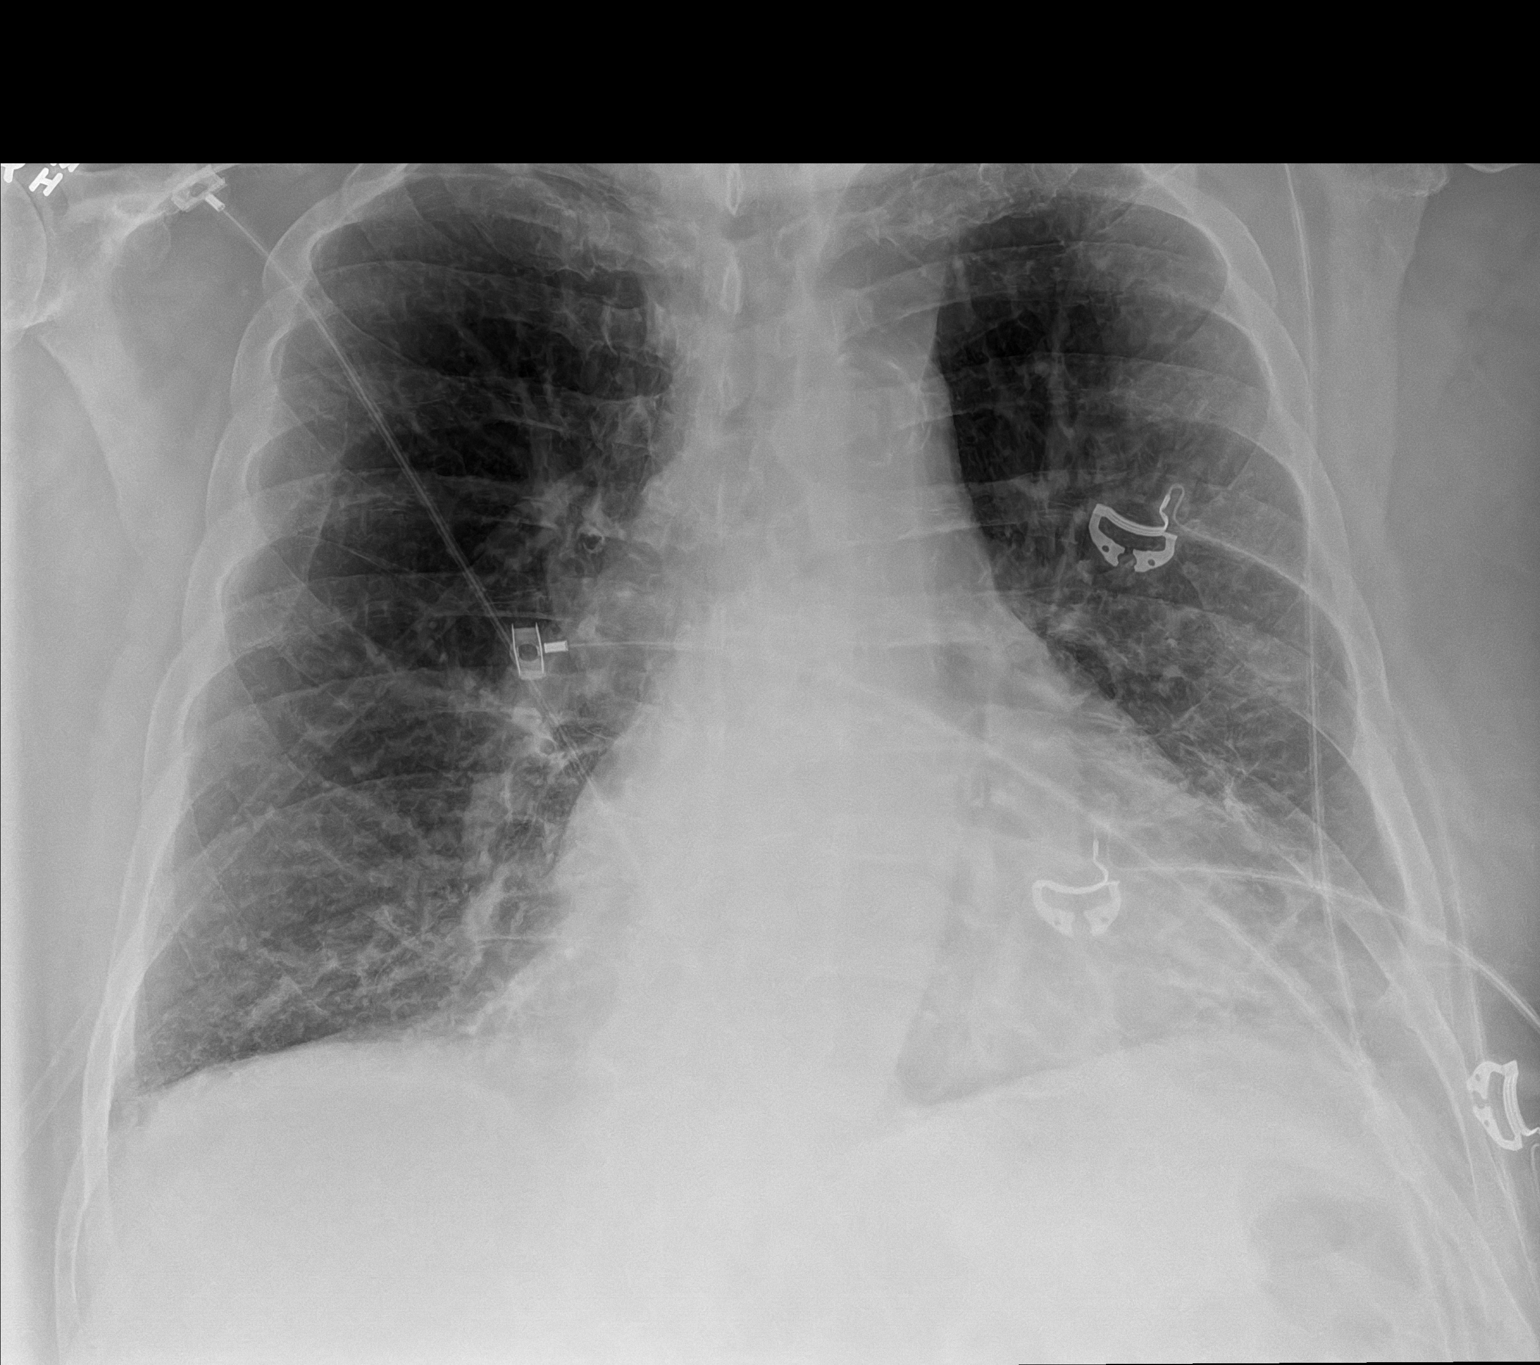

[1 of 1 positions shown; findings below may reference images not displayed]

FINDINGS: Single frontal view of the chest demonstrates a stable cardiac
silhouette. Chronic central vascular congestion. Background
emphysema without airspace disease, effusion, or pneumothorax. No
acute bony abnormalities.
IMPRESSION: 1. Emphysema.
2. Chronic central vascular congestion without overt edema.
